# Patient Record
Sex: Male | Born: 1970
Health system: Southern US, Community
[De-identification: ages and names within clinical notes are randomized; demographics above are authoritative.]

## PROBLEM LIST (undated history)

## (undated) DIAGNOSIS — I428 Other cardiomyopathies: Secondary | ICD-10-CM

## (undated) DIAGNOSIS — G4730 Sleep apnea, unspecified: Secondary | ICD-10-CM

## (undated) DIAGNOSIS — E119 Type 2 diabetes mellitus without complications: Secondary | ICD-10-CM

## (undated) DIAGNOSIS — I5022 Chronic systolic (congestive) heart failure: Secondary | ICD-10-CM

## (undated) DIAGNOSIS — E785 Hyperlipidemia, unspecified: Secondary | ICD-10-CM

## (undated) DIAGNOSIS — I10 Essential (primary) hypertension: Secondary | ICD-10-CM

## (undated) HISTORY — DX: Other cardiomyopathies: I42.8

## (undated) HISTORY — DX: Chronic systolic (congestive) heart failure: I50.22

---

## 2014-04-20 DIAGNOSIS — Z86718 Personal history of other venous thrombosis and embolism: Secondary | ICD-10-CM | POA: Insufficient documentation

## 2014-05-20 DIAGNOSIS — I8393 Asymptomatic varicose veins of bilateral lower extremities: Secondary | ICD-10-CM | POA: Insufficient documentation

## 2018-11-23 ENCOUNTER — Emergency Department (HOSPITAL_BASED_OUTPATIENT_CLINIC_OR_DEPARTMENT_OTHER): Payer: BLUE CROSS/BLUE SHIELD

## 2018-11-23 ENCOUNTER — Emergency Department (HOSPITAL_COMMUNITY)
Admission: EM | Admit: 2018-11-23 | Discharge: 2018-11-23 | Disposition: A | Discharge status: Home / Self Care | Payer: BLUE CROSS/BLUE SHIELD | Attending: Emergency Medicine | Admitting: Emergency Medicine

## 2018-11-23 DIAGNOSIS — E119 Type 2 diabetes mellitus without complications: Secondary | ICD-10-CM | POA: Insufficient documentation

## 2018-11-23 DIAGNOSIS — R600 Localized edema: Secondary | ICD-10-CM | POA: Diagnosis not present

## 2018-11-23 DIAGNOSIS — M79604 Pain in right leg: Secondary | ICD-10-CM | POA: Insufficient documentation

## 2018-11-23 DIAGNOSIS — I10 Essential (primary) hypertension: Secondary | ICD-10-CM | POA: Diagnosis not present

## 2018-11-23 DIAGNOSIS — M7989 Other specified soft tissue disorders: Secondary | ICD-10-CM

## 2018-11-23 NOTE — Discharge Instructions (Signed)
Dear Gary Jacobs  You came to [place] with right leg pain. We have determined this was caused by varicose vein and trauma to the leg. Here are our recommendations for you at discharge:  Please follow up with your primary care provider. Consider wearing compression stockings to reduce your swelling.  Thank you for choosing [facility].

## 2018-11-23 NOTE — Progress Notes (Signed)
Right lower extremity venous duplex completed.  Refer to "CV Proc" under chart review to view preliminary results.  11/23/2018 4:14 PM Gertie Fey, MHA, RVT, RDCS, RDMS

## 2018-11-23 NOTE — ED Provider Notes (Signed)
[facility] EMERGENCY DEPARTMENT Provider Note   CSN: [id number] Arrival date & time: 11/23/18  1511    History   Chief Complaint Chief Complaint  Patient presents with  . Leg Pain    HPI Gary Jacobs is a 48 y.o. male w/ PMH of T2DM, Obesity, HTN and hx of DVT presenting with left leg pain. He states he was in his usual state of health until 2 weeks ago when he began to have left leg pain after bumping it on furniture. He states the pain was not significant but he was concerned due to the swelling not decreasing after 2 weeks as he had a history of DVT 7 years ago with similar presentation. He states he was on couple months of Coumadin at that time but is no longer on anticoagulation. 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 Lives in [place] but operates out of [place]. Denies any smoking history.    No past medical history on file.  There are no active problems to display for this patient.   Home Medications    Prior to Admission medications   Not on File   Family History No family history on file.  Social History Social History   Tobacco Use  . Smoking status: Not on file  Substance Use Topics  . Alcohol use: Not on file  . Drug use: Not on file     Allergies   Patient has no allergy information on record.   Review of Systems Review of Systems  Constitutional: Negative for chills, fatigue and fever.  Respiratory: Negative for cough, chest tightness and shortness of breath.   Cardiovascular: Positive for leg swelling. Negative for chest pain and palpitations.  Gastrointestinal: Negative for constipation, diarrhea, nausea and vomiting.  Genitourinary: Negative for dysuria, frequency and urgency.  Musculoskeletal: Negative for joint swelling.  Neurological: Negative for dizziness, weakness, light-headedness, numbness and headaches.     Physical Exam Updated Vital Signs BP 117/83   Pulse 90    Temp 97.8 F (36.6 C) (Oral)   Resp 16   SpO2 98%   Physical Exam Constitutional:      General: He is not in acute distress.    Appearance: He is obese.  HENT:     Head: Normocephalic and atraumatic.     Nose: Nose normal. No congestion or rhinorrhea.     Mouth/Throat:     Mouth: Mucous membranes are moist.     Pharynx: Oropharynx is clear. No oropharyngeal exudate.  Eyes:     Extraocular Movements: Extraocular movements intact.     Conjunctiva/sclera: Conjunctivae normal.     Pupils: Pupils are equal, round, and reactive to light.  Cardiovascular:     Rate and Rhythm: Normal rate and regular rhythm.     Pulses: Normal pulses.     Heart sounds: Normal heart sounds. No murmur.  Pulmonary:     Effort: Pulmonary effort is normal.     Breath sounds: Normal breath sounds. No wheezing or rales.  Abdominal:     General: Abdomen is flat. Bowel sounds are normal. There is no distension.     Palpations: Abdomen is soft.     Tenderness: There is no abdominal tenderness.  Musculoskeletal: Normal range of motion.     Right lower leg: Edema (1+ to mid shin, + varicose veins, significantly larger in diameter than left lower extremity) present.     Left lower leg: No edema.  Skin:  General: Skin is warm and dry.  Neurological:     Mental Status: He is alert.      ED Treatments / Results  Labs (all labs ordered are listed, but only abnormal results are displayed) Labs Reviewed - No data to display  EKG None  Radiology Vas [place] Lower Extremity Venous (dvt) (only [facility])  Result Date: 11/23/2018  Lower Venous Study Indications: Swelling.  Performing Technologist: Gertie Fey MHA, RDMS, RVT, RDCS  Examination Guidelines: A complete evaluation includes B-mode imaging, spectral Doppler, color Doppler, and power Doppler as needed of all accessible portions of each vessel. Bilateral testing is considered an integral part of a complete examination. Limited examinations for  reoccurring indications may be performed as noted.  Right Venous Findings: +---------+---------------+---------+-----------+----------+--------------+          CompressibilityPhasicitySpontaneityPropertiesSummary        +---------+---------------+---------+-----------+----------+--------------+ CFV      Full                                                        +---------+---------------+---------+-----------+----------+--------------+ SFJ      Full                                                        +---------+---------------+---------+-----------+----------+--------------+ FV Prox  Full                                                        +---------+---------------+---------+-----------+----------+--------------+ FV Mid   Full                                                        +---------+---------------+---------+-----------+----------+--------------+ FV DistalFull                                                        +---------+---------------+---------+-----------+----------+--------------+ POP      Full                                                        +---------+---------------+---------+-----------+----------+--------------+ PTV      Full                                                        +---------+---------------+---------+-----------+----------+--------------+ PERO  Not visualized +---------+---------------+---------+-----------+----------+--------------+ Varicose veins were visualized in the proximal medial calf without evidence of thrombosis.  Summary: Right: There is no evidence of deep vein thrombosis in the lower extremity. However, portions of this examination were limited- see technologist comments above. No cystic structure found in the popliteal fossa.  *See table(s) above for measurements and observations. Electronically signed by Sherald Hess MD on 11/23/2018 at  4:29:49 PM.    Final     Procedures Procedures (including critical care time)  Medications Ordered in ED Medications - No data to display   Initial Impression / Assessment and Plan / ED Course  I have reviewed the triage vital signs and the nursing notes.  Pertinent labs & imaging results that were available during my care of the patient were reviewed by me and considered in my medical decision making (see chart for details).    Gary Jacobs is a 48 yo M w/ hx of prior DVT presenting with unilateral leg swelling. High risk for DVT (Wells score: 4)due to prior hx and occupation as Naval architect. Other differential includes soft-tissue injury due to trauma and/or varicose veins. F/u with DVT ultrasound  Final Clinical Impressions(s) / ED Diagnoses   Final diagnoses:  Right leg pain    Lower extremity doppler showing no DVT. Unilateral swelling can be attributed to soft tissue injury after trauma with chronic varicose veins. Will discharge home.  ED Discharge Orders    None       Theotis Barrio, MD 11/23/18 1815    Tilden Fossa, MD 11/24/18 [id number]

## 2018-11-23 NOTE — ED Triage Notes (Signed)
Pt here from home with c/o right lower leg pain pt has had a clot in that calf in the past , no pain , leg is warm

## 2019-07-31 ENCOUNTER — Inpatient Hospital Stay (HOSPITAL_COMMUNITY)
Admission: EM | Admit: 2019-07-31 | Discharge: 2019-08-06 | DRG: 286 | Disposition: A | Discharge status: Home / Self Care | Payer: BC Managed Care – PPO | Attending: Internal Medicine | Admitting: Internal Medicine

## 2019-07-31 ENCOUNTER — Other Ambulatory Visit: Payer: Self-pay

## 2019-07-31 ENCOUNTER — Encounter (HOSPITAL_COMMUNITY): Payer: Self-pay

## 2019-07-31 ENCOUNTER — Emergency Department (HOSPITAL_COMMUNITY): Payer: BC Managed Care – PPO

## 2019-07-31 DIAGNOSIS — I10 Essential (primary) hypertension: Secondary | ICD-10-CM | POA: Diagnosis not present

## 2019-07-31 DIAGNOSIS — E785 Hyperlipidemia, unspecified: Secondary | ICD-10-CM | POA: Diagnosis present

## 2019-07-31 DIAGNOSIS — R739 Hyperglycemia, unspecified: Secondary | ICD-10-CM

## 2019-07-31 DIAGNOSIS — I5023 Acute on chronic systolic (congestive) heart failure: Secondary | ICD-10-CM | POA: Diagnosis present

## 2019-07-31 DIAGNOSIS — I2510 Atherosclerotic heart disease of native coronary artery without angina pectoris: Secondary | ICD-10-CM | POA: Diagnosis present

## 2019-07-31 DIAGNOSIS — Z79899 Other long term (current) drug therapy: Secondary | ICD-10-CM | POA: Diagnosis not present

## 2019-07-31 DIAGNOSIS — N189 Chronic kidney disease, unspecified: Secondary | ICD-10-CM | POA: Diagnosis present

## 2019-07-31 DIAGNOSIS — Z7984 Long term (current) use of oral hypoglycemic drugs: Secondary | ICD-10-CM

## 2019-07-31 DIAGNOSIS — Z86718 Personal history of other venous thrombosis and embolism: Secondary | ICD-10-CM | POA: Diagnosis not present

## 2019-07-31 DIAGNOSIS — I82401 Acute embolism and thrombosis of unspecified deep veins of right lower extremity: Secondary | ICD-10-CM | POA: Diagnosis not present

## 2019-07-31 DIAGNOSIS — G4733 Obstructive sleep apnea (adult) (pediatric): Secondary | ICD-10-CM | POA: Diagnosis present

## 2019-07-31 DIAGNOSIS — I5021 Acute systolic (congestive) heart failure: Secondary | ICD-10-CM | POA: Diagnosis not present

## 2019-07-31 DIAGNOSIS — I130 Hypertensive heart and chronic kidney disease with heart failure and stage 1 through stage 4 chronic kidney disease, or unspecified chronic kidney disease: Principal | ICD-10-CM | POA: Diagnosis present

## 2019-07-31 DIAGNOSIS — Z6841 Body Mass Index (BMI) 40.0 and over, adult: Secondary | ICD-10-CM

## 2019-07-31 DIAGNOSIS — I361 Nonrheumatic tricuspid (valve) insufficiency: Secondary | ICD-10-CM | POA: Diagnosis not present

## 2019-07-31 DIAGNOSIS — R7989 Other specified abnormal findings of blood chemistry: Secondary | ICD-10-CM | POA: Diagnosis not present

## 2019-07-31 DIAGNOSIS — R778 Other specified abnormalities of plasma proteins: Secondary | ICD-10-CM | POA: Diagnosis not present

## 2019-07-31 DIAGNOSIS — I509 Heart failure, unspecified: Secondary | ICD-10-CM

## 2019-07-31 DIAGNOSIS — I083 Combined rheumatic disorders of mitral, aortic and tricuspid valves: Secondary | ICD-10-CM | POA: Diagnosis present

## 2019-07-31 DIAGNOSIS — Z20828 Contact with and (suspected) exposure to other viral communicable diseases: Secondary | ICD-10-CM | POA: Diagnosis present

## 2019-07-31 DIAGNOSIS — G4730 Sleep apnea, unspecified: Secondary | ICD-10-CM

## 2019-07-31 DIAGNOSIS — E1169 Type 2 diabetes mellitus with other specified complication: Secondary | ICD-10-CM

## 2019-07-31 DIAGNOSIS — E1122 Type 2 diabetes mellitus with diabetic chronic kidney disease: Secondary | ICD-10-CM | POA: Diagnosis present

## 2019-07-31 DIAGNOSIS — I248 Other forms of acute ischemic heart disease: Secondary | ICD-10-CM | POA: Diagnosis present

## 2019-07-31 DIAGNOSIS — N179 Acute kidney failure, unspecified: Secondary | ICD-10-CM | POA: Diagnosis present

## 2019-07-31 DIAGNOSIS — M7989 Other specified soft tissue disorders: Secondary | ICD-10-CM | POA: Diagnosis not present

## 2019-07-31 DIAGNOSIS — G4739 Other sleep apnea: Secondary | ICD-10-CM | POA: Diagnosis not present

## 2019-07-31 DIAGNOSIS — R0602 Shortness of breath: Secondary | ICD-10-CM | POA: Diagnosis not present

## 2019-07-31 DIAGNOSIS — Z8249 Family history of ischemic heart disease and other diseases of the circulatory system: Secondary | ICD-10-CM

## 2019-07-31 DIAGNOSIS — I340 Nonrheumatic mitral (valve) insufficiency: Secondary | ICD-10-CM | POA: Diagnosis not present

## 2019-07-31 DIAGNOSIS — E1165 Type 2 diabetes mellitus with hyperglycemia: Secondary | ICD-10-CM | POA: Diagnosis present

## 2019-07-31 HISTORY — DX: Essential (primary) hypertension: I10

## 2019-07-31 HISTORY — DX: Hyperlipidemia, unspecified: E78.5

## 2019-07-31 HISTORY — DX: Type 2 diabetes mellitus without complications: E11.9

## 2019-07-31 HISTORY — DX: Sleep apnea, unspecified: G47.30

## 2019-07-31 LAB — CBC
HCT: 41.3 % (ref 39.0–52.0)
HCT: 42 % (ref 39.0–52.0)
Hemoglobin: 12.7 g/dL — ABNORMAL LOW (ref 13.0–17.0)
Hemoglobin: 13 g/dL (ref 13.0–17.0)
MCH: 26.8 pg (ref 26.0–34.0)
MCH: 26.5 pg (ref 26.0–34.0)
MCHC: 30.8 g/dL (ref 30.0–36.0)
MCHC: 31 g/dL (ref 30.0–36.0)
MCV: 87.1 fL (ref 80.0–100.0)
MCV: 85.7 fL (ref 80.0–100.0)
Platelets: 248 10*3/uL (ref 150–400)
Platelets: 243 10*3/uL (ref 150–400)
RBC: 4.74 MIL/uL (ref 4.22–5.81)
RBC: 4.9 MIL/uL (ref 4.22–5.81)
RDW: 15.5 % (ref 11.5–15.5)
RDW: 15.6 % — ABNORMAL HIGH (ref 11.5–15.5)
WBC: 6.5 10*3/uL (ref 4.0–10.5)
WBC: 7.9 10*3/uL (ref 4.0–10.5)
nRBC: 0 % (ref 0.0–0.2)
nRBC: 0 % (ref 0.0–0.2)

## 2019-07-31 LAB — BASIC METABOLIC PANEL
Anion gap: 10 (ref 5–15)
BUN: 15 mg/dL (ref 6–20)
CO2: 25 mmol/L (ref 22–32)
Calcium: 9.1 mg/dL (ref 8.9–10.3)
Chloride: 102 mmol/L (ref 98–111)
Creatinine, Ser: 1.62 mg/dL — ABNORMAL HIGH (ref 0.61–1.24)
GFR calc Af Amer: 58 mL/min — ABNORMAL LOW (ref >60)
GFR calc non Af Amer: 50 mL/min — ABNORMAL LOW (ref >60)
Glucose, Bld: 282 mg/dL — ABNORMAL HIGH (ref 70–99)
Potassium: 4.7 mmol/L (ref 3.5–5.1)
Sodium: 137 mmol/L (ref 135–145)

## 2019-07-31 LAB — TROPONIN I (HIGH SENSITIVITY)
Troponin I (High Sensitivity): 186 ng/L (ref <18)
Troponin I (High Sensitivity): 169 ng/L (ref <18)

## 2019-07-31 LAB — SARS CORONAVIRUS 2 (TAT 6-24 HRS): SARS Coronavirus 2: NEGATIVE

## 2019-07-31 LAB — BRAIN NATRIURETIC PEPTIDE: B Natriuretic Peptide: 549 pg/mL — ABNORMAL HIGH (ref 0.0–100.0)

## 2019-07-31 MED ORDER — IOHEXOL 350 MG/ML SOLN
100.0000 mL | Freq: Once | INTRAVENOUS | Status: AC | PRN
  Administered 2019-07-31: 100 mL via INTRAVENOUS

## 2019-07-31 MED ORDER — SODIUM CHLORIDE 0.9% FLUSH
3.0000 mL | Freq: Once | INTRAVENOUS | Status: AC
  Administered 2019-07-31: 3 mL via INTRAVENOUS

## 2019-07-31 MED ORDER — ALBUTEROL SULFATE (2.5 MG/3ML) 0.083% IN NEBU: 5.0000 mg | INHALATION_SOLUTION | Freq: Once | RESPIRATORY_TRACT | Status: DC

## 2019-07-31 MED ORDER — SODIUM CHLORIDE 0.9 % IV SOLN: 250.0000 mL | INTRAVENOUS | Status: DC | PRN

## 2019-07-31 MED ORDER — FUROSEMIDE 10 MG/ML IJ SOLN
20.0000 mg | Freq: Once | INTRAMUSCULAR | Status: AC
  Administered 2019-07-31: 20 mg via INTRAVENOUS
  Filled 2019-07-31: qty 2

## 2019-07-31 MED ORDER — ENOXAPARIN SODIUM 80 MG/0.8ML ~~LOC~~ SOLN
80.0000 mg | SUBCUTANEOUS | Status: DC
  Administered 2019-08-01 – 2019-08-03 (×3): 80 mg via SUBCUTANEOUS
  Filled 2019-07-31 (×4): qty 0.8

## 2019-07-31 MED ORDER — SODIUM CHLORIDE 0.9% FLUSH
3.0000 mL | Freq: Two times a day (BID) | INTRAVENOUS | Status: DC
  Administered 2019-07-31 – 2019-08-03 (×6): 3 mL via INTRAVENOUS

## 2019-07-31 MED ORDER — ASPIRIN EC 81 MG PO TBEC
81.0000 mg | DELAYED_RELEASE_TABLET | Freq: Every day | ORAL | Status: DC
  Administered 2019-07-31 – 2019-08-06 (×7): 81 mg via ORAL
  Filled 2019-07-31 (×7): qty 1

## 2019-07-31 MED ORDER — SODIUM CHLORIDE 0.9% FLUSH: 3.0000 mL | INTRAVENOUS | Status: DC | PRN

## 2019-07-31 MED ORDER — LISINOPRIL 10 MG PO TABS
10.0000 mg | ORAL_TABLET | Freq: Every day | ORAL | Status: DC
  Administered 2019-07-31 – 2019-08-02 (×3): 10 mg via ORAL
  Filled 2019-07-31 (×3): qty 1

## 2019-07-31 MED ORDER — FUROSEMIDE 10 MG/ML IJ SOLN
40.0000 mg | Freq: Two times a day (BID) | INTRAMUSCULAR | Status: DC
  Administered 2019-08-01: 08:00:00 40 mg via INTRAVENOUS
  Filled 2019-07-31: qty 4

## 2019-07-31 MED ORDER — ACETAMINOPHEN 325 MG PO TABS: 650.0000 mg | ORAL_TABLET | ORAL | Status: DC | PRN

## 2019-07-31 MED ORDER — ONDANSETRON HCL 4 MG/2ML IJ SOLN: 4.0000 mg | Freq: Four times a day (QID) | INTRAMUSCULAR | Status: DC | PRN

## 2019-07-31 MED ORDER — ENOXAPARIN SODIUM 40 MG/0.4ML ~~LOC~~ SOLN: 40.0000 mg | SUBCUTANEOUS | Status: DC

## 2019-07-31 NOTE — ED Provider Notes (Signed)
[facility] EMERGENCY DEPARTMENT Provider Note   CSN: [id number] Arrival date & time: 07/31/19  1207     History   Chief Complaint Chief Complaint  Patient presents with   Shortness of Breath    HPI Gary Jacobs is a 48 y.o. male with past medical history of diabetes, hypertension, obesity presenting to the ED with a chief complaint of shortness of breath.  He was diagnosed with a sprain in his abdominal wall musculature on 06/11/2019.  A few days after that, began having shortness of breath worse with ambulation and laying down.  He reports of bilateral lower extremity edema, right greater than left.  He had a chest x-ray and EKG done 4 days ago at his new [facility] clinic which to his knowledge, were unremarkable.  His doctor sent him to the ED today concern for blood clot.  States that he has had a DVT in his right lower extremity about 13 years ago.  He is not currently anticoagulated.  He does not wear supplemental oxygen at home.  Reports a dry persistent cough.  Denies fevers, chest pain, history of CHF, diuretic use, abdominal pain, vomiting, diarrhea or sick contacts with similar symptoms.     HPI  Past Medical History:  Diagnosis Date   Diabetes mellitus without complication ([place])    Hypertension    Sleep apnea     There are no active problems to display for this patient.    Home Medications    Prior to Admission medications   Medication Sig Start Date End Date Taking? Authorizing Provider  atorvastatin (LIPITOR) 40 MG tablet Take 40 mg by mouth at bedtime.   Yes [provider]  Dulaglutide (TRULICITY) 1.5 DD/2.2GU SOPN Inject 1.5 mg into the skin once a week.   Yes [provider]  lisinopril (ZESTRIL) 10 MG tablet Take 10 mg by mouth daily.   Yes [provider]  metFORMIN (GLUCOPHAGE) 500 MG tablet Take 500 mg by mouth 2 (two) times daily with a meal.   Yes [provider]  Multiple Vitamin (MULTIVITAMIN  WITH MINERALS) TABS tablet Take 1 tablet by mouth daily.   Yes [provider]  Vitamin D, Ergocalciferol, (DRISDOL) 1.25 MG (50000 UT) CAPS capsule Take 50,000 Units by mouth every 7 (seven) days.   Yes [provider]    Family History No family history on file.  Social History Social History   Tobacco Use   Smoking status: Never Smoker   Smokeless tobacco: Never Used  Substance Use Topics   Alcohol use: Not Currently   Drug use: Not Currently     Allergies   Patient has no known allergies.   Review of Systems Review of Systems  Constitutional: Negative for appetite change, chills and fever.  HENT: Negative for ear pain, rhinorrhea, sneezing and sore throat.   Eyes: Negative for photophobia and visual disturbance.  Respiratory: Positive for shortness of breath. Negative for cough, chest tightness and wheezing.   Cardiovascular: Positive for leg swelling. Negative for chest pain and palpitations.  Gastrointestinal: Negative for abdominal pain, blood in stool, constipation, diarrhea, nausea and vomiting.  Genitourinary: Negative for dysuria, hematuria and urgency.  Musculoskeletal: Negative for myalgias.  Skin: Negative for rash.  Neurological: Negative for dizziness, weakness and light-headedness.     Physical Exam Updated Vital Signs BP 123/85    Pulse (!) 109    Temp 98.2 F (36.8 C) (Oral)    Resp (!) 24    SpO2  95%   Physical Exam Vitals signs and nursing note reviewed.  Constitutional:      General: He is not in acute distress.    Appearance: He is well-developed. He is obese.  HENT:     Head: Normocephalic and atraumatic.     Nose: Nose normal.  Eyes:     General: No scleral icterus.       Left eye: No discharge.     Conjunctiva/sclera: Conjunctivae normal.  Neck:     Musculoskeletal: Normal range of motion and neck supple.  Cardiovascular:     Rate and Rhythm: Regular rhythm. Tachycardia present.     Heart sounds: Normal heart  sounds. No murmur. No friction rub. No gallop.   Pulmonary:     Effort: Pulmonary effort is normal. Tachypnea present. No respiratory distress.     Breath sounds: Normal breath sounds.  Abdominal:     General: Bowel sounds are normal. There is no distension.     Palpations: Abdomen is soft.     Tenderness: There is no abdominal tenderness. There is no guarding.  Musculoskeletal: Normal range of motion.     Right lower leg: He exhibits tenderness.     Left lower leg: He exhibits tenderness.     Comments: Pitting edema in bilateral lower extremities R >L.  No erythema noted.  2+ DP pulse palpated.  Skin:    General: Skin is warm and dry.     Findings: No rash.  Neurological:     Mental Status: He is alert.     Motor: No abnormal muscle tone.     Coordination: Coordination normal.      ED Treatments / Results  Labs (all labs ordered are listed, but only abnormal results are displayed) Labs Reviewed  BASIC METABOLIC PANEL - Abnormal; Notable for the following components:      Result Value   Glucose, Bld 282 (*)    Creatinine, Ser 1.62 (*)    GFR calc non Af Amer 50 (*)    GFR calc Af Amer 58 (*)    All other components within normal limits  CBC - Abnormal; Notable for the following components:   Hemoglobin 12.7 (*)    All other components within normal limits  BRAIN NATRIURETIC PEPTIDE - Abnormal; Notable for the following components:   B Natriuretic Peptide 549.0 (*)    All other components within normal limits  TROPONIN I (HIGH SENSITIVITY) - Abnormal; Notable for the following components:   Troponin I (High Sensitivity) 186 (*)    All other components within normal limits  TROPONIN I (HIGH SENSITIVITY) - Abnormal; Notable for the following components:   Troponin I (High Sensitivity) 169 (*)    All other components within normal limits  SARS CORONAVIRUS 2 (TAT 6-24 HRS)    EKG EKG Interpretation  Date/Time:  Thursday July 31 2019 12:20:49 EST Ventricular Rate:    118 PR Interval:  156 QRS Duration: 78 QT Interval:  318 QTC Calculation: 445 R Axis:   36 Text Interpretation: Sinus tachycardia Anterior infarct , age undetermined Abnormal ECG No STEMI Confirmed by Alona Bene [phone]) on 07/31/2019 2:46:57 PM   Radiology Dg Chest 2 View  Result Date: 07/31/2019 CLINICAL DATA:  Shortness of breath. EXAM: CHEST - 2 VIEW COMPARISON:  . FINDINGS: Cardiomegaly. Mild pulmonary venous congestion bilateral interstitial prominence noted. CHF could present in this fashion. Right upper lobe alveolar infiltrate. This could represent focal pulmonary edema and/or pneumonia. No pleural effusion or pneumothorax. Degenerative change  thoracic spine. IMPRESSION: 1. Cardiomegaly with pulmonary venous congestion and bilateral interstitial prominence suggesting CHF. 2. Right upper lobe alveolar infiltrate noted. This could represent focal edema and or pneumonia. Electronically Signed   By: Maisie Fushomas  Register   On: 07/31/2019 13:00   Ct Angio Chest Pe W/cm &/or Wo Cm  Result Date: 07/31/2019 CLINICAL DATA:  PE suspected, high pretest prob. Shortness of breath. EXAM: CT ANGIOGRAPHY CHEST WITH CONTRAST TECHNIQUE: Multidetector CT imaging of the chest was performed using the standard protocol during bolus administration of intravenous contrast. Multiplanar CT image reconstructions and MIPs were obtained to evaluate the vascular anatomy. CONTRAST:  100mL OMNIPAQUE IOHEXOL 350 MG/ML SOLN COMPARISON:  Radiograph earlier this day. FINDINGS: Cardiovascular: There are no filling defects within the central pulmonary arteries to suggest pulmonary embolus. Evaluation is diagnostic to the proximal segmental level. Distal segmental and subsegmental branches cannot be assessed due to contrast bolus timing and soft tissue attenuation from habitus. Mild multi chamber cardiomegaly. Minimal contrast refluxes into the IVC and hepatic veins. Thoracic aorta is normal in caliber. Cannot assess for  dissection given phase of contrast tailored to pulmonary artery evaluation. Mediastinum/Nodes: Calcified subcarinal lymph nodes. No noncalcified adenopathy. Heterogeneous enlargement of the thyroid gland calcifications on the left extending substernal. No dominant nodule, however evaluation is technically limited. Esophagus is decompressed. Lungs/Pleura: Nodular of bilateral ground-glass opacities throughout both lungs, slightly more prominent on the right. Findings slightly more prominent in the upper lobes. Mild smooth septal thickening. Trachea and mainstem bronchi are patent. No solid pulmonary mass. Small right pleural effusion. Upper Abdomen: Minimal contrast refluxing into the hepatic veins and IVC. No other acute finding. Musculoskeletal: There are no acute or suspicious osseous abnormalities. Review of the MIP images confirms the above findings. IMPRESSION: 1. No central pulmonary embolus to the proximal segmental level. Distal segmental and subsegmental branches cannot be assessed due to contrast bolus timing and soft tissue attenuation from habitus. 2. Nodular bilateral ground-glass opacities throughout both lungs, slightly more prominent in the upper lobes. Findings may be due to atypical infection (including but not classic for COVID-19) or pulmonary edema. In the setting of septal thickening, cardiomegaly, and small right pleural effusion, pulmonary edema is favored. 3. Mild multi chamber cardiomegaly. Minimal contrast refluxing into the IVC and hepatic veins, suggesting elevated right heart pressures. 4. Heterogeneously enlarged thyroid gland. Left thyroid calcification. No dominant nodule is seen, however evaluation is technically limited due to soft tissue attenuation from habitus. Consider further evaluation with thyroid ultrasound on a nonemergent basis. Electronically Signed   By: Narda RutherfordMelanie  Sanford M.D.   On: 07/31/2019 18:11    Procedures .Critical Care Performed by: Dietrich PatesKhatri, Tollie [place],  PA-C Authorized by: Dietrich PatesKhatri, Lonisha Bobby, PA-C   Critical care provider statement:    Critical care time (minutes):  35   Critical care time was exclusive of:  Separately billable procedures and treating other patients   Critical care was necessary to treat or prevent imminent or life-threatening deterioration of the following conditions:  Cardiac failure, respiratory failure, circulatory failure and dehydration   Critical care was time spent personally by me on the following activities:  Discussions with consultants, development of treatment plan with patient or surrogate, evaluation of patient's response to treatment, examination of patient, review of old charts, pulse oximetry, re-evaluation of patient's condition, ordering and review of radiographic studies, ordering and review of laboratory studies and ordering and performing treatments and interventions   I assumed direction of critical care for this patient from another provider in  my specialty: no     (including critical care time)  Medications Ordered in ED Medications  albuterol (PROVENTIL) (2.5 MG/3ML) 0.083% nebulizer solution 5 mg (5 mg Nebulization Not Given 07/31/19 1520)  furosemide (LASIX) injection 20 mg (has no administration in time range)  sodium chloride flush (NS) 0.9 % injection 3 mL (3 mLs Intravenous Given 07/31/19 1521)  iohexol (OMNIPAQUE) 350 MG/ML injection 100 mL (100 mLs Intravenous Contrast Given 07/31/19 1730)     Initial Impression / Assessment and Plan / ED Course  I have reviewed the triage vital signs and the nursing notes.  Pertinent labs & imaging results that were available during my care of the patient were reviewed by me and considered in my medical decision making (see chart for details).        Gary Jacobs was evaluated in Emergency Department on 07/31/19  for the symptoms described in the history of present illness. He/she was evaluated in the context of the global COVID-19 pandemic, which  necessitated consideration that the patient might be at risk for infection with the SARS-CoV-2 virus that causes COVID-19. Institutional protocols and algorithms that pertain to the evaluation of patients at risk for COVID-19 are in a state of rapid change based on information released by regulatory bodies including the CDC and federal and state organizations. These policies and algorithms were followed during the patient's care in the ED.  48 year old male with past medical history of diabetes, hypertension, obesity presenting to the ED with a chief complaint of shortness of breath.  For the past 2 weeks has progressive worsening shortness of breath worse with ambulation and laying down.  Reports bilateral lower extremity edema, right greater than left.  He was told that his past 2 chest x-rays in the past week as well as EKG done were unremarkable.  History of DVT in his right lower extremity about 13 years ago and is not currently anticoagulated.  On exam patient tachycardic, tachypneic.  Oxygen saturations 95 to 96% on room air.  Work-up significant for initial troponin of 186, delta of 169.  EKG shows sinus tachycardia.  Chest imaging reveals no PE, does show groundglass opacities concerning for atypical infection or pulmonary edema.  BNP elevated to 549.  Covid test is pending.  Feel that his symptoms are due to new onset CHF.  Patient given IV Lasix and will need to be admitted for further evaluation and management.  Final Clinical Impressions(s) / ED Diagnoses   Final diagnoses:  Acute congestive heart failure, unspecified heart failure type [facility])    ED Discharge Orders    None      Portions of this note were generated with Dragon dictation software. Dictation errors may occur despite best attempts at proofreading.    Dietrich Pates, PA-C 07/31/19 1900    Gerhard Munch, MD 08/01/19 0000

## 2019-07-31 NOTE — Progress Notes (Signed)
Placed patient on CPAP for the night via auto-mode.  

## 2019-07-31 NOTE — H&P (Signed)
History and Physical  Gary Jacobs IWL:[id number] DOB: Jun 10, 1971 DOA: 07/31/2019  Referring physician: Gerhard Jacobs PCP: Gary Damme, MD  Patient coming from: Home  Chief Complaint: Shortness of breath  HPI: Gary Jacobs is a 48 y.o. male with medical history significant for type 2 diabetes mellitus, hypertension, hyperlipidemia, sleep apnea on CPAP at night and obesity who presents to the emergency department due to progressive and worsening shortness of breath that has been ongoing for about 2 to 3 weeks, this was associated with increased leg swelling.  Shortness of breath worsens with ambulation.  Patient states that he had abdominal sprain from work several weeks ago and since he has increased pain in the abdomen on ambulation, he initially thought this was due to the abdominal sprain. Chest x-ray done at PCPs office about 4 days ago was unremarkable per patient.  Patient also thought that increased leg swelling may be related to history of DVT of right lower extremity about 13 years ago (currently not on anticoagulation).  Patient decided to come to the ED for further evaluation and management due to persistent shortness of breath and bilateral leg swelling.  ED Course:  In the emergency department, patient was noted to be tachycardic and tachypneic, BP was 133/97 and other vital signs were within the normal range.  Work-up in the ED showed normal CBC, hyperglycemia and elevated creatinine at 1.6.  COVID-19 test was negative.  B chest with contrast showed no central pulmonary embolus and nodular bilateral groundglass opacities throughout both lungs, slightly more prominent in the upper lobes suspected to be due to atypical infection or pulmonary edema (more favored)..  Chest x-ray showed cardiomegaly with pulmonary venous congestion and bilateral interstitial prominence suggesting CHF.  Lasix was given and hospitalist was asked to admit.  For further evaluation and management.  Review  of Systems: Review of Systems  Constitutional: Negative for chills and fever.  HENT: Negative for ear pain and sore throat.   Eyes: Negative for pain and visual disturbance.  Respiratory: Positive for shortness of breath.  Negative for cough.  Cardiovascular: Positive for leg swelling.  Negative for chest pain and palpitations.  Gastrointestinal: Negative for abdominal pain and vomiting.  Endocrine: Negative for polyphagia and polyuria.  Genitourinary: Negative for  dysuria, enuresis, hematuria Musculoskeletal: Negative for arthralgias and back pain.  Skin: Negative for color change and rash.  Allergic/Immunologic: Negative for immunocompromised state.  Neurological: Negative for tremors, syncope, speech difficulty, weakness, light-headedness and headaches.  Hematological: Does not bruise/bleed easily.    Past Medical History:  Diagnosis Date  . Diabetes mellitus without complication (HCC)   . Hypertension   . Sleep apnea     Social History:  reports that he has never smoked. He has never used smokeless tobacco. He reports previous alcohol use. He reports previous drug use.   No Known Allergies  No family history on file.    Prior to Admission medications   Medication Sig Start Date End Date Taking? Authorizing Provider  atorvastatin (LIPITOR) 40 MG tablet Take 40 mg by mouth at bedtime.   Yes [provider]  Dulaglutide (TRULICITY) 1.5 MG/0.5ML SOPN Inject 1.5 mg into the skin once a week.   Yes [provider]  lisinopril (ZESTRIL) 10 MG tablet Take 10 mg by mouth daily.   Yes [provider]  metFORMIN (GLUCOPHAGE) 500 MG tablet Take 500 mg by mouth 2 (two) times daily with a meal.   Yes [provider]  Multiple Vitamin (MULTIVITAMIN WITH MINERALS)  TABS tablet Take 1 tablet by mouth daily.   Yes [provider]  Vitamin D, Ergocalciferol, (DRISDOL) 1.25 MG (50000 UT) CAPS capsule Take 50,000 Units by mouth every 7 (seven) days.    Yes [provider]    Physical Exam: BP 120/89   Pulse (!) 112   Temp 98.2 F (36.8 C) (Oral)   Resp 19   SpO2 96%   . General: 48 y.o. year-old male obese, well developed well nourished in no acute distress.  Alert and oriented x3. Marland Kitchen HEENT: Normocephalic, atraumatic, PERRLA . NECK: Supple, trachea medial . Cardiovascular: Tachycardia.  Regular rate and rhythm with no rubs or gallops.  No thyromegaly or JVD noted. +2 bilateral lower extremity edema. 2/4 pulses in all 4 extremities. Marland Kitchen Respiratory: Tachypnea.  Diffuse crackles on auscultation bilaterally.   . Abdomen: Soft nontender nondistended with normal bowel sounds x4 quadrants. . Muskuloskeletal: +2 edema bilaterally in lower extremities.  No cyanosis or clubbing  . Neuro: CN II-XII intact, strength, sensation, reflexes . Skin: No ulcerative lesions noted or rashes . Psychiatry: Judgement and insight appear normal. Mood is appropriate for condition and setting          Labs on Admission:  Basic Metabolic Panel: Recent Labs  Lab 07/31/19 1247 07/31/19 2326  NA 137  --   K 4.7  --   CL 102  --   CO2 25  --   GLUCOSE 282*  --   BUN 15  --   CREATININE 1.62* 1.49*  CALCIUM 9.1  --    Liver Function Tests: No results for input(s): AST, ALT, ALKPHOS, BILITOT, PROT, ALBUMIN in the last 168 hours. No results for input(s): LIPASE, AMYLASE in the last 168 hours. No results for input(s): AMMONIA in the last 168 hours. CBC: Recent Labs  Lab 07/31/19 1247 07/31/19 2326  WBC 6.5 7.9  HGB 12.7* 13.0  HCT 41.3 42.0  MCV 87.1 85.7  PLT 248 243   Cardiac Enzymes: No results for input(s): CKTOTAL, CKMB, CKMBINDEX, TROPONINI in the last 168 hours.  BNP (last 3 results) Recent Labs    07/31/19 1535  BNP 549.0*    ProBNP (last 3 results) No results for input(s): PROBNP in the last 8760 hours.  CBG: Recent Labs  Lab 08/01/19 0031  GLUCAP 171*    Radiological Exams on Admission: Dg Chest 2 View   Result Date: 07/31/2019 CLINICAL DATA:  Shortness of breath. EXAM: CHEST - 2 VIEW COMPARISON:  . FINDINGS: Cardiomegaly. Mild pulmonary venous congestion bilateral interstitial prominence noted. CHF could present in this fashion. Right upper lobe alveolar infiltrate. This could represent focal pulmonary edema and/or pneumonia. No pleural effusion or pneumothorax. Degenerative change thoracic spine. IMPRESSION: 1. Cardiomegaly with pulmonary venous congestion and bilateral interstitial prominence suggesting CHF. 2. Right upper lobe alveolar infiltrate noted. This could represent focal edema and or pneumonia. Electronically Signed   By: Marcello Moores  Register   On: 07/31/2019 13:00   Ct Angio Chest Pe W/cm &/or Wo Cm  Result Date: 07/31/2019 CLINICAL DATA:  PE suspected, high pretest prob. Shortness of breath. EXAM: CT ANGIOGRAPHY CHEST WITH CONTRAST TECHNIQUE: Multidetector CT imaging of the chest was performed using the standard protocol during bolus administration of intravenous contrast. Multiplanar CT image reconstructions and MIPs were obtained to evaluate the vascular anatomy. CONTRAST:  169mL OMNIPAQUE IOHEXOL 350 MG/ML SOLN COMPARISON:  Radiograph earlier this day. FINDINGS: Cardiovascular: There are no filling defects within the central pulmonary arteries to suggest pulmonary embolus. Evaluation  is diagnostic to the proximal segmental level. Distal segmental and subsegmental branches cannot be assessed due to contrast bolus timing and soft tissue attenuation from habitus. Mild multi chamber cardiomegaly. Minimal contrast refluxes into the IVC and hepatic veins. Thoracic aorta is normal in caliber. Cannot assess for dissection given phase of contrast tailored to pulmonary artery evaluation. Mediastinum/Nodes: Calcified subcarinal lymph nodes. No noncalcified adenopathy. Heterogeneous enlargement of the thyroid gland calcifications on the left extending substernal. No dominant nodule, however evaluation is  technically limited. Esophagus is decompressed. Lungs/Pleura: Nodular of bilateral ground-glass opacities throughout both lungs, slightly more prominent on the right. Findings slightly more prominent in the upper lobes. Mild smooth septal thickening. Trachea and mainstem bronchi are patent. No solid pulmonary mass. Small right pleural effusion. Upper Abdomen: Minimal contrast refluxing into the hepatic veins and IVC. No other acute finding. Musculoskeletal: There are no acute or suspicious osseous abnormalities. Review of the MIP images confirms the above findings. IMPRESSION: 1. No central pulmonary embolus to the proximal segmental level. Distal segmental and subsegmental branches cannot be assessed due to contrast bolus timing and soft tissue attenuation from habitus. 2. Nodular bilateral ground-glass opacities throughout both lungs, slightly more prominent in the upper lobes. Findings may be due to atypical infection (including but not classic for COVID-19) or pulmonary edema. In the setting of septal thickening, cardiomegaly, and small right pleural effusion, pulmonary edema is favored. 3. Mild multi chamber cardiomegaly. Minimal contrast refluxing into the IVC and hepatic veins, suggesting elevated right heart pressures. 4. Heterogeneously enlarged thyroid gland. Left thyroid calcification. No dominant nodule is seen, however evaluation is technically limited due to soft tissue attenuation from habitus. Consider further evaluation with thyroid ultrasound on a nonemergent basis. Electronically Signed   By: Narda RutherfordMelanie  Sanford M.D.   On: 07/31/2019 18:11    EKG: I independently viewed the EKG done and my findings are as followed: Sinus tachycardia at a rate of 118/min  Assessment/Plan Present on Admission: **None**  Principal Problem:   CHF (congestive heart failure) (HCC) Active Problems:   Type 2 diabetes mellitus with hyperlipidemia (HCC)   Essential hypertension   Sleep apnea   Obesity, Class  III, BMI 40-49.9 (morbid obesity) (HCC)   Elevated troponin I level   Hyperglycemia   Elevated serum creatinine   DVT (deep venous thrombosis) (HCC)   New onset CHF Patient complaining of increasing shortness of breath which worsens with ambulation, he presents with bilateral lower extremity edema. CT angiography chest with contrast was suspicious for atypical infection vs pulmonary edema Chest x-ray showed cardiomegaly with pulmonary venous congestion and bilateral interstitial prominence suggesting CHF.  Patient was started on IV Lasix, continue IV Lasix 40 twice daily Continue lisinopril per home regimen Continue total input/output, daily weights and fluid restriction to 1.5L/day Continue Cardiac diet  EKG and Echocardiogram in the morning   Elevated troponin possible secondary to supply demand mismatch Troponin I 186> 169 Continue management as described above  Hyperglycemia secondary to type II days mellitus Continue insulin sliding scale and hypoglycemia protocol  Elevated creatinine level with no known history of CKD Creatinine 1.6, no prior labs for comparison Renally adjust medications, avoid nephrotoxic agents/dehydration/hypotension  Questionable RLE DVT Patient has prior history of right lower extremity DVT of which he was no longer on anticoagulant.  He complained of bilateral leg swelling with RLE > LLE RLE ultrasound in the morning.  Essential hypertension Continue Lasix and lisinopril  Sleep apnea on CPAP at night Continue CPAP  Obesity Patient  will be counseled on weight loss and exercise when more stable.   DVT prophylaxis: Lovenox  Code Status: Full  Family Communication: Wife at bedside (all questions answered to their satisfaction)  Disposition Plan: Inpatient to discharge patient home once clinically stable  Consults called: None  Admission status: Inpatient    Gary Shown MD [facility]  If 7PM-7AM, please contact  night-coverage [email or web address] 08/01/2019, 1:55 AM

## 2019-07-31 NOTE — ED Triage Notes (Signed)
Pt reports he has an abdominal sprain from 9/23, since then the pain has gotten worse. Pt reports he started having SHOB with the pain. Pt also reports BLE edema. Pt went to see his doctor this morning and was sent here for evaluation for a blood clot.

## 2019-08-01 ENCOUNTER — Inpatient Hospital Stay (HOSPITAL_COMMUNITY): Payer: BC Managed Care – PPO

## 2019-08-01 DIAGNOSIS — R778 Other specified abnormalities of plasma proteins: Secondary | ICD-10-CM

## 2019-08-01 DIAGNOSIS — I10 Essential (primary) hypertension: Secondary | ICD-10-CM

## 2019-08-01 DIAGNOSIS — R739 Hyperglycemia, unspecified: Secondary | ICD-10-CM

## 2019-08-01 DIAGNOSIS — I361 Nonrheumatic tricuspid (valve) insufficiency: Secondary | ICD-10-CM

## 2019-08-01 DIAGNOSIS — I5021 Acute systolic (congestive) heart failure: Secondary | ICD-10-CM | POA: Diagnosis present

## 2019-08-01 DIAGNOSIS — E785 Hyperlipidemia, unspecified: Secondary | ICD-10-CM

## 2019-08-01 DIAGNOSIS — G4730 Sleep apnea, unspecified: Secondary | ICD-10-CM

## 2019-08-01 DIAGNOSIS — M7989 Other specified soft tissue disorders: Secondary | ICD-10-CM

## 2019-08-01 DIAGNOSIS — I82409 Acute embolism and thrombosis of unspecified deep veins of unspecified lower extremity: Secondary | ICD-10-CM | POA: Insufficient documentation

## 2019-08-01 DIAGNOSIS — E1169 Type 2 diabetes mellitus with other specified complication: Secondary | ICD-10-CM

## 2019-08-01 DIAGNOSIS — R7989 Other specified abnormal findings of blood chemistry: Secondary | ICD-10-CM

## 2019-08-01 DIAGNOSIS — N179 Acute kidney failure, unspecified: Secondary | ICD-10-CM | POA: Diagnosis present

## 2019-08-01 DIAGNOSIS — I340 Nonrheumatic mitral (valve) insufficiency: Secondary | ICD-10-CM

## 2019-08-01 LAB — COMPREHENSIVE METABOLIC PANEL
ALT: 19 U/L (ref 0–44)
AST: 20 U/L (ref 15–41)
Albumin: 3.4 g/dL — ABNORMAL LOW (ref 3.5–5.0)
Alkaline Phosphatase: 92 U/L (ref 38–126)
Anion gap: 9 (ref 5–15)
BUN: 15 mg/dL (ref 6–20)
CO2: 24 mmol/L (ref 22–32)
Calcium: 9.2 mg/dL (ref 8.9–10.3)
Chloride: 103 mmol/L (ref 98–111)
Creatinine, Ser: 1.52 mg/dL — ABNORMAL HIGH (ref 0.61–1.24)
GFR calc Af Amer: >60 mL/min (ref >60)
GFR calc non Af Amer: 54 mL/min — ABNORMAL LOW (ref >60)
Glucose, Bld: 338 mg/dL — ABNORMAL HIGH (ref 70–99)
Potassium: 4.7 mmol/L (ref 3.5–5.1)
Sodium: 136 mmol/L (ref 135–145)
Total Bilirubin: 1 mg/dL (ref 0.3–1.2)
Total Protein: 6.1 g/dL — ABNORMAL LOW (ref 6.5–8.1)

## 2019-08-01 LAB — CBC
HCT: 40.4 % (ref 39.0–52.0)
Hemoglobin: 12.4 g/dL — ABNORMAL LOW (ref 13.0–17.0)
MCH: 26.3 pg (ref 26.0–34.0)
MCHC: 30.7 g/dL (ref 30.0–36.0)
MCV: 85.6 fL (ref 80.0–100.0)
Platelets: 248 10*3/uL (ref 150–400)
RBC: 4.72 MIL/uL (ref 4.22–5.81)
RDW: 15.6 % — ABNORMAL HIGH (ref 11.5–15.5)
WBC: 7.5 10*3/uL (ref 4.0–10.5)
nRBC: 0 % (ref 0.0–0.2)

## 2019-08-01 LAB — CBG MONITORING, ED
Glucose-Capillary: 171 mg/dL — ABNORMAL HIGH (ref 70–99)
Glucose-Capillary: 218 mg/dL — ABNORMAL HIGH (ref 70–99)
Glucose-Capillary: 284 mg/dL — ABNORMAL HIGH (ref 70–99)

## 2019-08-01 LAB — HEMOGLOBIN A1C
Hgb A1c MFr Bld: 9.7 % — ABNORMAL HIGH (ref 4.8–5.6)
Mean Plasma Glucose: 231.69 mg/dL

## 2019-08-01 LAB — MAGNESIUM: Magnesium: 2 mg/dL (ref 1.7–2.4)

## 2019-08-01 LAB — HIV ANTIBODY (ROUTINE TESTING W REFLEX): HIV Screen 4th Generation wRfx: NONREACTIVE

## 2019-08-01 LAB — CREATININE, SERUM
Creatinine, Ser: 1.49 mg/dL — ABNORMAL HIGH (ref 0.61–1.24)
GFR calc Af Amer: >60 mL/min (ref >60)
GFR calc non Af Amer: 55 mL/min — ABNORMAL LOW (ref >60)

## 2019-08-01 LAB — GLUCOSE, CAPILLARY
Glucose-Capillary: 255 mg/dL — ABNORMAL HIGH (ref 70–99)
Glucose-Capillary: 230 mg/dL — ABNORMAL HIGH (ref 70–99)

## 2019-08-01 LAB — PHOSPHORUS: Phosphorus: 3.6 mg/dL (ref 2.5–4.6)

## 2019-08-01 LAB — ECHOCARDIOGRAM COMPLETE

## 2019-08-01 MED ORDER — FUROSEMIDE 10 MG/ML IJ SOLN
60.0000 mg | Freq: Two times a day (BID) | INTRAMUSCULAR | Status: DC
  Administered 2019-08-01 – 2019-08-02 (×2): 60 mg via INTRAVENOUS
  Filled 2019-08-01 (×2): qty 6

## 2019-08-01 MED ORDER — INSULIN ASPART 100 UNIT/ML ~~LOC~~ SOLN
0.0000 [IU] | Freq: Three times a day (TID) | SUBCUTANEOUS | Status: DC
  Administered 2019-08-01: 8 [IU] via SUBCUTANEOUS
  Administered 2019-08-01: 5 [IU] via SUBCUTANEOUS
  Administered 2019-08-01: 8 [IU] via SUBCUTANEOUS
  Administered 2019-08-02: 18:00:00 5 [IU] via SUBCUTANEOUS
  Administered 2019-08-02: 8 [IU] via SUBCUTANEOUS
  Administered 2019-08-02: 5 [IU] via SUBCUTANEOUS
  Administered 2019-08-03: 11 [IU] via SUBCUTANEOUS
  Administered 2019-08-03: 5 [IU] via SUBCUTANEOUS
  Administered 2019-08-03 – 2019-08-04 (×3): 8 [IU] via SUBCUTANEOUS
  Administered 2019-08-04 – 2019-08-06 (×5): 5 [IU] via SUBCUTANEOUS
  Administered 2019-08-06: 12:00:00 11 [IU] via SUBCUTANEOUS

## 2019-08-01 MED ORDER — INSULIN ASPART 100 UNIT/ML ~~LOC~~ SOLN
0.0000 [IU] | Freq: Every day | SUBCUTANEOUS | Status: DC
  Administered 2019-08-01: 5 [IU] via SUBCUTANEOUS
  Administered 2019-08-02: 4 [IU] via SUBCUTANEOUS
  Administered 2019-08-04: 2 [IU] via SUBCUTANEOUS
  Administered 2019-08-04: 3 [IU] via SUBCUTANEOUS
  Administered 2019-08-05: 2 [IU] via SUBCUTANEOUS

## 2019-08-01 MED ORDER — ATORVASTATIN CALCIUM 40 MG PO TABS
40.0000 mg | ORAL_TABLET | Freq: Every day | ORAL | Status: DC
  Administered 2019-08-01 – 2019-08-05 (×6): 40 mg via ORAL
  Filled 2019-08-01 (×6): qty 1

## 2019-08-01 NOTE — ED Notes (Signed)
Pt ambulated to bathroom without assist

## 2019-08-01 NOTE — ED Notes (Signed)
ED TO INPATIENT HANDOFF REPORT  ED Nurse Name and Phone #: Henderson BaltimoreHannie [phone]  S Name/Age/Gender Gary Jacobs 48 y.o. male Room/Bed: 030C/030C  Code Status   Code Status: Full Code  Home/SNF/Other Home Patient oriented to: self, place, time and situation Is this baseline? Yes   Triage Complete: Triage complete  Chief Complaint SHOB, Swelling in both legs  Triage Note Pt reports he has an abdominal sprain from 9/23, since then the pain has gotten worse. Pt reports he started having SHOB with the pain. Pt also reports BLE edema. Pt went to see his doctor this morning and was sent here for evaluation for a blood clot.   Allergies No Known Allergies  Level of Care/Admitting Diagnosis ED Disposition    ED Disposition Condition Comment   Admit  [facility] [[id number]]  Level of Care: Telemetry Cardiac [103]  Covid Evaluation: Asymptomatic Screening Protocol (No Symptoms)  Diagnosis: CHF (congestive heart failure) [facility](HCC) [[id number]]) [[id number]]  Admitting Physician: Frankey ShownADEFESO, OLADAPO [[id number]]  Attending Physician: Frankey ShownADEFESO, OLADAPO [[id number]]  Estimated length of stay: 3 - 4 days  Certification:: I certify this patient will need inpatient services for at least 2 midnights  PT Class (Do Not Modify): Inpatient [101]  PT Acc Code (Do Not Modify): Private [1]       B Medical/Surgery History Past Medical History:  Diagnosis Date  . Diabetes mellitus without complication (HCC)   . Hypertension   . Sleep apnea     A IV Location/Drains/Wounds Patient Lines/Drains/Airways Status   Active Line/Drains/Airways    Name:   Placement date:   Placement time:   Site:   Days:   Peripheral IV 07/31/19 Right Antecubital   07/31/19    1510    Antecubital   1          Intake/Output Last 24 hours  Intake/Output Summary (Last 24 hours) at 08/01/2019 1225 Last data filed at 08/01/2019 1003 Gross per 24 hour  Intake 210 ml  Output 1625 ml  Net -1415 ml     Labs/Imaging Results for orders placed or performed during the hospital encounter of 07/31/19 (from the past 48 hour(s))  Basic metabolic panel     Status: Abnormal   Collection Time: 07/31/19 12:47 PM  Result Value Ref Range   Sodium 137 135 - 145 mmol/L   Potassium 4.7 3.5 - 5.1 mmol/L   Chloride 102 98 - 111 mmol/L   CO2 25 22 - 32 mmol/L   Glucose, Bld 282 (H) 70 - 99 mg/dL   BUN 15 6 - 20 mg/dL   Creatinine, Ser 8.651.62 (H) 0.61 - 1.24 mg/dL   Calcium 9.1 8.9 - 78.410.3 mg/dL   GFR calc non Af Amer 50 (L) >60 mL/min   GFR calc Af Amer 58 (L) >60 mL/min   Anion gap 10 5 - 15    Comment: Performed at [facility], [address] [place], [place] [id number]  CBC     Status: Abnormal   Collection Time: 07/31/19 12:47 PM  Result Value Ref Range   WBC 6.5 4.0 - 10.5 K/uL   RBC 4.74 4.22 - 5.81 MIL/uL   Hemoglobin 12.7 (L) 13.0 - 17.0 g/dL   HCT 52.841.3 41.339.0 - 24.452.0 %   MCV 87.1 80.0 - 100.0 fL   MCH 26.8 26.0 - 34.0 pg   MCHC 30.8 30.0 - 36.0 g/dL   RDW 01.015.5 27.211.5 - 53.615.5 %   Platelets 248 150 - 400 K/uL  nRBC 0.0 0.0 - 0.2 %    Comment: Performed at [facility], [place] [address] [place], [place] [address]  Troponin I (High Sensitivity)     Status: Abnormal   Collection Time: 07/31/19 12:47 PM  Result Value Ref Range   Troponin I (High Sensitivity) 186 (HH) <18 ng/L    Comment: CRITICAL RESULT CALLED TO, READ BACK BY AND VERIFIED WITHCHANCELER PULLIN RN [phone] BY A BENNETT (NOTE) Elevated high sensitivity troponin I (hsTnI) values and significant  changes across serial measurements may suggest ACS but many other  chronic and acute conditions are known to elevate hsTnI results.  Refer to the Links section for chest pain algorithms and additional  guidance. Performed at [facility], [place] [address] [place], [place] [address]   Troponin I (High Sensitivity)     Status: Abnormal   Collection Time: 07/31/19  3:34 PM  Result Value Ref Range   Troponin I (High  Sensitivity) 169 (HH) <18 ng/L    Comment: CRITICAL VALUE NOTED.  VALUE IS CONSISTENT WITH PREVIOUSLY REPORTED AND CALLED VALUE. (NOTE) Elevated high sensitivity troponin I (hsTnI) values and significant  changes across serial measurements may suggest ACS but many other  chronic and acute conditions are known to elevate hsTnI results.  Refer to the Links section for chest pain algorithms and additional  guidance. Performed at [facility], [place] [address], [place], [place] [address]   Brain natriuretic peptide     Status: Abnormal   Collection Time: 07/31/19  3:35 PM  Result Value Ref Range   B Natriuretic Peptide 549.0 (H) 0.0 - 100.0 pg/mL    Comment: Performed at [place] [address], [place], [place] [address]  SARS CORONAVIRUS 2 (TAT 6-24 HRS) Nasopharyngeal Nasopharyngeal Swab     Status: None   Collection Time: 07/31/19  5:53 PM   Specimen: Nasopharyngeal Swab  Result Value Ref Range   SARS Coronavirus 2 NEGATIVE NEGATIVE    Comment: (NOTE) SARS-CoV-2 target nucleic acids are NOT DETECTED. The SARS-CoV-2 RNA is generally detectable in upper and lower respiratory specimens during the acute phase of infection. Negative results do not preclude SARS-CoV-2 infection, do not rule out co-infections with other pathogens, and should not be used as the sole basis for treatment or other patient management decisions. Negative results must be combined with clinical observations, patient history, and epidemiological information. The expected result is Negative. Fact Sheet for Patients: SugarRoll.be Fact Sheet for Healthcare Providers: https://www.woods-mathews.com/ This test is not yet approved or cleared by the [place] FDA and  has been authorized for detection and/or diagnosis of SARS-CoV-2 by FDA under an Emergency Use Authorization (EUA). This EUA will remain  in effect (meaning this test can be used) for the duration  of the COVID-19 declaration under Section 56 4(b)(1) of the Act, 21 U.S.C. section 360bbb-3(b)(1), unless the authorization is terminated or revoked sooner. Performed at [facility], [place] [address] [place], [place] [address]   HIV Antibody (routine testing w rflx)     Status: None   Collection Time: 07/31/19 11:26 PM  Result Value Ref Range   HIV Screen 4th Generation wRfx NON REACTIVE NON REACTIVE    Comment: Performed at [facility], [place] [address], [place], [place] [address]  CBC     Status: Abnormal   Collection Time: 07/31/19 11:26 PM  Result Value Ref Range   WBC 7.9 4.0 - 10.5 K/uL  RBC 4.90 4.22 - 5.81 MIL/uL   Hemoglobin 13.0 13.0 - 17.0 g/dL   HCT 08.6 76.1 - 95.0 %   MCV 85.7 80.0 - 100.0 fL   MCH 26.5 26.0 - 34.0 pg   MCHC 31.0 30.0 - 36.0 g/dL   RDW 93.2 (H) 67.1 - 24.5 %   Platelets 243 150 - 400 K/uL   nRBC 0.0 0.0 - 0.2 %    Comment: Performed at [facility], [address] [place], [place] [address]  Creatinine, serum     Status: Abnormal   Collection Time: 07/31/19 11:26 PM  Result Value Ref Range   Creatinine, Ser 1.49 (H) 0.61 - 1.24 mg/dL   GFR calc non Af Amer 55 (L) >60 mL/min   GFR calc Af Amer >60 >60 mL/min    Comment: Performed at [facility], [address] [place] [address] [place], [place] [address]  CBG monitoring, ED     Status: Abnormal   Collection Time: 08/01/19 12:31 AM  Result Value Ref Range   Glucose-Capillary 171 (H) 70 - 99 mg/dL  Comprehensive metabolic panel     Status: Abnormal   Collection Time: 08/01/19  4:53 AM  Result Value Ref Range   Sodium 136 135 - 145 mmol/L   Potassium 4.7 3.5 - 5.1 mmol/L   Chloride 103 98 - 111 mmol/L   CO2 24 22 - 32 mmol/L   Glucose, Bld 338 (H) 70 - 99 mg/dL   BUN 15 6 - 20 mg/dL   Creatinine, Ser 0.53 (H) 0.61 - 1.24 mg/dL   Calcium 9.2 8.9 - 97.6 mg/dL   Total Protein 6.1 (L) 6.5 - 8.1 g/dL   Albumin 3.4 (L) 3.5 - 5.0 g/dL   AST 20 15 - 41 U/L   ALT 19 0 - 44 U/L   Alkaline  Phosphatase 92 38 - 126 U/L   Total Bilirubin 1.0 0.3 - 1.2 mg/dL   GFR calc non Af Amer 54 (L) >60 mL/min   GFR calc Af Amer >60 >60 mL/min   Anion gap 9 5 - 15    Comment: Performed at [facility], [address] [place], [place] [address]  CBC     Status: Abnormal   Collection Time: 08/01/19  4:53 AM  Result Value Ref Range   WBC 7.5 4.0 - 10.5 K/uL   RBC 4.72 4.22 - 5.81 MIL/uL   Hemoglobin 12.4 (L) 13.0 - 17.0 g/dL   HCT 37.9 02.4 - 09.7 %   MCV 85.6 80.0 - 100.0 fL   MCH 26.3 26.0 - 34.0 pg   MCHC 30.7 30.0 - 36.0 g/dL   RDW 35.3 (H) 29.9 - 24.2 %   Platelets 248 150 - 400 K/uL   nRBC 0.0 0.0 - 0.2 %    Comment: Performed at [facility], [address] [place], [place] [address]  Magnesium     Status: None   Collection Time: 08/01/19  4:53 AM  Result Value Ref Range   Magnesium 2.0 1.7 - 2.4 mg/dL    Comment: Performed at [facility], [address] [place], [place] [address]  Phosphorus     Status: None   Collection Time: 08/01/19  4:53 AM  Result Value Ref Range   Phosphorus 3.6 2.5 - 4.6 mg/dL    Comment: Performed at [facility], [address] [place], [place] [address]  Hemoglobin A1c     Status: Abnormal  Collection Time: 08/01/19  4:53 AM  Result Value Ref Range   Hgb A1c MFr Bld 9.7 (H) 4.8 - 5.6 %    Comment: (NOTE) Pre diabetes:          5.7%-6.4% Diabetes:              >6.4% Glycemic control for   <7.0% adults with diabetes    Mean Plasma Glucose 231.69 mg/dL    Comment: Performed at [facility], [address] [place], [place] [address]  CBG monitoring, ED     Status: Abnormal   Collection Time: 08/01/19  8:02 AM  Result Value Ref Range   Glucose-Capillary 284 (H) 70 - 99 mg/dL   Comment 1 Notify RN    Comment 2 Document in Chart   CBG monitoring, ED     Status: Abnormal   Collection Time: 08/01/19 11:54 AM  Result Value Ref Range   Glucose-Capillary 218 (H) 70 - 99 mg/dL   Comment 1 Notify RN    Comment  2 Document in Chart    Dg Chest 2 View  Result Date: 07/31/2019 CLINICAL DATA:  Shortness of breath. EXAM: CHEST - 2 VIEW COMPARISON:  . FINDINGS: Cardiomegaly. Mild pulmonary venous congestion bilateral interstitial prominence noted. CHF could present in this fashion. Right upper lobe alveolar infiltrate. This could represent focal pulmonary edema and/or pneumonia. No pleural effusion or pneumothorax. Degenerative change thoracic spine. IMPRESSION: 1. Cardiomegaly with pulmonary venous congestion and bilateral interstitial prominence suggesting CHF. 2. Right upper lobe alveolar infiltrate noted. This could represent focal edema and or pneumonia. Electronically Signed   By: Maisie Fus  Register   On: 07/31/2019 13:00   Ct Angio Chest Pe W/cm &/or Wo Cm  Result Date: 07/31/2019 CLINICAL DATA:  PE suspected, high pretest prob. Shortness of breath. EXAM: CT ANGIOGRAPHY CHEST WITH CONTRAST TECHNIQUE: Multidetector CT imaging of the chest was performed using the standard protocol during bolus administration of intravenous contrast. Multiplanar CT image reconstructions and MIPs were obtained to evaluate the vascular anatomy. CONTRAST:  OMNIPAQUE IOHEXOL 350 MG/ML SOLN COMPARISON:  Radiograph earlier this day. FINDINGS: Cardiovascular: There are no filling defects within the central pulmonary arteries to suggest pulmonary embolus. Evaluation is diagnostic to the proximal segmental level. Distal segmental and subsegmental branches cannot be assessed due to contrast bolus timing and soft tissue attenuation from habitus. Mild multi chamber cardiomegaly. Minimal contrast refluxes into the IVC and hepatic veins. Thoracic aorta is normal in caliber. Cannot assess for dissection given phase of contrast tailored to pulmonary artery evaluation. Mediastinum/Nodes: Calcified subcarinal lymph nodes. No noncalcified adenopathy. Heterogeneous enlargement of the thyroid gland calcifications on the left extending substernal.  No dominant nodule, however evaluation is technically limited. Esophagus is decompressed. Lungs/Pleura: Nodular of bilateral ground-glass opacities throughout both lungs, slightly more prominent on the right. Findings slightly more prominent in the upper lobes. Mild smooth septal thickening. Trachea and mainstem bronchi are patent. No solid pulmonary mass. Small right pleural effusion. Upper Abdomen: Minimal contrast refluxing into the hepatic veins and IVC. No other acute finding. Musculoskeletal: There are no acute or suspicious osseous abnormalities. Review of the MIP images confirms the above findings. IMPRESSION: 1. No central pulmonary embolus to the proximal segmental level. Distal segmental and subsegmental branches cannot be assessed due to contrast bolus timing and soft tissue attenuation from habitus. 2. Nodular bilateral ground-glass opacities throughout both lungs, slightly more prominent in the upper lobes. Findings may be due to atypical infection (including but not classic  for COVID-19) or pulmonary edema. In the setting of septal thickening, cardiomegaly, and small right pleural effusion, pulmonary edema is favored. 3. Mild multi chamber cardiomegaly. Minimal contrast refluxing into the IVC and hepatic veins, suggesting elevated right heart pressures. 4. Heterogeneously enlarged thyroid gland. Left thyroid calcification. No dominant nodule is seen, however evaluation is technically limited due to soft tissue attenuation from habitus. Consider further evaluation with thyroid ultrasound on a nonemergent basis. Electronically Signed   By: Narda Rutherford M.D.   On: 07/31/2019 18:11   Vas [place] Lower Extremity Venous (dvt)  Result Date: 08/01/2019  Lower Venous Study Indications: Swelling.  Risk Factors: None identified. Limitations: Body habitus and poor ultrasound/tissue interface. Comparison Study: 11/23/2018 - Performing Technologist: Chanda Busing RVT  Examination Guidelines: A complete  evaluation includes B-mode imaging, spectral Doppler, color Doppler, and power Doppler as needed of all accessible portions of each vessel. Bilateral testing is considered an integral part of a complete examination. Limited examinations for reoccurring indications may be performed as noted.  +---------+---------------+---------+-----------+----------+--------------+ RIGHT    CompressibilityPhasicitySpontaneityPropertiesThrombus Aging +---------+---------------+---------+-----------+----------+--------------+ CFV      Full           Yes      Yes                                 +---------+---------------+---------+-----------+----------+--------------+ SFJ      Full                                                        +---------+---------------+---------+-----------+----------+--------------+ FV Prox  Full                                                        +---------+---------------+---------+-----------+----------+--------------+ FV Mid   Full                                                        +---------+---------------+---------+-----------+----------+--------------+ FV DistalFull           Yes      Yes                                 +---------+---------------+---------+-----------+----------+--------------+ PFV      Full                                                        +---------+---------------+---------+-----------+----------+--------------+ POP      Full           Yes      Yes                                 +---------+---------------+---------+-----------+----------+--------------+ PTV  Full                                                        +---------+---------------+---------+-----------+----------+--------------+ PERO     Full                                                        +---------+---------------+---------+-----------+----------+--------------+    +----+---------------+---------+-----------+----------+--------------+ LEFTCompressibilityPhasicitySpontaneityPropertiesThrombus Aging +----+---------------+---------+-----------+----------+--------------+ CFV Full           Yes      Yes                                 +----+---------------+---------+-----------+----------+--------------+     Summary: Right: There is no evidence of deep vein thrombosis in the lower extremity. However, portions of this examination were limited- see technologist comments above. No cystic structure found in the popliteal fossa. Left: No evidence of common femoral vein obstruction.  *See table(s) above for measurements and observations.    Preliminary     Pending Labs Unresulted Labs (From admission, onward)    Start     Ordered   08/07/19 0500  Creatinine, serum  (enoxaparin (LOVENOX)    CrCl >/= 30 ml/min)  Weekly,   R    Comments: while on enoxaparin therapy    07/31/19 2018   08/01/19 0500  Basic metabolic panel  Daily,   R     07/31/19 2018          Vitals/Pain Today's Vitals   08/01/19 1004 08/01/19 1015 08/01/19 1144 08/01/19 1145  BP:   (!) 134/95   Pulse:  (!) 101 (!) 105   Resp:  19 19   Temp:      TempSrc:      SpO2:  96% 98%   PainSc: 0-No pain   0-No pain    Isolation Precautions No active isolations  Medications Medications  albuterol (PROVENTIL) (2.5 MG/3ML) 0.083% nebulizer solution 5 mg (5 mg Nebulization Not Given 07/31/19 1520)  sodium chloride flush (NS) 0.9 % injection 3 mL (3 mLs Intravenous Given 08/01/19 1015)  sodium chloride flush (NS) 0.9 % injection 3 mL (has no administration in time range)  0.9 %  sodium chloride infusion (has no administration in time range)  acetaminophen (TYLENOL) tablet 650 mg (has no administration in time range)  ondansetron (ZOFRAN) injection 4 mg (has no administration in time range)  furosemide (LASIX) injection 40 mg (40 mg Intravenous Given 08/01/19 0824)  lisinopril  (ZESTRIL) tablet 10 mg (10 mg Oral Given 08/01/19 1005)  aspirin EC tablet 81 mg (81 mg Oral Given 08/01/19 1005)  atorvastatin (LIPITOR) tablet 40 mg (40 mg Oral Given 08/01/19 0037)  insulin aspart (novoLOG) injection 0-15 Units (8 Units Subcutaneous Given 08/01/19 0822)  insulin aspart (novoLOG) injection 0-5 Units (0 Units Subcutaneous Hold 08/01/19 0039)  enoxaparin (LOVENOX) injection 80 mg (has no administration in time range)  sodium chloride flush (NS) 0.9 % injection 3 mL (3 mLs Intravenous Given 07/31/19 1521)  iohexol (OMNIPAQUE) 350 MG/ML injection 100 mL (100 mLs Intravenous Contrast Given 07/31/19 1730)  furosemide (LASIX) injection 20 mg (20  mg Intravenous Given 07/31/19 1910)    Mobility walks Low fall risk   Focused Assessments Pulmonary Assessment Handoff:  Lung sounds: Bilateral Breath Sounds: Clear L Breath Sounds: Clear R Breath Sounds: Clear, Coarse crackles O2 Device: Room Air        R Recommendations: See Admitting Provider Note  Report given to:   Additional Notes:

## 2019-08-01 NOTE — ED Notes (Signed)
Breakfast tray at bedside 

## 2019-08-01 NOTE — ED Notes (Signed)
Carb modified lunch tray ordered 

## 2019-08-01 NOTE — Progress Notes (Signed)
Right lower extremity venous duplex has been completed. Preliminary results can be found in CV Proc through chart review.  Results were given to the patient's nurse, Hannie.  08/01/19 10:09 AM Carlos Levering RVT

## 2019-08-01 NOTE — Progress Notes (Signed)
  Echocardiogram  2D Echocardiogram has been performed.  Haruna Rohlfs A Jackolyn Geron 08/01/2019, 9:58 AM

## 2019-08-01 NOTE — Progress Notes (Signed)
Patient Demographics:    Gary Jacobs, is a 48 y.o. male, DOB - 01/21/1971, MWU:[id number]  Admit date - 07/31/2019   Admitting Physician Frankey Shownladapo Adefeso, DO  Outpatient Primary MD for the patient is Caffie DammeSmith, Karla, MD  LOS - 1   Chief Complaint  Patient presents with   Shortness of Breath        Subjective:    Gary AcresCurtis Eggleton today has no fevers, no emesis,  No chest pain,   -Shortness of breath, orthopnea and dyspnea on exertion persist -  Assessment  & Plan :    Principal Problem:   Acute HFrEF (heart failure with reduced ejection fraction) /EF 20 to 25 % Active Problems:   CHF (congestive heart failure) (HCC)   Type 2 diabetes mellitus with hyperlipidemia (HCC)   Essential hypertension   AKI (acute kidney injury) (HCC)   Sleep apnea   Obesity, Class III, BMI 40-49.9 (morbid obesity) (HCC)   Elevated troponin I level   Hyperglycemia   Elevated serum creatinine  Brief Summary:- 48 y.o. male with medical history significant for type 2 diabetes mellitus, hypertension, hyperlipidemia, sleep apnea on CPAP at night and obesity   A/p 1)HFrEF--- acute  systolic dysfunction CHF, echo with EF of 20 to 25% -Increase IV Lasix to 60 mg every 12 hours, daily weights daily fluid input and output monitoring -Cardiology consult, patient may need ischemic work-up -Lower extremity Dopplers without DVT -Elevated troponin most likely due to demand ischemia in the setting of CHF rather than Frank ACS -Patient will need ACEI/ARB and possibly Imdur/hydralazine--await cardiology input -Currently on lisinopril 10 mg daily, add Coreg 3.25 mg daily  2) DM-- Use Novolog/Humalog Sliding scale insulin with Accu-Cheks/Fingersticks as ordered -Hold Metformin and Trulicity -A1c is 9.7   3)HTN--currently on lisinopril, Coreg 3.25 mg twice daily,  may use IV labetalol when necessary  Every 4 hours for systolic  blood pressure over 725160 mmhg  4) obesity with OSA--CPAP nightly  5)HLD-continue Lipitor and aspirin  6)AKI----acute kidney injury  Vs Possible CKD unknown stage -     creatinine on admission= 1.62 ,   baseline creatinine = 1.1 (10/2017 care everywhere)   , creatinine is now=1.52      , renally adjust medications, avoid nephrotoxic agents/dehydration/hypotension  Disposition/Need for in-Hospital Stay- patient unable to be discharged at this time due to --- acute CHF exacerbation with hypoxia initially, requiring IV diuresis  Code Status : Full   Family Communication:   NA (patient is alert, awake and coherent)  Disposition Plan  : TBD  Consults  :  Cardiology  DVT Prophylaxis  :  Lovenox -- SCDs/TEDs  Lab Results  Component Value Date   PLT 248 08/01/2019    Inpatient Medications  Scheduled Meds:  albuterol  5 mg Nebulization Once   aspirin EC  81 mg Oral Daily   atorvastatin  40 mg Oral QHS   enoxaparin (LOVENOX) injection  80 mg Subcutaneous Q24H   furosemide  40 mg Intravenous Q12H   insulin aspart  0-15 Units Subcutaneous TID WC   insulin aspart  0-5 Units Subcutaneous QHS   lisinopril  10 mg Oral Daily   sodium chloride flush  3 mL Intravenous Q12H   Continuous Infusions:  sodium chloride  PRN Meds:.sodium chloride, acetaminophen, ondansetron (ZOFRAN) IV, sodium chloride flush    Anti-infectives (From admission, onward)   None        Objective:   Vitals:   08/01/19 1144 08/01/19 1315 08/01/19 1724 08/01/19 1900  BP: (!) 134/95 (!) 150/107 (!) 137/106   Pulse: (!) 105 (!) 107 (!) 107   Resp: 19 (!) 23 (!) 22 18  Temp:  98.4 F (36.9 C) 98.1 F (36.7 C)   TempSrc:  Oral Oral   SpO2: 98% 100% 99%     Wt Readings from Last 3 Encounters:  No data found for Wt     Intake/Output Summary (Last 24 hours) at 08/01/2019 1934 Last data filed at 08/01/2019 1726 Gross per 24 hour  Intake 430 ml  Output 2100 ml  Net -1670 ml     Physical  Exam  Gen:- Awake Alert, obese, some conversational dyspnea HEENT:- [place].AT, No sclera icterus Neck-Supple Neck, +ve JVD,.  Lungs-diminished breath sounds faint bibasilar rales CV- S1, S2 normal, regular  Abd-  +ve B.Sounds, Abd Soft, No tenderness, increased truncal adiposity Extremity/Skin:-  2+ edema, pedal pulses present  Psych-affect is appropriate, oriented x3 Neuro-no new focal deficits, no tremors   Data Review:   Micro Results Recent Results (from the past 240 hour(s))  SARS CORONAVIRUS 2 (TAT 6-24 HRS) Nasopharyngeal Nasopharyngeal Swab     Status: None   Collection Time: 07/31/19  5:53 PM   Specimen: Nasopharyngeal Swab  Result Value Ref Range Status   SARS Coronavirus 2 NEGATIVE NEGATIVE Final    Comment: (NOTE) SARS-CoV-2 target nucleic acids are NOT DETECTED. The SARS-CoV-2 RNA is generally detectable in upper and lower respiratory specimens during the acute phase of infection. Negative results do not preclude SARS-CoV-2 infection, do not rule out co-infections with other pathogens, and should not be used as the sole basis for treatment or other patient management decisions. Negative results must be combined with clinical observations, patient history, and epidemiological information. The expected result is Negative. Fact Sheet for Patients: SugarRoll.be Fact Sheet for Healthcare Providers: https://www.woods-mathews.com/ This test is not yet approved or cleared by the [place] FDA and  has been authorized for detection and/or diagnosis of SARS-CoV-2 by FDA under an Emergency Use Authorization (EUA). This EUA will remain  in effect (meaning this test can be used) for the duration of the COVID-19 declaration under Section 56 4(b)(1) of the Act, 21 U.S.C. section 360bbb-3(b)(1), unless the authorization is terminated or revoked sooner. Performed at [facility], [place] [address], [place], [place] [address]      Radiology Reports Dg Chest 2 View  Result Date: 07/31/2019 CLINICAL DATA:  Shortness of breath. EXAM: CHEST - 2 VIEW COMPARISON:  . FINDINGS: Cardiomegaly. Mild pulmonary venous congestion bilateral interstitial prominence noted. CHF could present in this fashion. Right upper lobe alveolar infiltrate. This could represent focal pulmonary edema and/or pneumonia. No pleural effusion or pneumothorax. Degenerative change thoracic spine. IMPRESSION: 1. Cardiomegaly with pulmonary venous congestion and bilateral interstitial prominence suggesting CHF. 2. Right upper lobe alveolar infiltrate noted. This could represent focal edema and or pneumonia. Electronically Signed   By: Marcello Moores  Register   On: 07/31/2019 13:00   Ct Angio Chest Pe W/cm &/or Wo Cm  Result Date: 07/31/2019 CLINICAL DATA:  PE suspected, high pretest prob. Shortness of breath. EXAM: CT ANGIOGRAPHY CHEST WITH CONTRAST TECHNIQUE: Multidetector CT imaging of the chest was performed using the standard protocol during bolus administration of intravenous contrast. Multiplanar CT image  reconstructions and MIPs were obtained to evaluate the vascular anatomy. CONTRAST:  100mL OMNIPAQUE IOHEXOL 350 MG/ML SOLN COMPARISON:  Radiograph earlier this day. FINDINGS: Cardiovascular: There are no filling defects within the central pulmonary arteries to suggest pulmonary embolus. Evaluation is diagnostic to the proximal segmental level. Distal segmental and subsegmental branches cannot be assessed due to contrast bolus timing and soft tissue attenuation from habitus. Mild multi chamber cardiomegaly. Minimal contrast refluxes into the IVC and hepatic veins. Thoracic aorta is normal in caliber. Cannot assess for dissection given phase of contrast tailored to pulmonary artery evaluation. Mediastinum/Nodes: Calcified subcarinal lymph nodes. No noncalcified adenopathy. Heterogeneous enlargement of the thyroid gland calcifications on the left extending substernal.  No dominant nodule, however evaluation is technically limited. Esophagus is decompressed. Lungs/Pleura: Nodular of bilateral ground-glass opacities throughout both lungs, slightly more prominent on the right. Findings slightly more prominent in the upper lobes. Mild smooth septal thickening. Trachea and mainstem bronchi are patent. No solid pulmonary mass. Small right pleural effusion. Upper Abdomen: Minimal contrast refluxing into the hepatic veins and IVC. No other acute finding. Musculoskeletal: There are no acute or suspicious osseous abnormalities. Review of the MIP images confirms the above findings. IMPRESSION: 1. No central pulmonary embolus to the proximal segmental level. Distal segmental and subsegmental branches cannot be assessed due to contrast bolus timing and soft tissue attenuation from habitus. 2. Nodular bilateral ground-glass opacities throughout both lungs, slightly more prominent in the upper lobes. Findings may be due to atypical infection (including but not classic for COVID-19) or pulmonary edema. In the setting of septal thickening, cardiomegaly, and small right pleural effusion, pulmonary edema is favored. 3. Mild multi chamber cardiomegaly. Minimal contrast refluxing into the IVC and hepatic veins, suggesting elevated right heart pressures. 4. Heterogeneously enlarged thyroid gland. Left thyroid calcification. No dominant nodule is seen, however evaluation is technically limited due to soft tissue attenuation from habitus. Consider further evaluation with thyroid ultrasound on a nonemergent basis. Electronically Signed   By: Narda RutherfordMelanie  Sanford M.D.   On: 07/31/2019 18:11   Vas [place] Lower Extremity Venous (dvt)  Result Date: 08/01/2019  Lower Venous Study Indications: Swelling.  Risk Factors: None identified. Limitations: Body habitus and poor ultrasound/tissue interface. Comparison Study: 11/23/2018 - Performing Technologist: Chanda BusingGregory Collins RVT  Examination Guidelines: A complete  evaluation includes B-mode imaging, spectral Doppler, color Doppler, and power Doppler as needed of all accessible portions of each vessel. Bilateral testing is considered an integral part of a complete examination. Limited examinations for reoccurring indications may be performed as noted.  +---------+---------------+---------+-----------+----------+--------------+  RIGHT     Compressibility Phasicity Spontaneity Properties Thrombus Aging  +---------+---------------+---------+-----------+----------+--------------+  CFV       Full            Yes       Yes                                    +---------+---------------+---------+-----------+----------+--------------+  SFJ       Full                                                             +---------+---------------+---------+-----------+----------+--------------+  FV Prox   Full                                                             +---------+---------------+---------+-----------+----------+--------------+  FV Mid    Full                                                             +---------+---------------+---------+-----------+----------+--------------+  FV Distal Full            Yes       Yes                                    +---------+---------------+---------+-----------+----------+--------------+  PFV       Full                                                             +---------+---------------+---------+-----------+----------+--------------+  POP       Full            Yes       Yes                                    +---------+---------------+---------+-----------+----------+--------------+  PTV       Full                                                             +---------+---------------+---------+-----------+----------+--------------+  PERO      Full                                                             +---------+---------------+---------+-----------+----------+--------------+    +----+---------------+---------+-----------+----------+--------------+  LEFT Compressibility Phasicity Spontaneity Properties Thrombus Aging  +----+---------------+---------+-----------+----------+--------------+  CFV  Full            Yes       Yes                                    +----+---------------+---------+-----------+----------+--------------+     Summary: Right: There is no evidence of deep vein thrombosis in the lower extremity. However, portions of this examination were limited- see technologist comments above. No cystic structure found in the popliteal fossa. Left: No evidence of common femoral vein obstruction.  *See table(s) above for measurements and observations. Electronically signed by Sherald Hess MD on 08/01/2019 at 3:19:23 PM.    Final      CBC Recent Labs  Lab 07/31/19 1247 07/31/19 2326 08/01/19 0453  WBC 6.5 7.9 7.5  HGB 12.7* 13.0 12.4*  HCT 41.3 42.0 40.4  PLT 248 243 248  MCV 87.1 85.7 85.6  MCH 26.8 26.5 26.3  MCHC 30.8 31.0 30.7  RDW 15.5 15.6* 15.6*    Chemistries  Recent Labs  Lab 07/31/19 1247 07/31/19 2326 08/01/19 0453  NA 137  --  136  K 4.7  --  4.7  CL 102  --  103  CO2 25  --  24  GLUCOSE 282*  --  338*  BUN 15  --  15  CREATININE 1.62* 1.49* 1.52*  CALCIUM 9.1  --  9.2  MG  --   --  2.0  AST  --   --  20  ALT  --   --  19  ALKPHOS  --   --  92  BILITOT  --   --  1.0   ------------------------------------------------------------------------------------------------------------------ No results for input(s): CHOL, HDL, LDLCALC, TRIG, CHOLHDL, LDLDIRECT in the last 72 hours.  Lab Results  Component Value Date   HGBA1C 9.7 (H) 08/01/2019   ------------------------------------------------------------------------------------------------------------------ No results for input(s): TSH, T4TOTAL, T3FREE, THYROIDAB in the last 72 hours.  Invalid input(s):  FREET3 ------------------------------------------------------------------------------------------------------------------ No results for input(s): VITAMINB12, FOLATE, FERRITIN, TIBC, IRON, RETICCTPCT in the last 72 hours.  Coagulation profile No results for input(s): INR, PROTIME in the last 168 hours.  No results for input(s): DDIMER in the last 72 hours.  Cardiac Enzymes No results for input(s): CKMB, TROPONINI, MYOGLOBIN in the last 168 hours.  Invalid input(s): CK ------------------------------------------------------------------------------------------------------------------    Component Value Date/Time   BNP 549.0 (H) 07/31/2019 1535     Shon Hale M.D on 08/01/2019 at 7:34 PM  Go to [email or web address] - for contact info  [facility] - Office  [phone]

## 2019-08-01 NOTE — ED Notes (Signed)
Breakfast Ordered 

## 2019-08-02 ENCOUNTER — Encounter (HOSPITAL_COMMUNITY): Payer: Self-pay | Admitting: Internal Medicine

## 2019-08-02 DIAGNOSIS — G4739 Other sleep apnea: Secondary | ICD-10-CM

## 2019-08-02 DIAGNOSIS — I10 Essential (primary) hypertension: Secondary | ICD-10-CM

## 2019-08-02 DIAGNOSIS — N179 Acute kidney failure, unspecified: Secondary | ICD-10-CM

## 2019-08-02 DIAGNOSIS — I5021 Acute systolic (congestive) heart failure: Secondary | ICD-10-CM

## 2019-08-02 LAB — BASIC METABOLIC PANEL
Anion gap: 12 (ref 5–15)
BUN: 16 mg/dL (ref 6–20)
CO2: 30 mmol/L (ref 22–32)
Calcium: 9.6 mg/dL (ref 8.9–10.3)
Chloride: 97 mmol/L — ABNORMAL LOW (ref 98–111)
Creatinine, Ser: 1.69 mg/dL — ABNORMAL HIGH (ref 0.61–1.24)
GFR calc Af Amer: 55 mL/min — ABNORMAL LOW (ref >60)
GFR calc non Af Amer: 47 mL/min — ABNORMAL LOW (ref >60)
Glucose, Bld: 237 mg/dL — ABNORMAL HIGH (ref 70–99)
Potassium: 4.5 mmol/L (ref 3.5–5.1)
Sodium: 139 mmol/L (ref 135–145)

## 2019-08-02 LAB — GLUCOSE, CAPILLARY
Glucose-Capillary: 238 mg/dL — ABNORMAL HIGH (ref 70–99)
Glucose-Capillary: 299 mg/dL — ABNORMAL HIGH (ref 70–99)
Glucose-Capillary: 225 mg/dL — ABNORMAL HIGH (ref 70–99)
Glucose-Capillary: 347 mg/dL — ABNORMAL HIGH (ref 70–99)

## 2019-08-02 MED ORDER — CARVEDILOL 6.25 MG PO TABS
6.2500 mg | ORAL_TABLET | Freq: Two times a day (BID) | ORAL | Status: DC
  Administered 2019-08-02 – 2019-08-06 (×8): 6.25 mg via ORAL
  Filled 2019-08-02 (×8): qty 1

## 2019-08-02 MED ORDER — CARVEDILOL 3.125 MG PO TABS: 3.1250 mg | ORAL_TABLET | Freq: Two times a day (BID) | ORAL | Status: DC

## 2019-08-02 MED ORDER — SPIRONOLACTONE 12.5 MG HALF TABLET
12.5000 mg | ORAL_TABLET | Freq: Every day | ORAL | Status: DC
  Administered 2019-08-02 – 2019-08-06 (×4): 12.5 mg via ORAL
  Filled 2019-08-02 (×5): qty 1

## 2019-08-02 MED ORDER — FUROSEMIDE 10 MG/ML IJ SOLN
40.0000 mg | Freq: Two times a day (BID) | INTRAMUSCULAR | Status: DC
  Administered 2019-08-02 – 2019-08-04 (×4): 40 mg via INTRAVENOUS
  Filled 2019-08-02 (×4): qty 4

## 2019-08-02 NOTE — Progress Notes (Signed)
Patient Demographics:    Gary Jacobs Mensing, is a 48 y.o. male, DOB - 06/07/1971, JYN:[id number]  Admit date - 07/31/2019   Admitting Physician Frankey Shownladapo Adefeso, DO  Outpatient Primary MD for the patient is Caffie DammeSmith, Karla, MD  LOS - 2   Chief Complaint  Patient presents with   Shortness of Breath        Subjective:    Gary Jacobs Omalley today has no fevers, no emesis,  No chest pain,   -Shortness of breath at rest has improved, voided quite a bit, orthopnea improving, dyspnea on exertion persist -  Assessment  & Plan :    Principal Problem:   Acute HFrEF (heart failure with reduced ejection fraction) /EF 20 to 25 % Active Problems:   CHF (congestive heart failure) (HCC)   Type 2 diabetes mellitus with hyperlipidemia (HCC)   Essential hypertension   AKI (acute kidney injury) (HCC)   Sleep apnea   Obesity, Class III, BMI 40-49.9 (morbid obesity) (HCC)   Elevated troponin I level   Hyperglycemia   Elevated serum creatinine  Brief Summary:- 48 y.o. male with medical history significant for type 2 diabetes mellitus, hypertension, hyperlipidemia, sleep apnea on CPAP at night and obesity admitted on 07/31/2019 with acute CHF exacerbation -Awaiting [facility] and R[facility] this admission to Rule out ischemic cardiomyopathy and evaluate right-sided pressures  A/p 1)HFrEF--- acute  systolic dysfunction CHF, echo with EF of 20 to 25% -Cardiology consult appreciated -[facility] to Rule out ischemic cardiomyopathy and evaluate right-sided pressures --This may be dilated cardiomyopathy -Diuresed well, fluid balance negative over 6500 mL  -okay to decrease Lasix to 40 mg IV twice daily, - daily weights daily fluid input and output monitoring -Lower extremity Dopplers without DVT -Elevated troponin most likely due to demand ischemia in the setting of CHF rather than Homero FellersFrank ACS -Start Aldactone  ,-Continue Coreg, hold lisinopril in case patient has to be started on Entresto  2) DM-- Use Novolog/Humalog Sliding scale insulin with Accu-Cheks/Fingersticks as ordered -Hold Metformin and Trulicity -A1c is 9.7   3)HTN-hold lisinopril as above, continue Coreg , Lasix and Aldactone as above - may use IV labetalol when necessary  Every 4 hours for systolic blood pressure over 160 mmhg  4) obesity with OSA--CPAP nightly  5)HLD-continue Lipitor and aspirin  6)AKI----acute kidney injury  Vs Possible CKD unknown stage -     creatinine on admission= 1.62 ,   baseline creatinine = 1.1 (10/2017 care everywhere)   , creatinine is now=1.69      , renally adjust medications, avoid nephrotoxic agents/dehydration/hypotension  Disposition/Need for in-Hospital Stay- patient unable to be discharged at this time due to --- acute CHF exacerbation with hypoxia initially, requiring IV diuresis -Awaiting [facility] and [facility] this admission to Rule out ischemic cardiomyopathy and evaluate right-sided pressures  Code Status : Full   Family Communication:   NA (patient is alert, awake and coherent)  Disposition Plan  : TBD  Consults  :  Cardiology  DVT Prophylaxis  :  Lovenox -- SCDs/TEDs  Lab Results  Component Value Date   PLT 248 08/01/2019    Inpatient Medications  Scheduled Meds:  albuterol  5 mg Nebulization Once   aspirin EC  81 mg  Oral Daily   atorvastatin  40 mg Oral QHS   carvedilol  6.25 mg Oral BID WC   enoxaparin (LOVENOX) injection  80 mg Subcutaneous Q24H   furosemide  40 mg Intravenous Q12H   insulin aspart  0-15 Units Subcutaneous TID WC   insulin aspart  0-5 Units Subcutaneous QHS   sodium chloride flush  3 mL Intravenous Q12H   spironolactone  12.5 mg Oral Daily   Continuous Infusions:  sodium chloride     PRN Meds:.sodium chloride, acetaminophen, ondansetron (ZOFRAN) IV, sodium chloride flush  Anti-infectives (From admission, onward)   None       Objective:    Vitals:   08/02/19 0512 08/02/19 0748 08/02/19 1200 08/02/19 1725  BP: (!) 125/92 (!) 133/97 123/83 116/85  Pulse: 89 77 (!) 108 (!) 103  Resp: 18 18 20 20   Temp: 98.3 F (36.8 C) 99.6 F (37.6 C) 98.2 F (36.8 C)   TempSrc: Oral Oral Oral   SpO2: 100% 94% 100%   Weight: (!) 161.9 kg       Wt Readings from Last 3 Encounters:  08/02/19 (!) 161.9 kg     Intake/Output Summary (Last 24 hours) at 08/02/2019 1901 Last data filed at 08/02/2019 1225 Gross per 24 hour  Intake 480 ml  Output 7025 ml  Net -6545 ml     Physical Exam Gen:- Awake Alert, obese, No conversational dyspnea HEENT:- [place].AT, No sclera icterus Neck-Supple Neck, +ve JVD,.  Lungs-improving air movement, no wheezing  CV- S1, S2 normal, regular ,  3/6 SM Abd-  +ve B.Sounds, Abd Soft, No tenderness, increased truncal adiposity Extremity/Skin:-  2+ edema, pedal pulses present  Psych-affect is appropriate, oriented x3 Neuro-no new focal deficits, no tremors   Data Review:   Micro Results Recent Results (from the past 240 hour(s))  SARS CORONAVIRUS 2 (TAT 6-24 HRS) Nasopharyngeal Nasopharyngeal Swab     Status: None   Collection Time: 07/31/19  5:53 PM   Specimen: Nasopharyngeal Swab  Result Value Ref Range Status   SARS Coronavirus 2 NEGATIVE NEGATIVE Final    Comment: (NOTE) SARS-CoV-2 target nucleic acids are NOT DETECTED. The SARS-CoV-2 RNA is generally detectable in upper and lower respiratory specimens during the acute phase of infection. Negative results do not preclude SARS-CoV-2 infection, do not rule out co-infections with other pathogens, and should not be used as the sole basis for treatment or other patient management decisions. Negative results must be combined with clinical observations, patient history, and epidemiological information. The expected result is Negative. Fact Sheet for Patients: SugarRoll.be Fact Sheet for Healthcare  Providers: https://www.woods-mathews.com/ This test is not yet approved or cleared by the [place] FDA and  has been authorized for detection and/or diagnosis of SARS-CoV-2 by FDA under an Emergency Use Authorization (EUA). This EUA will remain  in effect (meaning this test can be used) for the duration of the COVID-19 declaration under Section 56 4(b)(1) of the Act, 21 U.S.C. section 360bbb-3(b)(1), unless the authorization is terminated or revoked sooner. Performed at [facility], [place] [address], [place], [place] [address]     Radiology Reports Dg Chest 2 View  Result Date: 07/31/2019 CLINICAL DATA:  Shortness of breath. EXAM: CHEST - 2 VIEW COMPARISON:  . FINDINGS: Cardiomegaly. Mild pulmonary venous congestion bilateral interstitial prominence noted. CHF could present in this fashion. Right upper lobe alveolar infiltrate. This could represent focal pulmonary edema and/or pneumonia. No pleural effusion or pneumothorax. Degenerative change thoracic spine. IMPRESSION: 1. Cardiomegaly with  pulmonary venous congestion and bilateral interstitial prominence suggesting CHF. 2. Right upper lobe alveolar infiltrate noted. This could represent focal edema and or pneumonia. Electronically Signed   By: Maisie Fus  Register   On: 07/31/2019 13:00   Ct Angio Chest Pe W/cm &/or Wo Cm  Result Date: 07/31/2019 CLINICAL DATA:  PE suspected, high pretest prob. Shortness of breath. EXAM: CT ANGIOGRAPHY CHEST WITH CONTRAST TECHNIQUE: Multidetector CT imaging of the chest was performed using the standard protocol during bolus administration of intravenous contrast. Multiplanar CT image reconstructions and MIPs were obtained to evaluate the vascular anatomy. CONTRAST:  OMNIPAQUE IOHEXOL 350 MG/ML SOLN COMPARISON:  Radiograph earlier this day. FINDINGS: Cardiovascular: There are no filling defects within the central pulmonary arteries to suggest pulmonary embolus. Evaluation is diagnostic  to the proximal segmental level. Distal segmental and subsegmental branches cannot be assessed due to contrast bolus timing and soft tissue attenuation from habitus. Mild multi chamber cardiomegaly. Minimal contrast refluxes into the IVC and hepatic veins. Thoracic aorta is normal in caliber. Cannot assess for dissection given phase of contrast tailored to pulmonary artery evaluation. Mediastinum/Nodes: Calcified subcarinal lymph nodes. No noncalcified adenopathy. Heterogeneous enlargement of the thyroid gland calcifications on the left extending substernal. No dominant nodule, however evaluation is technically limited. Esophagus is decompressed. Lungs/Pleura: Nodular of bilateral ground-glass opacities throughout both lungs, slightly more prominent on the right. Findings slightly more prominent in the upper lobes. Mild smooth septal thickening. Trachea and mainstem bronchi are patent. No solid pulmonary mass. Small right pleural effusion. Upper Abdomen: Minimal contrast refluxing into the hepatic veins and IVC. No other acute finding. Musculoskeletal: There are no acute or suspicious osseous abnormalities. Review of the MIP images confirms the above findings. IMPRESSION: 1. No central pulmonary embolus to the proximal segmental level. Distal segmental and subsegmental branches cannot be assessed due to contrast bolus timing and soft tissue attenuation from habitus. 2. Nodular bilateral ground-glass opacities throughout both lungs, slightly more prominent in the upper lobes. Findings may be due to atypical infection (including but not classic for COVID-19) or pulmonary edema. In the setting of septal thickening, cardiomegaly, and small right pleural effusion, pulmonary edema is favored. 3. Mild multi chamber cardiomegaly. Minimal contrast refluxing into the IVC and hepatic veins, suggesting elevated right heart pressures. 4. Heterogeneously enlarged thyroid gland. Left thyroid calcification. No dominant nodule is  seen, however evaluation is technically limited due to soft tissue attenuation from habitus. Consider further evaluation with thyroid ultrasound on a nonemergent basis. Electronically Signed   By: Narda Rutherford M.D.   On: 07/31/2019 18:11   Vas [place] Lower Extremity Venous (dvt)  Result Date: 08/01/2019  Lower Venous Study Indications: Swelling.  Risk Factors: None identified. Limitations: Body habitus and poor ultrasound/tissue interface. Comparison Study: 11/23/2018 - Performing Technologist: Chanda Busing RVT  Examination Guidelines: A complete evaluation includes B-mode imaging, spectral Doppler, color Doppler, and power Doppler as needed of all accessible portions of each vessel. Bilateral testing is considered an integral part of a complete examination. Limited examinations for reoccurring indications may be performed as noted.  +---------+---------------+---------+-----------+----------+--------------+  RIGHT     Compressibility Phasicity Spontaneity Properties Thrombus Aging  +---------+---------------+---------+-----------+----------+--------------+  CFV       Full            Yes       Yes                                    +---------+---------------+---------+-----------+----------+--------------+  SFJ       Full                                                             +---------+---------------+---------+-----------+----------+--------------+  FV Prox   Full                                                             +---------+---------------+---------+-----------+----------+--------------+  FV Mid    Full                                                             +---------+---------------+---------+-----------+----------+--------------+  FV Distal Full            Yes       Yes                                    +---------+---------------+---------+-----------+----------+--------------+  PFV       Full                                                              +---------+---------------+---------+-----------+----------+--------------+  POP       Full            Yes       Yes                                    +---------+---------------+---------+-----------+----------+--------------+  PTV       Full                                                             +---------+---------------+---------+-----------+----------+--------------+  PERO      Full                                                             +---------+---------------+---------+-----------+----------+--------------+   +----+---------------+---------+-----------+----------+--------------+  LEFT Compressibility Phasicity Spontaneity Properties Thrombus Aging  +----+---------------+---------+-----------+----------+--------------+  CFV  Full            Yes       Yes                                    +----+---------------+---------+-----------+----------+--------------+  Summary: Right: There is no evidence of deep vein thrombosis in the lower extremity. However, portions of this examination were limited- see technologist comments above. No cystic structure found in the popliteal fossa. Left: No evidence of common femoral vein obstruction.  *See table(s) above for measurements and observations. Electronically signed by Sherald Hesshristopher Clark MD on 08/01/2019 at 3:19:23 PM.    Final      CBC Recent Labs  Lab 07/31/19 1247 07/31/19 2326 08/01/19 0453  WBC 6.5 7.9 7.5  HGB 12.7* 13.0 12.4*  HCT 41.3 42.0 40.4  PLT 248 243 248  MCV 87.1 85.7 85.6  MCH 26.8 26.5 26.3  MCHC 30.8 31.0 30.7  RDW 15.5 15.6* 15.6*    Chemistries  Recent Labs  Lab 07/31/19 1247 07/31/19 2326 08/01/19 0453 08/02/19 0544  NA 137  --  136 139  K 4.7  --  4.7 4.5  CL 102  --  103 97*  CO2 25  --  24 30  GLUCOSE 282*  --  338* 237*  BUN 15  --  15 16  CREATININE 1.62* 1.49* 1.52* 1.69*  CALCIUM 9.1  --  9.2 9.6  MG  --   --  2.0  --   AST  --   --  20  --   ALT  --   --  19  --   ALKPHOS  --   --  92  --     BILITOT  --   --  1.0  --    ------------------------------------------------------------------------------------------------------------------ No results for input(s): CHOL, HDL, LDLCALC, TRIG, CHOLHDL, LDLDIRECT in the last 72 hours.  Lab Results  Component Value Date   HGBA1C 9.7 (H) 08/01/2019   ------------------------------------------------------------------------------------------------------------------ No results for input(s): TSH, T4TOTAL, T3FREE, THYROIDAB in the last 72 hours.  Invalid input(s): FREET3 ------------------------------------------------------------------------------------------------------------------ No results for input(s): VITAMINB12, FOLATE, FERRITIN, TIBC, IRON, RETICCTPCT in the last 72 hours.  Coagulation profile No results for input(s): INR, PROTIME in the last 168 hours.  No results for input(s): DDIMER in the last 72 hours.  Cardiac Enzymes No results for input(s): CKMB, TROPONINI, MYOGLOBIN in the last 168 hours.  Invalid input(s): CK ------------------------------------------------------------------------------------------------------------------    Component Value Date/Time   BNP 549.0 (H) 07/31/2019 1535     Shon Haleourage Cambry Spampinato M.D on 08/02/2019 at 7:01 PM  Go to [email or web address] - for contact info  [facility] - Office  [phone]

## 2019-08-02 NOTE — Consult Note (Signed)
CONSULTATION NOTE   Patient Name: Gary AcresCurtis Vetter Date of Encounter: 08/02/2019 Cardiologist: No primary care provider on file.  Chief Complaint   Shortness of breath  Patient Profile   48 yo male with shortness of breath, found to have newly diagnosed systolic CHF with LVEF 20-25%  HPI   Gary Jacobs is a 48 y.o. male who is being seen today for the evaluation of dyspnea at the request of Dr. Mariea ClontsEmokpae. This is a 48 year old male with history of type 2 diabetes, hypertension, dyslipidemia, obstructive sleep apnea on CPAP and class III obesity, who presented with subacute shortness of breath, orthopnea and dyspnea on exertion.  He reports 2 to 3 weeks of worsening shortness of breath and lower extremity edema.  He apparently saw his primary care provider had a chest x-ray several days ago which was not remarkable.  He also complained of some leg swelling he thought it might be related to prior history of DVT.  Presented to the emergency department was found to be in mild respiratory failure.  CT of the chest showed groundglass opacities more suggestive of pulmonary edema.  549.  High-sensitivity troponin is also mildly elevated 186 and 169, more consistent with demand ischemia.  Creatinine was initially elevated however has come down slightly.  He has been aggressively diuresed now about 5.7 L with an additional 2.5 L today.  Echo showed LVEF 20 to 25% with severe global hypokinesis and normal LV wall thickness.  The left ventricle was moderately dilated.  There was low normal RV systolic function.  Severe left atrial and moderate right atrial enlargement were noted.  LV filling pressures are elevated.  PMHx   Past Medical History:  Diagnosis Date   Diabetes mellitus without complication (HCC)    Dyslipidemia    Hypertension    Sleep apnea     History reviewed. No pertinent surgical history.  FAMHx   Family History  Problem Relation Age of Onset   Heart disease Mother [id number]         died at childbirth    SOCHx    reports that he has never smoked. He has never used smokeless tobacco. He reports previous alcohol use. He reports previous drug use.  Outpatient Medications   No current facility-administered medications on file prior to encounter.    Current Outpatient Medications on File Prior to Encounter  Medication Sig Dispense Refill   atorvastatin (LIPITOR) 40 MG tablet Take 40 mg by mouth at bedtime.     Dulaglutide (TRULICITY) 1.5 MG/0.5ML SOPN Inject 1.5 mg into the skin once a week.     lisinopril (ZESTRIL) 10 MG tablet Take 10 mg by mouth daily.     metFORMIN (GLUCOPHAGE) 500 MG tablet Take 500 mg by mouth 2 (two) times daily with a meal.     Multiple Vitamin (MULTIVITAMIN WITH MINERALS) TABS tablet Take 1 tablet by mouth daily.     Vitamin D, Ergocalciferol, (DRISDOL) 1.25 MG (50000 UT) CAPS capsule Take 50,000 Units by mouth every 7 (seven) days.      Inpatient Medications    Scheduled Meds:  albuterol  5 mg Nebulization Once   aspirin EC  81 mg Oral Daily   atorvastatin  40 mg Oral QHS   enoxaparin (LOVENOX) injection  80 mg Subcutaneous Q24H   furosemide  60 mg Intravenous Q12H   insulin aspart  0-15 Units Subcutaneous TID WC   insulin aspart  0-5 Units Subcutaneous QHS   lisinopril  10 mg Oral Daily  sodium chloride flush  3 mL Intravenous Q12H    Continuous Infusions:  sodium chloride      PRN Meds: sodium chloride, acetaminophen, ondansetron (ZOFRAN) IV, sodium chloride flush   ALLERGIES   No Known Allergies  ROS   Pertinent items noted in HPI and remainder of comprehensive ROS otherwise negative.  Vitals   Vitals:   08/01/19 1935 08/02/19 0025 08/02/19 0512 08/02/19 0748  BP: (!) 143/96 (!) 147/83 (!) 125/92 (!) 133/97  Pulse: (!) 109 (!) 103 89 77  Resp: Temp: 97.7 F (36.5 C) 98 F (36.7 C) 98.3 F (36.8 C) 99.6 F (37.6 C)  TempSrc: Oral  Oral Oral  SpO2: 98% 99% 100% 94%   Weight:   (!) 161.9 kg     Intake/Output Summary (Last 24 hours) at 08/02/2019 1228 Last data filed at 08/02/2019 1225 Gross per 24 hour  Intake 700 ml  Output 7500 ml  Net -6800 ml   Filed Weights   08/02/19 0512  Weight: (!) 161.9 kg    Physical Exam   General appearance: alert, no distress and morbidly obese Neck: JVD - 3 cm above sternal notch, no carotid bruit and thyroid not enlarged, symmetric, no tenderness/mass/nodules Lungs: clear to auscultation bilaterally Heart: regular rate and rhythm, S1, S2 normal and systolic murmur: holosystolic 3/6, blowing at apex Abdomen: soft, non-tender; bowel sounds normal; no masses,  no organomegaly and obese Extremities: edema 2+ RLE and 1+ LLE edema Pulses: 2+ and symmetric Skin: Skin color, texture, turgor normal. No rashes or lesions Neurologic: Grossly normal Psych: Pleasant  Labs   Results for orders placed or performed during the hospital encounter of 07/31/19 (from the past 48 hour(s))  Basic metabolic panel     Status: Abnormal   Collection Time: 07/31/19 12:47 PM  Result Value Ref Range   Sodium 137 135 - 145 mmol/L   Potassium 4.7 3.5 - 5.1 mmol/L   Chloride 102 98 - 111 mmol/L   CO2 25 22 - 32 mmol/L   Glucose, Bld 282 (H) 70 - 99 mg/dL   BUN 15 6 - 20 mg/dL   Creatinine, Ser 1.61 (H) 0.61 - 1.24 mg/dL   Calcium 9.1 8.9 - 09.6 mg/dL   GFR calc non Af Amer 50 (L) >60 mL/min   GFR calc Af Amer 58 (L) >60 mL/min   Anion gap 10 5 - 15    Comment: Performed at [facility], [address] [place], [place] [address]  CBC     Status: Abnormal   Collection Time: 07/31/19 12:47 PM  Result Value Ref Range   WBC 6.5 4.0 - 10.5 K/uL   RBC 4.74 4.22 - 5.81 MIL/uL   Hemoglobin 12.7 (L) 13.0 - 17.0 g/dL   HCT 98.1 19.1 - 47.8 %   MCV 87.1 80.0 - 100.0 fL   MCH 26.8 26.0 - 34.0 pg   MCHC 30.8 30.0 - 36.0 g/dL   RDW 29.5 62.1 - 30.8 %   Platelets 248 150 - 400 K/uL   nRBC 0.0 0.0 - 0.2 %    Comment: Performed at  [facility], [address] [place], [place] [address]  Troponin I (High Sensitivity)     Status: Abnormal   Collection Time: 07/31/19 12:47 PM  Result Value Ref Range   Troponin I (High Sensitivity) 186 (HH) <18 ng/L    Comment: CRITICAL RESULT CALLED TO, READ BACK BY AND VERIFIED WITH: Georgie Chard RN  [id number] BY A BENNETT (NOTE) Elevated high sensitivity troponin I (hsTnI) values and significant  changes across serial measurements may suggest ACS but many other  chronic and acute conditions are known to elevate hsTnI results.  Refer to the Links section for chest pain algorithms and additional  guidance. Performed at [facility], [address], [place], [place] [address]   Troponin I (High Sensitivity)     Status: Abnormal   Collection Time: 07/31/19  3:34 PM  Result Value Ref Range   Troponin I (High Sensitivity) 169 (HH) <18 ng/L    Comment: CRITICAL VALUE NOTED.  VALUE IS CONSISTENT WITH PREVIOUSLY REPORTED AND CALLED VALUE. (NOTE) Elevated high sensitivity troponin I (hsTnI) values and significant  changes across serial measurements may suggest ACS but many other  chronic and acute conditions are known to elevate hsTnI results.  Refer to the Links section for chest pain algorithms and additional  guidance. Performed at [facility], [address], [place], [place] [address]   Brain natriuretic peptide     Status: Abnormal   Collection Time: 07/31/19  3:35 PM  Result Value Ref Range   B Natriuretic Peptide 549.0 (H) 0.0 - 100.0 pg/mL    Comment: Performed at [facility], [address] [place], [place] [address]  SARS CORONAVIRUS 2 (TAT 6-24 HRS) Nasopharyngeal Nasopharyngeal Swab     Status: None   Collection Time: 07/31/19  5:53 PM   Specimen: Nasopharyngeal Swab  Result Value Ref Range   SARS Coronavirus 2 NEGATIVE NEGATIVE    Comment: (NOTE) SARS-CoV-2 target nucleic acids are NOT DETECTED. The SARS-CoV-2 RNA is generally  detectable in upper and lower respiratory specimens during the acute phase of infection. Negative results do not preclude SARS-CoV-2 infection, do not rule out co-infections with other pathogens, and should not be used as the sole basis for treatment or other patient management decisions. Negative results must be combined with clinical observations, patient history, and epidemiological information. The expected result is Negative. Fact Sheet for Patients: HairSlick.no Fact Sheet for Healthcare Providers: quierodirigir.com This test is not yet approved or cleared by the [place] FDA and  has been authorized for detection and/or diagnosis of SARS-CoV-2 by FDA under an Emergency Use Authorization (EUA). This EUA will remain  in effect (meaning this test can be used) for the duration of the COVID-19 declaration under Section 56 4(b)(1) of the Act, 21 U.S.C. section 360bbb-3(b)(1), unless the authorization is terminated or revoked sooner. Performed at [facility], [address], [place], [place] [address]   HIV Antibody (routine testing w rflx)     Status: None   Collection Time: 07/31/19 11:26 PM  Result Value Ref Range   HIV Screen 4th Generation wRfx NON REACTIVE NON REACTIVE    Comment: Performed at [facility], [address], [place], [place] [address]  CBC     Status: Abnormal   Collection Time: 07/31/19 11:26 PM  Result Value Ref Range   WBC 7.9 4.0 - 10.5 K/uL   RBC 4.90 4.22 - 5.81 MIL/uL   Hemoglobin 13.0 13.0 - 17.0 g/dL   HCT 46.9 62.9 - 52.8 %   MCV 85.7 80.0 - 100.0 fL   MCH 26.5 26.0 - 34.0 pg   MCHC 31.0 30.0 - 36.0 g/dL   RDW 41.3 (H) 24.4 - 01.0 %   Platelets 243 150 - 400 K/uL   nRBC 0.0 0.0 - 0.2 %  Comment: Performed at [facility], [address] [place], [place] [address]  Creatinine, serum     Status: Abnormal   Collection Time: 07/31/19 11:26 PM  Result Value Ref Range    Creatinine, Ser 1.49 (H) 0.61 - 1.24 mg/dL   GFR calc non Af Amer 55 (L) >60 mL/min   GFR calc Af Amer >60 >60 mL/min    Comment: Performed at [facility], [address], [place], [place] [address]  CBG monitoring, ED     Status: Abnormal   Collection Time: 08/01/19 12:31 AM  Result Value Ref Range   Glucose-Capillary 171 (H) 70 - 99 mg/dL  Comprehensive metabolic panel     Status: Abnormal   Collection Time: 08/01/19  4:53 AM  Result Value Ref Range   Sodium 136 135 - 145 mmol/L   Potassium 4.7 3.5 - 5.1 mmol/L   Chloride 103 98 - 111 mmol/L   CO2 24 22 - 32 mmol/L   Glucose, Bld 338 (H) 70 - 99 mg/dL   BUN 15 6 - 20 mg/dL   Creatinine, Ser 0.98 (H) 0.61 - 1.24 mg/dL   Calcium 9.2 8.9 - 11.9 mg/dL   Total Protein 6.1 (L) 6.5 - 8.1 g/dL   Albumin 3.4 (L) 3.5 - 5.0 g/dL   AST 20 15 - 41 U/L   ALT 19 0 - 44 U/L   Alkaline Phosphatase 92 38 - 126 U/L   Total Bilirubin 1.0 0.3 - 1.2 mg/dL   GFR calc non Af Amer 54 (L) >60 mL/min   GFR calc Af Amer >60 >60 mL/min   Anion gap 9 5 - 15    Comment: Performed at [facility], [address], [place], [place] [address]  CBC     Status: Abnormal   Collection Time: 08/01/19  4:53 AM  Result Value Ref Range   WBC 7.5 4.0 - 10.5 K/uL   RBC 4.72 4.22 - 5.81 MIL/uL   Hemoglobin 12.4 (L) 13.0 - 17.0 g/dL   HCT 95.6 21.3 - 08.6 %   MCV 85.6 80.0 - 100.0 fL   MCH 26.3 26.0 - 34.0 pg   MCHC 30.7 30.0 - 36.0 g/dL   RDW 57.8 (H) 46.9 - 62.9 %   Platelets 248 150 - 400 K/uL   nRBC 0.0 0.0 - 0.2 %    Comment: Performed at [facility], [address] [place], [place] [address]  Magnesium     Status: None   Collection Time: 08/01/19  4:53 AM  Result Value Ref Range   Magnesium 2.0 1.7 - 2.4 mg/dL    Comment: Performed at [facility], [address] [place], [place] [address]  Phosphorus     Status: None   Collection Time: 08/01/19  4:53 AM  Result Value Ref Range   Phosphorus 3.6 2.5 - 4.6 mg/dL    Comment:  Performed at [facility], [address], [place], [place] [address]  Hemoglobin A1c     Status: Abnormal   Collection Time: 08/01/19  4:53 AM  Result Value Ref Range   Hgb A1c MFr Bld 9.7 (H) 4.8 - 5.6 %    Comment: (NOTE) Pre diabetes:          5.7%-6.4% Diabetes:              >6.4% Glycemic control for   <7.0% adults with diabetes    Mean Plasma Glucose 231.69 mg/dL  Comment: Performed at [facility], [place] [address], [place], [place] [address]  CBG monitoring, ED     Status: Abnormal   Collection Time: 08/01/19  8:02 AM  Result Value Ref Range   Glucose-Capillary 284 (H) 70 - 99 mg/dL   Comment 1 Notify RN    Comment 2 Document in Chart   CBG monitoring, ED     Status: Abnormal   Collection Time: 08/01/19 11:54 AM  Result Value Ref Range   Glucose-Capillary 218 (H) 70 - 99 mg/dL   Comment 1 Notify RN    Comment 2 Document in Chart   Glucose, capillary     Status: Abnormal   Collection Time: 08/01/19  5:21 PM  Result Value Ref Range   Glucose-Capillary 255 (H) 70 - 99 mg/dL  Glucose, capillary     Status: Abnormal   Collection Time: 08/01/19  8:59 PM  Result Value Ref Range   Glucose-Capillary 230 (H) 70 - 99 mg/dL  Basic metabolic panel     Status: Abnormal   Collection Time: 08/02/19  5:44 AM  Result Value Ref Range   Sodium 139 135 - 145 mmol/L   Potassium 4.5 3.5 - 5.1 mmol/L   Chloride 97 (L) 98 - 111 mmol/L   CO2 30 22 - 32 mmol/L   Glucose, Bld 237 (H) 70 - 99 mg/dL   BUN 16 6 - 20 mg/dL   Creatinine, Ser 1.69 (H) 0.61 - 1.24 mg/dL   Calcium 9.6 8.9 - 10.3 mg/dL   GFR calc non Af Amer 47 (L) >60 mL/min   GFR calc Af Amer 55 (L) >60 mL/min   Anion gap 12 5 - 15    Comment: Performed at [facility], [place] [address] [place], [place] [address]  Glucose, capillary     Status: Abnormal   Collection Time: 08/02/19  7:45 AM  Result Value Ref Range   Glucose-Capillary 238 (H) 70 - 99 mg/dL    ECG   N/A - Personally Reviewed  Telemetry    Sinus rhythm - Personally Reviewed  Radiology   Dg Chest 2 View  Result Date: 07/31/2019 CLINICAL DATA:  Shortness of breath. EXAM: CHEST - 2 VIEW COMPARISON:  . FINDINGS: Cardiomegaly. Mild pulmonary venous congestion bilateral interstitial prominence noted. CHF could present in this fashion. Right upper lobe alveolar infiltrate. This could represent focal pulmonary edema and/or pneumonia. No pleural effusion or pneumothorax. Degenerative change thoracic spine. IMPRESSION: 1. Cardiomegaly with pulmonary venous congestion and bilateral interstitial prominence suggesting CHF. 2. Right upper lobe alveolar infiltrate noted. This could represent focal edema and or pneumonia. Electronically Signed   By: Marcello Moores  Register   On: 07/31/2019 13:00   Ct Angio Chest Pe W/cm &/or Wo Cm  Result Date: 07/31/2019 CLINICAL DATA:  PE suspected, high pretest prob. Shortness of breath. EXAM: CT ANGIOGRAPHY CHEST WITH CONTRAST TECHNIQUE: Multidetector CT imaging of the chest was performed using the standard protocol during bolus administration of intravenous contrast. Multiplanar CT image reconstructions and MIPs were obtained to evaluate the vascular anatomy. CONTRAST:  129mL OMNIPAQUE IOHEXOL 350 MG/ML SOLN COMPARISON:  Radiograph earlier this day. FINDINGS: Cardiovascular: There are no filling defects within the central pulmonary arteries to suggest pulmonary embolus. Evaluation is diagnostic to the proximal segmental level. Distal segmental and subsegmental branches cannot be assessed due to contrast bolus timing and soft tissue attenuation from habitus. Mild multi chamber cardiomegaly. Minimal contrast refluxes into the IVC and hepatic veins. Thoracic aorta is normal  in caliber. Cannot assess for dissection given phase of contrast tailored to pulmonary artery evaluation. Mediastinum/Nodes: Calcified subcarinal lymph nodes. No noncalcified adenopathy. Heterogeneous enlargement of the thyroid gland calcifications on  the left extending substernal. No dominant nodule, however evaluation is technically limited. Esophagus is decompressed. Lungs/Pleura: Nodular of bilateral ground-glass opacities throughout both lungs, slightly more prominent on the right. Findings slightly more prominent in the upper lobes. Mild smooth septal thickening. Trachea and mainstem bronchi are patent. No solid pulmonary mass. Small right pleural effusion. Upper Abdomen: Minimal contrast refluxing into the hepatic veins and IVC. No other acute finding. Musculoskeletal: There are no acute or suspicious osseous abnormalities. Review of the MIP images confirms the above findings. IMPRESSION: 1. No central pulmonary embolus to the proximal segmental level. Distal segmental and subsegmental branches cannot be assessed due to contrast bolus timing and soft tissue attenuation from habitus. 2. Nodular bilateral ground-glass opacities throughout both lungs, slightly more prominent in the upper lobes. Findings may be due to atypical infection (including but not classic for COVID-19) or pulmonary edema. In the setting of septal thickening, cardiomegaly, and small right pleural effusion, pulmonary edema is favored. 3. Mild multi chamber cardiomegaly. Minimal contrast refluxing into the IVC and hepatic veins, suggesting elevated right heart pressures. 4. Heterogeneously enlarged thyroid gland. Left thyroid calcification. No dominant nodule is seen, however evaluation is technically limited due to soft tissue attenuation from habitus. Consider further evaluation with thyroid ultrasound on a nonemergent basis. Electronically Signed   By: Narda Rutherford M.D.   On: 07/31/2019 18:11   Vas [place] Lower Extremity Venous (dvt)  Result Date: 08/01/2019  Lower Venous Study Indications: Swelling.  Risk Factors: None identified. Limitations: Body habitus and poor ultrasound/tissue interface. Comparison Study: 11/23/2018 - Performing Technologist: Chanda Busing RVT  Examination  Guidelines: A complete evaluation includes B-mode imaging, spectral Doppler, color Doppler, and power Doppler as needed of all accessible portions of each vessel. Bilateral testing is considered an integral part of a complete examination. Limited examinations for reoccurring indications may be performed as noted.  +---------+---------------+---------+-----------+----------+--------------+  RIGHT     Compressibility Phasicity Spontaneity Properties Thrombus Aging  +---------+---------------+---------+-----------+----------+--------------+  CFV       Full            Yes       Yes                                    +---------+---------------+---------+-----------+----------+--------------+  SFJ       Full                                                             +---------+---------------+---------+-----------+----------+--------------+  FV Prox   Full                                                             +---------+---------------+---------+-----------+----------+--------------+  FV Mid    Full                                                             +---------+---------------+---------+-----------+----------+--------------+  FV Distal Full            Yes       Yes                                    +---------+---------------+---------+-----------+----------+--------------+  PFV       Full                                                             +---------+---------------+---------+-----------+----------+--------------+  POP       Full            Yes       Yes                                    +---------+---------------+---------+-----------+----------+--------------+  PTV       Full                                                             +---------+---------------+---------+-----------+----------+--------------+  PERO      Full                                                             +---------+---------------+---------+-----------+----------+--------------+    +----+---------------+---------+-----------+----------+--------------+  LEFT Compressibility Phasicity Spontaneity Properties Thrombus Aging  +----+---------------+---------+-----------+----------+--------------+  CFV  Full            Yes       Yes                                    +----+---------------+---------+-----------+----------+--------------+     Summary: Right: There is no evidence of deep vein thrombosis in the lower extremity. However, portions of this examination were limited- see technologist comments above. No cystic structure found in the popliteal fossa. Left: No evidence of common femoral vein obstruction.  *See table(s) above for measurements and observations. Electronically signed by Sherald Hesshristopher Clark MD on 08/01/2019 at 3:19:23 PM.    Final     Cardiac Studies   Procedure: 2D Echo  Indications:    CHF-Acute Diastolic 428.31 / I50.31   History:        Patient has no prior history of Echocardiogram examinations.                 Risk Factors:Obesity, Hypertension, Diabetes and Sleep Apnea.                 Sleep apnea                 Elevated troponin                 Acute kidney injury.   Sonographer:    Leeroy Bockhelsea Turrentine Referring Phys: [phone]  OLADAPO ADEFESO    Sonographer Comments: Image acquisition challenging due to patient body habitus. IMPRESSIONS    1. Left ventricular ejection fraction, by visual estimation, is 20 to 25%. The left ventricle has severely decreased function. There is no left ventricular hypertrophy.  2. Indeterminate diastolic filling due to E-A fusion.  3. Moderately dilated left ventricular internal cavity size.  4. The left ventricle demonstrates global hypokinesis.  5. Global right ventricle has low normal systolic function.The right ventricular size is normal. No increase in right ventricular wall thickness.  6. Left atrial size was severely dilated.  7. Right atrial size was moderately dilated.  8. The mitral valve is normal in  structure. Mild to moderate mitral valve regurgitation.  9. The tricuspid valve is normal in structure. Tricuspid valve regurgitation mild-moderate. 10. The aortic valve is normal in structure. Aortic valve regurgitation is trivial. 11. The pulmonic valve was normal in structure. Pulmonic valve regurgitation is not visualized. 12. Mildly elevated pulmonary artery systolic pressure  Impression   Principal Problem:   Acute HFrEF (heart failure with reduced ejection fraction) /EF 20 to 25 % Active Problems:   CHF (congestive heart failure) (HCC)   Type 2 diabetes mellitus with hyperlipidemia (HCC)   Essential hypertension   AKI (acute kidney injury) (HCC)   Sleep apnea   Obesity, Class III, BMI 40-49.9 (morbid obesity) (HCC)   Elevated troponin I level   Hyperglycemia   Elevated serum creatinine   Recommendation   1. Mr. Bihl has new onset systolic congestive heart failure with LVEF 20 to 25%.  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  Underlying coronary artery disease needs to be excluded.  Given his body habitus, is not a good candidate for CT coronary angiography or nuclear stress testing.  I would favor left and right heart catheterization early next week.  This will also allow [place] to determine filling pressures after diuresis and cardiac output.  I believe his elevated troponin is likely due to demand ischemia probably related to heart failure.  It does appear to be stable and mildly elevated.  He is aggressively diuresing with stable renal function.  Can decrease IV Lasix to 40 mg twice daily.  Symptomatically he is improved.  As he is more compensated, will start carvedilol for heart failure and blood pressure as well as hold lisinopril.  This will allow [place] to wash out the dose with the plan of  starting Entresto prior to discharge after cath.  Also start low-dose Aldactone 12.5 mg daily.  Thanks for the consultation.  Cardiology will follow with you.  Time Spent Directly with Patient:  I have spent a total of 45 minutes with the patient reviewing hospital notes, telemetry, EKGs, labs and examining the patient as well as establishing an assessment and plan that was discussed personally with the patient.  > 50% of time was spent in direct patient care.  Length of Stay:  LOS: 2 days   Chrystie Nose, MD, [facility], FACP  [place]   [facility]  Medical Director of the Advanced Lipid Disorders &  Cardiovascular Risk [facility] Diplomate of the American Board of Clinical Lipidology Attending Cardiologist  Direct Dial: [phone]   Fax: [phone]  Website:  [email or web address] Armend [email or web address] 08/02/2019, 12:28 PM

## 2019-08-03 DIAGNOSIS — R778 Other specified abnormalities of plasma proteins: Secondary | ICD-10-CM

## 2019-08-03 DIAGNOSIS — G4733 Obstructive sleep apnea (adult) (pediatric): Secondary | ICD-10-CM

## 2019-08-03 LAB — BASIC METABOLIC PANEL
Anion gap: 12 (ref 5–15)
BUN: 16 mg/dL (ref 6–20)
CO2: 31 mmol/L (ref 22–32)
Calcium: 9.5 mg/dL (ref 8.9–10.3)
Chloride: 96 mmol/L — ABNORMAL LOW (ref 98–111)
Creatinine, Ser: 1.61 mg/dL — ABNORMAL HIGH (ref 0.61–1.24)
GFR calc Af Amer: 58 mL/min — ABNORMAL LOW (ref >60)
GFR calc non Af Amer: 50 mL/min — ABNORMAL LOW (ref >60)
Glucose, Bld: 210 mg/dL — ABNORMAL HIGH (ref 70–99)
Potassium: 4.5 mmol/L (ref 3.5–5.1)
Sodium: 139 mmol/L (ref 135–145)

## 2019-08-03 LAB — GLUCOSE, CAPILLARY
Glucose-Capillary: 207 mg/dL — ABNORMAL HIGH (ref 70–99)
Glucose-Capillary: 333 mg/dL — ABNORMAL HIGH (ref 70–99)
Glucose-Capillary: 289 mg/dL — ABNORMAL HIGH (ref 70–99)
Glucose-Capillary: 269 mg/dL — ABNORMAL HIGH (ref 70–99)

## 2019-08-03 MED ORDER — INSULIN GLARGINE 100 UNIT/ML ~~LOC~~ SOLN
10.0000 [IU] | Freq: Every day | SUBCUTANEOUS | Status: DC
  Administered 2019-08-03: 10 [IU] via SUBCUTANEOUS
  Filled 2019-08-03 (×3): qty 0.1

## 2019-08-03 NOTE — Progress Notes (Signed)
PROGRESS NOTE    Gary Jacobs  WUJ:[id number] DOB: [birth year] DOA: 07/31/2019 PCP: Caffie Damme, MD   Brief Narrative:  48 y.o.malewith medical history significant fortype 2 diabetes mellitus, hypertension, hyperlipidemia, sleep apnea on CPAP at night and obesity admitted on 07/31/2019 with acute CHF exacerbation-echo showed acute systolic congestive heart failure with ejection fraction of 20 to 25%.  He was started on diuretics.  Cardiology was consulted.  Awaiting LHC and RHC this admission to Rule out ischemic cardiomyopathy and evaluate right-sided pressures  Assessment & Plan:   Principal Problem:   Acute HFrEF (heart failure with reduced ejection fraction) /EF 20 to 25 % Active Problems:   CHF (congestive heart failure) (HCC)   Type 2 diabetes mellitus with hyperlipidemia (HCC)   Essential hypertension   AKI (acute kidney injury) (HCC)   Sleep apnea   Obesity, Class III, BMI 40-49.9 (morbid obesity) (HCC)   Elevated troponin I level   Hyperglycemia   Elevated serum creatinine  Acute systolic congestive heart failure: Echo shows 20 to 25% ejection fraction.  Has had good diuresis so far with total negative balance of 9912 mL.  He is on Lasix 40 mg IV twice daily.  Continue daily weights, strict I's and O's.  Lower extremity Doppler ruled out DVT.  Cardiology on board.  Plan for left and right heart cath tomorrow morning.  Type 2 diabetes mellitus: Takes only Metformin at home.  Here he is at sliding scale insulin.  Has required approximately 17 units of NovoLog in last 24 hours.  Hyperglycemic.  Will start on Lantus 10 units and continue SSI.  Essential hypertension: Blood pressure controlled.  Continue Coreg, Lasix and Aldactone.  Lisinopril on hold due to AKI.  AKI vs possible CKD, stage unknown: Creatinine on admission 1.62.  It bumped to 1.69 and now back to 1.61.  Previous creatinine not known.  Avoiding nephrotoxic agents and hoping that this will improve.   Hyperlipidemia: Lipitor and aspirin.  Morbid obesity with OSA: CPAP at night.  Complicates all care.  DVT prophylaxis: Lovenox Code Status: Full code Family Communication:  None present at bedside.  Plan of care discussed with patient in length and he verbalized understanding and agreed with it. Disposition Plan: Home once cleared by cardiology  There is no height or weight on file to calculate BMI.      Nutritional status:               Consultants:   Cardiology  Procedures:   None  Antimicrobials:   None   Subjective: Patient seen and examined.  No complaints.  No shortness of breath or chest pain.  Objective: Vitals:   08/02/19 1725 08/02/19 2116 08/03/19 0521 08/03/19 1153  BP: 116/85 (!) 125/104 (!) 136/103 107/83  Pulse: (!) 103 (!) 101 96 99  Resp: 20 18 18  (!) 22  Temp:   (!) 97.5 F (36.4 C) 98 F (36.7 C)  TempSrc:   Oral Oral  SpO2:  98% 100% 99%  Weight:   (!) 160.4 kg     Intake/Output Summary (Last 24 hours) at 08/03/2019 1309 Last data filed at 08/03/2019 1021 Gross per 24 hour  Intake 603 ml  Output 2300 ml  Net -1697 ml   Filed Weights   08/02/19 0512 08/03/19 0521  Weight: (!) 161.9 kg (!) 160.4 kg    Examination:  General exam: Appears calm and comfortable  Respiratory system: Clear to auscultation. Respiratory effort normal. Cardiovascular system: S1 & S2 heard, RRR. No  JVD, murmurs, rubs, gallops or clicks.  Trace pitting edema left lower extremity, +1 pitting edema right lower extremity. Gastrointestinal system: Abdomen is nondistended, soft and nontender. No organomegaly or masses felt. Normal bowel sounds heard. Central nervous system: Alert and oriented. No focal neurological deficits. Extremities: Symmetric 5 x 5 power. Skin: No rashes, lesions or ulcers Psychiatry: Judgement and insight appear normal. Mood & affect appropriate.    Data Reviewed: I have personally reviewed following labs and imaging studies   CBC: Recent Labs  Lab 07/31/19 1247 07/31/19 2326 08/01/19 0453  WBC 6.5 7.9 7.5  HGB 12.7* 13.0 12.4*  HCT 41.3 42.0 40.4  MCV 87.1 85.7 85.6  PLT 248 243 379   Basic Metabolic Panel: Recent Labs  Lab 07/31/19 1247 07/31/19 2326 08/01/19 0453 08/02/19 0544 08/03/19 0429  NA 137  --  136 139 139  K 4.7  --  4.7 4.5 4.5  CL 102  --  103 97* 96*  CO2 25  --  24 30 31   GLUCOSE 282*  --  338* 237* 210*  BUN 15  --  15 16 16   CREATININE 1.62* 1.49* 1.52* 1.69* 1.61*  CALCIUM 9.1  --  9.2 9.6 9.5  MG  --   --  2.0  --   --   PHOS  --   --  3.6  --   --    GFR: CrCl cannot be calculated (Unknown ideal weight.). Liver Function Tests: Recent Labs  Lab 08/01/19 0453  AST 20  ALT 19  ALKPHOS 92  BILITOT 1.0  PROT 6.1*  ALBUMIN 3.4*   No results for input(s): LIPASE, AMYLASE in the last 168 hours. No results for input(s): AMMONIA in the last 168 hours. Coagulation Profile: No results for input(s): INR, PROTIME in the last 168 hours. Cardiac Enzymes: No results for input(s): CKTOTAL, CKMB, CKMBINDEX, TROPONINI in the last 168 hours. BNP (last 3 results) No results for input(s): PROBNP in the last 8760 hours. HbA1C: Recent Labs    08/01/19 0453  HGBA1C 9.7*   CBG: Recent Labs  Lab 08/02/19 1215 08/02/19 1724 08/02/19 2119 08/03/19 0628 08/03/19 1150  GLUCAP 299* 225* 347* 207* 333*   Lipid Profile: No results for input(s): CHOL, HDL, LDLCALC, TRIG, CHOLHDL, LDLDIRECT in the last 72 hours. Thyroid Function Tests: No results for input(s): TSH, T4TOTAL, FREET4, T3FREE, THYROIDAB in the last 72 hours. Anemia Panel: No results for input(s): VITAMINB12, FOLATE, FERRITIN, TIBC, IRON, RETICCTPCT in the last 72 hours. Sepsis Labs: No results for input(s): PROCALCITON, LATICACIDVEN in the last 168 hours.  Recent Results (from the past 240 hour(s))  SARS CORONAVIRUS 2 (TAT 6-24 HRS) Nasopharyngeal Nasopharyngeal Swab     Status: None   Collection Time: 07/31/19   5:53 PM   Specimen: Nasopharyngeal Swab  Result Value Ref Range Status   SARS Coronavirus 2 NEGATIVE NEGATIVE Final    Comment: (NOTE) SARS-CoV-2 target nucleic acids are NOT DETECTED. The SARS-CoV-2 RNA is generally detectable in upper and lower respiratory specimens during the acute phase of infection. Negative results do not preclude SARS-CoV-2 infection, do not rule out co-infections with other pathogens, and should not be used as the sole basis for treatment or other patient management decisions. Negative results must be combined with clinical observations, patient history, and epidemiological information. The expected result is Negative. Fact Sheet for Patients: SugarRoll.be Fact Sheet for Healthcare Providers: https://www.woods-mathews.com/ This test is not yet approved or cleared by the [place] FDA and  has  been authorized for detection and/or diagnosis of SARS-CoV-2 by FDA under an Emergency Use Authorization (EUA). This EUA will remain  in effect (meaning this test can be used) for the duration of the COVID-19 declaration under Section 56 4(b)(1) of the Act, 21 U.S.C. section 360bbb-3(b)(1), unless the authorization is terminated or revoked sooner. Performed at [facility], [address] [place], [place] [address]       Radiology Studies: No results found.  Scheduled Meds: . albuterol  5 mg Nebulization Once  . aspirin EC  81 mg Oral Daily  . atorvastatin  40 mg Oral QHS  . carvedilol  6.25 mg Oral BID WC  . enoxaparin (LOVENOX) injection  80 mg Subcutaneous Q24H  . furosemide  40 mg Intravenous Q12H  . insulin aspart  0-15 Units Subcutaneous TID WC  . insulin aspart  0-5 Units Subcutaneous QHS  . insulin glargine  10 Units Subcutaneous Daily  . sodium chloride flush  3 mL Intravenous Q12H  . spironolactone  12.5 mg Oral Daily   Continuous Infusions: . sodium chloride       LOS: 3 days   Time spent:  32-minute   Hughie Closs, MD [facility]  08/03/2019, 1:09 PM   To contact the attending provider between 7A-7P or the covering provider during after hours 7P-7A, please log into the web site [email or web address] and use password [email or web address].

## 2019-08-03 NOTE — Progress Notes (Signed)
Patient placed himself on bipap. RT checked to make sure it was on properly. Patient states he is comfortable at this time.

## 2019-08-03 NOTE — Progress Notes (Signed)
DAILY PROGRESS NOTE   Patient Name: Gary Jacobs Date of Encounter: 08/03/2019 Cardiologist: No primary care provider on file.  Chief Complaint   Breathing better  Patient Profile   48 yo male with shortness of breath, found to have newly diagnosed systolic CHF with LVEF 20-25%  Subjective   Excellent diuresis overnight -3.6L Negative, now 10L negative. Creatinine improved to 1.61 (from 1.69). He is breathing better - seen on CPAP today. Creatinine will not support cath just yet.  Objective   Vitals:   08/02/19 1200 08/02/19 1725 08/02/19 2116 08/03/19 0521  BP: 123/83 116/85 (!) 125/104 (!) 136/103  Pulse: (!) 108 (!) 103 (!) 101 96  Resp: 20 20 18 18   Temp: 98.2 F (36.8 C)   (!) 97.5 F (36.4 C)  TempSrc: Oral   Oral  SpO2: 100%  98% 100%  Weight:    (!) 160.4 kg    Intake/Output Summary (Last 24 hours) at 08/03/2019 0957 Last data filed at 08/03/2019 2297 Gross per 24 hour  Intake 600 ml  Output 3825 ml  Net -3225 ml   Filed Weights   08/02/19 0512 08/03/19 0521  Weight: (!) 161.9 kg (!) 160.4 kg    Physical Exam   General appearance: alert, no distress and morbidly obese Neck: no carotid bruit, thyroid not enlarged, symmetric, no tenderness/mass/nodules and thick neck, cannot assess JVP Lungs: diminished breath sounds bilaterally Heart: regular rate and rhythm Abdomen: soft, non-tender; bowel sounds normal; no masses,  no organomegaly and obese Extremities: extremities normal, atraumatic, no cyanosis or edema Pulses: 2+ and symmetric Skin: Skin color, texture, turgor normal. No rashes or lesions Neurologic: Grossly normal Psych: Pleasant  Inpatient Medications    Scheduled Meds:  albuterol  5 mg Nebulization Once   aspirin EC  81 mg Oral Daily   atorvastatin  40 mg Oral QHS   carvedilol  6.25 mg Oral BID WC   enoxaparin (LOVENOX) injection  80 mg Subcutaneous Q24H   furosemide  40 mg Intravenous Q12H   insulin aspart  0-15 Units  Subcutaneous TID WC   insulin aspart  0-5 Units Subcutaneous QHS   sodium chloride flush  3 mL Intravenous Q12H   spironolactone  12.5 mg Oral Daily    Continuous Infusions:  sodium chloride      PRN Meds: sodium chloride, acetaminophen, ondansetron (ZOFRAN) IV, sodium chloride flush   Labs   Results for orders placed or performed during the hospital encounter of 07/31/19 (from the past 48 hour(s))  CBG monitoring, ED     Status: Abnormal   Collection Time: 08/01/19 11:54 AM  Result Value Ref Range   Glucose-Capillary 218 (H) 70 - 99 mg/dL   Comment 1 Notify RN    Comment 2 Document in Chart   Glucose, capillary     Status: Abnormal   Collection Time: 08/01/19  5:21 PM  Result Value Ref Range   Glucose-Capillary 255 (H) 70 - 99 mg/dL  Glucose, capillary     Status: Abnormal   Collection Time: 08/01/19  8:59 PM  Result Value Ref Range   Glucose-Capillary 230 (H) 70 - 99 mg/dL  Basic metabolic panel     Status: Abnormal   Collection Time: 08/02/19  5:44 AM  Result Value Ref Range   Sodium 139 135 - 145 mmol/L   Potassium 4.5 3.5 - 5.1 mmol/L   Chloride 97 (L) 98 - 111 mmol/L   CO2 30 22 - 32 mmol/L   Glucose, Bld 237 (H)  70 - 99 mg/dL   BUN 16 6 - 20 mg/dL   Creatinine, Ser 7.49 (H) 0.61 - 1.24 mg/dL   Calcium 9.6 8.9 - 44.9 mg/dL   GFR calc non Af Amer 47 (L) >60 mL/min   GFR calc Af Amer 55 (L) >60 mL/min   Anion gap 12 5 - 15    Comment: Performed at [facility], [address] [place], [place] [address]  Glucose, capillary     Status: Abnormal   Collection Time: 08/02/19  7:45 AM  Result Value Ref Range   Glucose-Capillary 238 (H) 70 - 99 mg/dL  Glucose, capillary     Status: Abnormal   Collection Time: 08/02/19 12:15 PM  Result Value Ref Range   Glucose-Capillary 299 (H) 70 - 99 mg/dL  Glucose, capillary     Status: Abnormal   Collection Time: 08/02/19  5:24 PM  Result Value Ref Range   Glucose-Capillary 225 (H) 70 - 99 mg/dL  Glucose, capillary      Status: Abnormal   Collection Time: 08/02/19  9:19 PM  Result Value Ref Range   Glucose-Capillary 347 (H) 70 - 99 mg/dL  Basic metabolic panel     Status: Abnormal   Collection Time: 08/03/19  4:29 AM  Result Value Ref Range   Sodium 139 135 - 145 mmol/L   Potassium 4.5 3.5 - 5.1 mmol/L   Chloride 96 (L) 98 - 111 mmol/L   CO2 31 22 - 32 mmol/L   Glucose, Bld 210 (H) 70 - 99 mg/dL   BUN 16 6 - 20 mg/dL   Creatinine, Ser 6.38 (H) 0.61 - 1.24 mg/dL   Calcium 9.5 8.9 - 46.6 mg/dL   GFR calc non Af Amer 50 (L) >60 mL/min   GFR calc Af Amer 58 (L) >60 mL/min   Anion gap 12 5 - 15    Comment: Performed at [facility], [address] [place], [place] [address]  Glucose, capillary     Status: Abnormal   Collection Time: 08/03/19  6:28 AM  Result Value Ref Range   Glucose-Capillary 207 (H) 70 - 99 mg/dL    ECG   N/A - Personally Reviewed  Telemetry   Sinus rhythm - Personally Reviewed  Radiology    Vas [place] Lower Extremity Venous (dvt)  Result Date: 08/01/2019  Lower Venous Study Indications: Swelling.  Risk Factors: None identified. Limitations: Body habitus and poor ultrasound/tissue interface. Comparison Study: 11/23/2018 - Performing Technologist: Chanda Busing RVT  Examination Guidelines: A complete evaluation includes B-mode imaging, spectral Doppler, color Doppler, and power Doppler as needed of all accessible portions of each vessel. Bilateral testing is considered an integral part of a complete examination. Limited examinations for reoccurring indications may be performed as noted.  +---------+---------------+---------+-----------+----------+--------------+  RIGHT     Compressibility Phasicity Spontaneity Properties Thrombus Aging  +---------+---------------+---------+-----------+----------+--------------+  CFV       Full            Yes       Yes                                    +---------+---------------+---------+-----------+----------+--------------+  SFJ        Full                                                             +---------+---------------+---------+-----------+----------+--------------+  FV Prox   Full                                                             +---------+---------------+---------+-----------+----------+--------------+  FV Mid    Full                                                             +---------+---------------+---------+-----------+----------+--------------+  FV Distal Full            Yes       Yes                                    +---------+---------------+---------+-----------+----------+--------------+  PFV       Full                                                             +---------+---------------+---------+-----------+----------+--------------+  POP       Full            Yes       Yes                                    +---------+---------------+---------+-----------+----------+--------------+  PTV       Full                                                             +---------+---------------+---------+-----------+----------+--------------+  PERO      Full                                                             +---------+---------------+---------+-----------+----------+--------------+   +----+---------------+---------+-----------+----------+--------------+  LEFT Compressibility Phasicity Spontaneity Properties Thrombus Aging  +----+---------------+---------+-----------+----------+--------------+  CFV  Full            Yes       Yes                                    +----+---------------+---------+-----------+----------+--------------+     Summary: Right: There is no evidence of deep vein thrombosis in the lower extremity. However, portions of this examination were limited- see technologist comments above. No cystic structure found in the popliteal fossa. Left: No evidence of common femoral vein obstruction.  *See table(s) above for measurements and observations. Electronically signed by Sherald Hesshristopher Clark MD on  08/01/2019  at 3:19:23 PM.    Final     Cardiac Studies   N/A  Assessment   1. Principal Problem: 2.   Acute HFrEF (heart failure with reduced ejection fraction) /EF 20 to 25 % 3. Active Problems: 4.   CHF (congestive heart failure) ([place]) 5.   Type 2 diabetes mellitus with hyperlipidemia ([place]) 6.   Essential hypertension 7.   AKI (acute kidney injury) ([place]) 8.   Sleep apnea 9.   Obesity, Class III, BMI 40-49.9 (morbid obesity) ([place]) 10.   Elevated troponin I level 11.   Hyperglycemia 12.   Elevated serum creatinine 13.   Plan   1. Volume status improving with diuresis. Repeat BMET tomorrow - consider coronary eval if creatinine returns to baseline. May be able to add Entresto if renal function improves. BP had been well-controlled - noted diastolic HTN this am, but suspect this is not accurate.   Time Spent Directly with Patient:  I have spent a total of 25 minutes with the patient reviewing hospital notes, telemetry, EKGs, labs and examining the patient as well as establishing an assessment and plan that was discussed personally with the patient.  > 50% of time was spent in direct patient care.  Length of Stay:  LOS: 3 days   Pixie Casino, MD, [facility], [place] Director of the [facility] Lipid [facility] &  [facility] Diplomate of the American Board of Clinical Lipidology Attending Cardiologist  Direct Dial: [phone]   Fax: [phone]  Website:  [email or web address] Osgood Keyan Folson 08/03/2019, 9:57 AM

## 2019-08-04 ENCOUNTER — Encounter (HOSPITAL_COMMUNITY)
Admission: EM | Disposition: A | Discharge status: Home / Self Care | Payer: Self-pay | Source: Home / Self Care | Attending: Family Medicine

## 2019-08-04 DIAGNOSIS — R7989 Other specified abnormal findings of blood chemistry: Secondary | ICD-10-CM

## 2019-08-04 HISTORY — PX: RIGHT/LEFT HEART CATH AND CORONARY ANGIOGRAPHY: CATH118266

## 2019-08-04 LAB — CBC WITH DIFFERENTIAL/PLATELET
Abs Immature Granulocytes: 0.01 10*3/uL (ref 0.00–0.07)
Basophils Absolute: 0 10*3/uL (ref 0.0–0.1)
Basophils Relative: 1 %
Eosinophils Absolute: 0.1 10*3/uL (ref 0.0–0.5)
Eosinophils Relative: 3 %
HCT: 43.9 % (ref 39.0–52.0)
Hemoglobin: 13.6 g/dL (ref 13.0–17.0)
Immature Granulocytes: 0 %
Lymphocytes Relative: 31 %
Lymphs Abs: 1.4 10*3/uL (ref 0.7–4.0)
MCH: 26.2 pg (ref 26.0–34.0)
MCHC: 31 g/dL (ref 30.0–36.0)
MCV: 84.6 fL (ref 80.0–100.0)
Monocytes Absolute: 0.6 10*3/uL (ref 0.1–1.0)
Monocytes Relative: 12 %
Neutro Abs: 2.4 10*3/uL (ref 1.7–7.7)
Neutrophils Relative %: 53 %
Platelets: 271 10*3/uL (ref 150–400)
RBC: 5.19 MIL/uL (ref 4.22–5.81)
RDW: 15.1 % (ref 11.5–15.5)
WBC: 4.4 10*3/uL (ref 4.0–10.5)
nRBC: 0 % (ref 0.0–0.2)

## 2019-08-04 LAB — POCT I-STAT 7, (LYTES, BLD GAS, ICA,H+H)
Acid-Base Excess: 4 mmol/L — ABNORMAL HIGH (ref 0.0–2.0)
Bicarbonate: 31 mmol/L — ABNORMAL HIGH (ref 20.0–28.0)
Calcium, Ion: 1.25 mmol/L (ref 1.15–1.40)
HCT: 43 % (ref 39.0–52.0)
Hemoglobin: 14.6 g/dL (ref 13.0–17.0)
O2 Saturation: 96 %
Potassium: 4 mmol/L (ref 3.5–5.1)
Sodium: 140 mmol/L (ref 135–145)
TCO2: 33 mmol/L — ABNORMAL HIGH (ref 22–32)
pCO2 arterial: 56 mmHg — ABNORMAL HIGH (ref 32.0–48.0)
pH, Arterial: 7.352 (ref 7.350–7.450)
pO2, Arterial: 87 mmHg (ref 83.0–108.0)

## 2019-08-04 LAB — BASIC METABOLIC PANEL
Anion gap: 12 (ref 5–15)
BUN: 17 mg/dL (ref 6–20)
CO2: 31 mmol/L (ref 22–32)
Calcium: 9.1 mg/dL (ref 8.9–10.3)
Chloride: 95 mmol/L — ABNORMAL LOW (ref 98–111)
Creatinine, Ser: 1.72 mg/dL — ABNORMAL HIGH (ref 0.61–1.24)
GFR calc Af Amer: 54 mL/min — ABNORMAL LOW (ref >60)
GFR calc non Af Amer: 46 mL/min — ABNORMAL LOW (ref >60)
Glucose, Bld: 301 mg/dL — ABNORMAL HIGH (ref 70–99)
Potassium: 4.2 mmol/L (ref 3.5–5.1)
Sodium: 138 mmol/L (ref 135–145)

## 2019-08-04 LAB — POCT I-STAT EG7
Acid-Base Excess: 5 mmol/L — ABNORMAL HIGH (ref 0.0–2.0)
Bicarbonate: 31.3 mmol/L — ABNORMAL HIGH (ref 20.0–28.0)
Calcium, Ion: 1.21 mmol/L (ref 1.15–1.40)
HCT: 43 % (ref 39.0–52.0)
Hemoglobin: 14.6 g/dL (ref 13.0–17.0)
O2 Saturation: 60 %
Potassium: 3.9 mmol/L (ref 3.5–5.1)
Sodium: 140 mmol/L (ref 135–145)
TCO2: 33 mmol/L — ABNORMAL HIGH (ref 22–32)
pCO2, Ven: 53.1 mmHg (ref 44.0–60.0)
pH, Ven: 7.379 (ref 7.250–7.430)
pO2, Ven: 33 mmHg (ref 32.0–45.0)

## 2019-08-04 LAB — GLUCOSE, CAPILLARY
Glucose-Capillary: 258 mg/dL — ABNORMAL HIGH (ref 70–99)
Glucose-Capillary: 231 mg/dL — ABNORMAL HIGH (ref 70–99)
Glucose-Capillary: 204 mg/dL — ABNORMAL HIGH (ref 70–99)
Glucose-Capillary: 254 mg/dL — ABNORMAL HIGH (ref 70–99)
Glucose-Capillary: 215 mg/dL — ABNORMAL HIGH (ref 70–99)

## 2019-08-04 LAB — MAGNESIUM: Magnesium: 2.2 mg/dL (ref 1.7–2.4)

## 2019-08-04 SURGERY — RIGHT/LEFT HEART CATH AND CORONARY ANGIOGRAPHY
Anesthesia: LOCAL

## 2019-08-04 MED ORDER — HEPARIN SODIUM (PORCINE) 5000 UNIT/ML IJ SOLN
5000.0000 [IU] | Freq: Three times a day (TID) | INTRAMUSCULAR | Status: DC
  Administered 2019-08-05 – 2019-08-06 (×4): 5000 [IU] via SUBCUTANEOUS
  Filled 2019-08-04 (×4): qty 1

## 2019-08-04 MED ORDER — SODIUM CHLORIDE 0.9 % IV SOLN
INTRAVENOUS | Status: DC
  Administered 2019-08-04: 11:00:00 via INTRAVENOUS

## 2019-08-04 MED ORDER — FENTANYL CITRATE (PF) 100 MCG/2ML IJ SOLN
INTRAMUSCULAR | Status: DC | PRN
  Administered 2019-08-04: 25 ug via INTRAVENOUS

## 2019-08-04 MED ORDER — VERAPAMIL HCL 2.5 MG/ML IV SOLN
INTRAVENOUS | Status: DC | PRN
  Administered 2019-08-04: 10 mL via INTRA_ARTERIAL

## 2019-08-04 MED ORDER — LIDOCAINE HCL (PF) 1 % IJ SOLN
INTRAMUSCULAR | Status: DC | PRN
  Administered 2019-08-04 (×2): 2 mL via INTRADERMAL

## 2019-08-04 MED ORDER — SODIUM CHLORIDE 0.9% FLUSH
3.0000 mL | Freq: Two times a day (BID) | INTRAVENOUS | Status: DC
  Administered 2019-08-04: 3 mL via INTRAVENOUS

## 2019-08-04 MED ORDER — HEPARIN SODIUM (PORCINE) 1000 UNIT/ML IJ SOLN
INTRAMUSCULAR | Status: DC | PRN
  Administered 2019-08-04: 5000 [IU] via INTRAVENOUS

## 2019-08-04 MED ORDER — MIDAZOLAM HCL 2 MG/2ML IJ SOLN
INTRAMUSCULAR | Status: DC | PRN
  Administered 2019-08-04: 1 mg via INTRAVENOUS

## 2019-08-04 MED ORDER — HEPARIN (PORCINE) IN NACL 1000-0.9 UT/500ML-% IV SOLN
INTRAVENOUS | Status: AC
  Filled 2019-08-04: qty 1000

## 2019-08-04 MED ORDER — MIDAZOLAM HCL 2 MG/2ML IJ SOLN
INTRAMUSCULAR | Status: AC
  Filled 2019-08-04: qty 2

## 2019-08-04 MED ORDER — FENTANYL CITRATE (PF) 100 MCG/2ML IJ SOLN
INTRAMUSCULAR | Status: AC
  Filled 2019-08-04: qty 2

## 2019-08-04 MED ORDER — HYDRALAZINE HCL 20 MG/ML IJ SOLN: 10.0000 mg | INTRAMUSCULAR | Status: AC | PRN

## 2019-08-04 MED ORDER — SODIUM CHLORIDE 0.9 % IV SOLN: 250.0000 mL | INTRAVENOUS | Status: DC | PRN

## 2019-08-04 MED ORDER — SODIUM CHLORIDE 0.9% FLUSH: 3.0000 mL | INTRAVENOUS | Status: DC | PRN

## 2019-08-04 MED ORDER — ASPIRIN 81 MG PO CHEW: 81.0000 mg | CHEWABLE_TABLET | ORAL | Status: AC

## 2019-08-04 MED ORDER — HEPARIN (PORCINE) IN NACL 1000-0.9 UT/500ML-% IV SOLN
INTRAVENOUS | Status: DC | PRN
  Administered 2019-08-04 (×2): 500 mL

## 2019-08-04 MED ORDER — IOHEXOL 350 MG/ML SOLN
INTRAVENOUS | Status: DC | PRN
  Administered 2019-08-04: 35 mL via INTRACARDIAC

## 2019-08-04 MED ORDER — VERAPAMIL HCL 2.5 MG/ML IV SOLN
INTRAVENOUS | Status: AC
  Filled 2019-08-04: qty 2

## 2019-08-04 MED ORDER — INSULIN GLARGINE 100 UNIT/ML ~~LOC~~ SOLN
15.0000 [IU] | Freq: Every day | SUBCUTANEOUS | Status: DC
  Administered 2019-08-04 – 2019-08-06 (×3): 15 [IU] via SUBCUTANEOUS
  Filled 2019-08-04 (×3): qty 0.15

## 2019-08-04 MED ORDER — LIDOCAINE HCL (PF) 1 % IJ SOLN
INTRAMUSCULAR | Status: AC
  Filled 2019-08-04: qty 30

## 2019-08-04 MED ORDER — HEPARIN SODIUM (PORCINE) 1000 UNIT/ML IJ SOLN
INTRAMUSCULAR | Status: AC
  Filled 2019-08-04: qty 1

## 2019-08-04 MED ORDER — SODIUM CHLORIDE 0.9% FLUSH
3.0000 mL | Freq: Two times a day (BID) | INTRAVENOUS | Status: DC
  Administered 2019-08-04 – 2019-08-06 (×4): 3 mL via INTRAVENOUS

## 2019-08-04 SURGICAL SUPPLY — 15 items
CATH 5FR JL3.5 JR4 ANG PIG MP (CATHETERS) ×2 IMPLANT
CATH BALLN WEDGE 5F 110CM (CATHETERS) ×2 IMPLANT
DEVICE RAD COMP TR BAND LRG (VASCULAR PRODUCTS) ×2 IMPLANT
GLIDESHEATH SLEND A-KIT 6F 22G (SHEATH) ×2 IMPLANT
GUIDEWIRE INQWIRE 1.5J.035X260 (WIRE) ×1 IMPLANT
HOVERMATT SINGLE USE (MISCELLANEOUS) ×2 IMPLANT
INQWIRE 1.5J .035X260CM (WIRE) ×2
KIT HEART LEFT (KITS) ×2 IMPLANT
KIT MICROPUNCTURE NIT STIFF (SHEATH) ×2 IMPLANT
PACK CARDIAC CATHETERIZATION (CUSTOM PROCEDURE TRAY) ×2 IMPLANT
SHEATH GLIDE SLENDER 4/5FR (SHEATH) ×2 IMPLANT
SHEATH PROBE COVER 6X72 (BAG) ×2 IMPLANT
TRANSDUCER W/STOPCOCK (MISCELLANEOUS) ×2 IMPLANT
TUBING CIL FLEX 10 FLL-RA (TUBING) ×2 IMPLANT
WIRE EMERALD 3MM-J .025X260CM (WIRE) ×2 IMPLANT

## 2019-08-04 NOTE — Progress Notes (Signed)
Kept the pt on NPO for possible cardiac cath. Notified the cardiologist this am, she recomends that pt stays NPO until they make their rounds.

## 2019-08-04 NOTE — Progress Notes (Signed)
TR band removed.

## 2019-08-04 NOTE — Progress Notes (Signed)
Progress Note  Patient Name: Gary Jacobs Date of Encounter: 08/04/2019  Primary Cardiologist: No primary care provider on file.   Subjective   Feels well.    Inpatient Medications    Scheduled Meds: . albuterol  5 mg Nebulization Once  . aspirin  81 mg Oral Pre-Cath  . aspirin EC  81 mg Oral Daily  . atorvastatin  40 mg Oral QHS  . carvedilol  6.25 mg Oral BID WC  . enoxaparin (LOVENOX) injection  80 mg Subcutaneous Q24H  . furosemide  40 mg Intravenous Q12H  . insulin aspart  0-15 Units Subcutaneous TID WC  . insulin aspart  0-5 Units Subcutaneous QHS  . insulin glargine  15 Units Subcutaneous Daily  . sodium chloride flush  3 mL Intravenous Q12H  . spironolactone  12.5 mg Oral Daily   Continuous Infusions: . sodium chloride    . sodium chloride     PRN Meds: sodium chloride, acetaminophen, ondansetron (ZOFRAN) IV, sodium chloride flush   Vital Signs    Vitals:   08/03/19 2025 08/04/19 0508 08/04/19 0830 08/04/19 0946  BP: 118/83 (!) 148/100 102/64 (!) 146/103  Pulse: 100 (!) 103 100 98  Resp: 18 18 18    Temp: 98.4 F (36.9 C) 98.1 F (36.7 C) 98 F (36.7 C)   TempSrc: Oral Oral Oral   SpO2: 96% 97% 100%   Weight:  (!) 159.8 kg    Height:        Intake/Output Summary (Last 24 hours) at 08/04/2019 1020 Last data filed at 08/04/2019 0845 Gross per 24 hour  Intake 223 ml  Output 3725 ml  Net -3502 ml   Last 3 Weights 08/04/2019 08/03/2019 08/02/2019  Weight (lbs) 352 lb 6.4 oz 353 lb 11.2 oz 357 lb  Weight (kg) 159.848 kg 160.437 kg 161.934 kg      Telemetry    NSR - Personally Reviewed  ECG      Physical Exam   GEN: No acute distress.   Neck: No JVD Cardiac: RRR, no murmurs, rubs, or gallops.  Respiratory: Clear to auscultation bilaterally. GI: Soft, nontender, non-distended , obese MS: No edema; No deformity. Neuro:  Nonfocal  Psych: Normal affect   Labs    High Sensitivity Troponin:   Recent Labs  Lab 07/31/19 1247  07/31/19 1534  TROPONINIHS 186* 169*      Chemistry Recent Labs  Lab 08/01/19 0453 08/02/19 0544 08/03/19 0429 08/04/19 0549  NA 136 139 139 138  K 4.7 4.5 4.5 4.2  CL 103 97* 96* 95*  CO2 24 30 31 31   GLUCOSE 338* 237* 210* 301*  BUN 15 16 16 17   CREATININE 1.52* 1.69* 1.61* 1.72*  CALCIUM 9.2 9.6 9.5 9.1  PROT 6.1*  --   --   --   ALBUMIN 3.4*  --   --   --   AST 20  --   --   --   ALT 19  --   --   --   ALKPHOS 92  --   --   --   BILITOT 1.0  --   --   --   GFRNONAA 54* 47* 50* 46*  GFRAA >60 55* 58* 54*  ANIONGAP 9 12 12 12      Hematology Recent Labs  Lab 07/31/19 2326 08/01/19 0453 08/04/19 0549  WBC 7.9 7.5 4.4  RBC 4.90 4.72 5.19  HGB 13.0 12.4* 13.6  HCT 42.0 40.4 43.9  MCV 85.7 85.6 84.6  MCH  26.5 26.3 26.2  MCHC 31.0 30.7 31.0  RDW 15.6* 15.6* 15.1  PLT 243 248 271    BNP Recent Labs  Lab 07/31/19 1535  BNP 549.0*     DDimer No results for input(s): DDIMER in the last 168 hours.   Radiology    No results found.  Cardiac Studies   Decreased EF by echo  Patient Profile     48 y.o. male with new systolic heart failure  Assessment & Plan    1) Plan for left and right heart cath, with minimal dye. No V-gram.  Holding Lasix at this time.  Cardiac catheterization was discussed with the patient fully. The patient understands that risks include but are not limited to stroke (1 in 1000), death (1 in 7), kidney failure [usually temporary] (1 in 500), bleeding (1 in 200), allergic reaction [possibly serious] (1 in 200).  The patient understands and is willing to proceed.    All questions answered.   I suspect he will have some degree of CKD.  Was on lisinopril at home.  May have to reconsider this medication.      For questions or updates, please contact [place] Please consult [email or web address] for contact info under        Signed, Larae Grooms, MD  08/04/2019, 10:20 AM

## 2019-08-04 NOTE — Progress Notes (Signed)
PROGRESS NOTE    Gary Jacobs  XTK:[id number] DOB: 02-Jul-1971 DOA: 07/31/2019 PCP: Caffie Damme, MD   Brief Narrative:  48 y.o.malewith medical history significant fortype 2 diabetes mellitus, hypertension, hyperlipidemia, sleep apnea on CPAP at night and obesity admitted on 07/31/2019 with acute CHF exacerbation-echo showed acute systolic congestive heart failure with ejection fraction of 20 to 25%.  He was started on diuretics.  Cardiology was consulted.  Awaiting LHC and RHC this admission to Rule out ischemic cardiomyopathy and evaluate right-sided pressures  Assessment & Plan:   Principal Problem:   Acute HFrEF (heart failure with reduced ejection fraction) /EF 20 to 25 % Active Problems:   CHF (congestive heart failure) (HCC)   Type 2 diabetes mellitus with hyperlipidemia (HCC)   Essential hypertension   AKI (acute kidney injury) (HCC)   Sleep apnea   Obesity, Class III, BMI 40-49.9 (morbid obesity) (HCC)   Elevated troponin I level   Hyperglycemia   Elevated serum creatinine  Acute systolic congestive heart failure: Echo shows 20 to 25% ejection fraction.  Has had good diuresis so far with [place] balance [place] 99[address] mL.  Lasix is being held today by cardiology since he is going to receive dye due to heart cath..  Continue daily weights, strict I's and O's.  Lower extremity Doppler ruled out DVT.  Cardiology on board.  Plan for left and right heart cath tomorrow.  Type 2 diabetes mellitus: Blood sugar still elevated.  Will increase Lantus to 15 units and continue SSI.  Essential hypertension: Blood pressure controlled.  Continue Coreg, Lasix and Aldactone.  Lisinopril on hold due to AKI.  AKI vs possible CKD, stage unknown: Creatinine on admission 1.62.  It bumped to 1.69 and now to 1. 7 1.  Previous creatinine not known.  Avoiding nephrotoxic agents and hoping that this will improve.  Hyperlipidemia: Lipitor and aspirin.  Morbid obesity with OSA: CPAP at night.   Complicates all care.  DVT prophylaxis: Lovenox Code Status: Full code Family Communication:  None present at bedside.  Plan of care discussed with patient in length and he verbalized understanding and agreed with it. Disposition Plan: Home once cleared by cardiology.  Hopefully tomorrow.  Estimated body mass index is 49.14 kg/m as calculated from the following:   Height as of this encounter: 5\' 11"  (1.803 m).   Weight as of this encounter: 159.8 kg.      Nutritional status:               Consultants:   Cardiology  Procedures:   None  Antimicrobials:   None   Subjective: Seen and examined.  He has no complaints.  Denies any chest pain or shortness of breath.  Objective: Vitals:   08/04/19 0508 08/04/19 0830 08/04/19 0946 08/04/19 1032  BP: (!) 148/100 102/64 (!) 146/103   Pulse: (!) 103 100 98   Resp: 18 18    Temp: 98.1 F (36.7 C) 98 F (36.7 C)    TempSrc: Oral Oral    SpO2: 97% 100%    Weight: (!) 159.8 kg   (!) 159.8 kg  Height:        Intake/Output Summary (Last 24 hours) at 08/04/2019 1247 Last data filed at 08/04/2019 1159 Gross per 24 hour  Intake 220 ml  Output 3225 ml  Net -3005 ml   Filed Weights   08/03/19 0521 08/04/19 0508 08/04/19 1032  Weight: (!) 160.4 kg (!) 159.8 kg (!) 159.8 kg    Examination:  General  exam: Appears calm and comfortable  Respiratory system: Clear to auscultation. Respiratory effort normal. Cardiovascular system: S1 & S2 heard, RRR. No JVD, murmurs, rubs, gallops or clicks.  +1 pitting edema bilateral lower extremity Gastrointestinal system: Abdomen is nondistended, soft and nontender. No organomegaly or masses felt. Normal bowel sounds heard. Central nervous system: Alert and oriented. No focal neurological deficits. Extremities: Symmetric 5 x 5 power. Skin: No rashes, lesions or ulcers.  Psychiatry: Judgement and insight appear normal. Mood & affect appropriate.   Data Reviewed: I have personally  reviewed following labs and imaging studies  CBC: Recent Labs  Lab 07/31/19 1247 07/31/19 2326 08/01/19 0453 08/04/19 0549  WBC 6.5 7.9 7.5 4.4  NEUTROABS  --   --   --  2.4  HGB 12.7* 13.0 12.4* 13.6  HCT 41.3 42.0 40.4 43.9  MCV 87.1 85.7 85.6 84.6  PLT 248 243 248 271   Basic Metabolic Panel: Recent Labs  Lab 07/31/19 1247 07/31/19 2326 08/01/19 0453 08/02/19 0544 08/03/19 0429 08/04/19 0549  NA 137  --  136 139 139 138  K 4.7  --  4.7 4.5 4.5 4.2  CL 102  --  103 97* 96* 95*  CO2 25  --  24 30 31 31   GLUCOSE 282*  --  338* 237* 210* 301*  BUN 15  --  15 16 16 17   CREATININE 1.62* 1.49* 1.52* 1.69* 1.61* 1.72*  CALCIUM 9.1  --  9.2 9.6 9.5 9.1  MG  --   --  2.0  --   --  2.2  PHOS  --   --  3.6  --   --   --    GFR: Estimated Creatinine Clearance: 81.9 mL/min (A) (by C-G formula based on SCr of 1.72 mg/dL (H)). Liver Function Tests: Recent Labs  Lab 08/01/19 0453  AST 20  ALT 19  ALKPHOS 92  BILITOT 1.0  PROT 6.1*  ALBUMIN 3.4*   No results for input(s): LIPASE, AMYLASE in the last 168 hours. No results for input(s): AMMONIA in the last 168 hours. Coagulation Profile: No results for input(s): INR, PROTIME in the last 168 hours. Cardiac Enzymes: No results for input(s): CKTOTAL, CKMB, CKMBINDEX, TROPONINI in the last 168 hours. BNP (last 3 results) No results for input(s): PROBNP in the last 8760 hours. HbA1C: No results for input(s): HGBA1C in the last 72 hours. CBG: Recent Labs  Lab 08/03/19 0628 08/03/19 1150 08/03/19 1614 08/03/19 2122 08/04/19 0643  GLUCAP 207* 333* 289* 269* 258*   Lipid Profile: No results for input(s): CHOL, HDL, LDLCALC, TRIG, CHOLHDL, LDLDIRECT in the last 72 hours. Thyroid Function Tests: No results for input(s): TSH, T4TOTAL, FREET4, T3FREE, THYROIDAB in the last 72 hours. Anemia Panel: No results for input(s): VITAMINB12, FOLATE, FERRITIN, TIBC, IRON, RETICCTPCT in the last 72 hours. Sepsis Labs: No results  for input(s): PROCALCITON, LATICACIDVEN in the last 168 hours.  Recent Results (from the past 240 hour(s))  SARS CORONAVIRUS 2 (TAT 6-24 HRS) Nasopharyngeal Nasopharyngeal Swab     Status: None   Collection Time: 07/31/19  5:53 PM   Specimen: Nasopharyngeal Swab  Result Value Ref Range Status   SARS Coronavirus 2 NEGATIVE NEGATIVE Final    Comment: (NOTE) SARS-CoV-2 target nucleic acids are NOT DETECTED. The SARS-CoV-2 RNA is generally detectable in upper and lower respiratory specimens during the acute phase of infection. Negative results do not preclude SARS-CoV-2 infection, do not rule out co-infections with other pathogens, and should not be used  as the sole basis for treatment or other patient management decisions. Negative results must be combined with clinical observations, patient history, and epidemiological information. The expected result is Negative. Fact Sheet for Patients: SugarRoll.be Fact Sheet for Healthcare Providers: https://www.woods-mathews.com/ This test is not yet approved or cleared by the [place] FDA and  has been authorized for detection and/or diagnosis of SARS-CoV-2 by FDA under an Emergency Use Authorization (EUA). This EUA will remain  in effect (meaning this test can be used) for the duration of the COVID-19 declaration under Section 56 4(b)(1) of the Act, 21 U.S.C. section 360bbb-3(b)(1), unless the authorization is terminated or revoked sooner. Performed at [facility], [place] [address], [place], [place] [address]       Radiology Studies: No results found.  Scheduled Meds: . albuterol  5 mg Nebulization Once  . aspirin EC  81 mg Oral Daily  . atorvastatin  40 mg Oral QHS  . carvedilol  6.25 mg Oral BID WC  . enoxaparin (LOVENOX) injection  80 mg Subcutaneous Q24H  . insulin aspart  0-15 Units Subcutaneous TID WC  . insulin aspart  0-5 Units Subcutaneous QHS  . insulin glargine  15 Units  Subcutaneous Daily  . sodium chloride flush  3 mL Intravenous Q12H  . sodium chloride flush  3 mL Intravenous Q12H  . spironolactone  12.5 mg Oral Daily   Continuous Infusions: . sodium chloride    . sodium chloride    . sodium chloride 50 mL/hr at 08/04/19 1040     LOS: 4 days   Time spent: 27 -minute   Darliss Cheney, MD [facility]  08/04/2019, 12:47 PM   To contact the attending provider between 7A-7P or the covering provider during after hours 7P-7A, please log into the web site [email or web address] and use password [email or web address].

## 2019-08-04 NOTE — Progress Notes (Signed)
Chaplain responded to consult for Advanced Directive.  Chaplain provided paperwork and explained what an AD is to Gary Jacobs.  Chaplain told [name] to have nurse page her when [name] is ready to complete Part C.    Chaplain will follow-up.

## 2019-08-04 NOTE — Interval H&P Note (Signed)
History and Physical Interval Note:  08/04/2019 2:18 PM  Gary Jacobs  has presented today for cardiac catheterization, with the diagnosis of acute systolic heart failure.  The various methods of treatment have been discussed with the patient and family. After consideration of risks, benefits and other options for treatment, the patient has consented to  Procedure(s): RIGHT/LEFT HEART CATH AND CORONARY ANGIOGRAPHY (N/A) as a surgical intervention.  The patient's history has been reviewed, patient examined, no change in status, stable for surgery.  I have reviewed the patient's chart and labs.  Questions were answered to the patient's satisfaction.    Cath Lab Visit (complete for each Cath Lab visit)  Clinical Evaluation Leading to the Procedure:   ACS: No.  Non-ACS:    Anginal/Heart Failure Classification: NYHA IV  Anti-ischemic medical therapy: Minimal Therapy (1 class of medications)  Non-Invasive Test Results: No non-invasive testing performed.  LVEF 20-25% by echo -> high risk  Prior CABG: No previous CABG  Gary Jacobs

## 2019-08-04 NOTE — Progress Notes (Addendum)
Inpatient Diabetes Program Recommendations  AACE/ADA: New Consensus Statement on Inpatient Glycemic Control (2015)  Target Ranges:  Prepandial:   less than 140 mg/dL      Peak postprandial:   less than 180 mg/dL (1-2 hours)      Critically ill patients:  140 - 180 mg/dL   Results for Gary Jacobs, Gary Jacobs (MRN [id number]) as of 08/04/2019 07:22  Ref. Range 08/03/2019 06:28 08/03/2019 11:50 08/03/2019 16:14 08/03/2019 21:22  Glucose-Capillary Latest Ref Range: 70 - 99 mg/dL 207 (H)  5 units NOVOLOG  333 (H)  11 units NOVOLOG +  10 units LANTUS  289 (H)  8 units NOVOLOG  269 (H)   Results for Gary Jacobs, Gary Jacobs (MRN [id number]) as of 08/04/2019 09:28  Ref. Range 08/04/2019 06:43  Glucose-Capillary Latest Ref Range: 70 - 99 mg/dL 258 (H)  8 units NOVOLOG    Results for Gary Jacobs, Gary Jacobs (MRN [id number]) as of 08/04/2019 07:22  Ref. Range 08/01/2019 04:53  Hemoglobin A1C Latest Ref Range: 4.8 - 5.6 % 9.7 (H)  (231 mg/dl)    Admit New Onset CHF  History: DM2   Home DM Meds Trulicity 1.5 mg Qweek + Metformin 500 mg BID + Glipizide ER 10 mg Daily (see PCP notes)   Current Orders: Lantus 10 units Daily      Novolog Moderate Correction Scale/ SSI (0-15 units) TID AC + HS       PCP: Dr. 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 Increase Lantus to 15 units Daily  2. Start Novolog Meal Coverage while home DM meds are on hold (and after pt resumes PO diet today after Cath):  Novolog 6 units TID with meals    Addendum 12:45pm--Met w/ pt today (wife also at bedside).  Discussed Current A1c of 9.7%.  Pt told me he went to see his PCP (Dr. Richardson [facility] with [facility]) last week.  No changes made to home DM  regimen.  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  Takes his meds as scheduled.  Has CBG meter at home.  Pt told me today he is very motivated to make lifestyle changes (exercise and nutrition) to improve both his diabetes and now his heart condition.  Asked several questions about nutrition at home.  Expressed to pt that he can have fruit but must stick with appropriate serving sizes and limit fruit to 1 serving per meal.  Strongly encouraged pt avoid any beverages with carbohydrates/sugar (especially fruit juice, sweet tea, regular soda).  Pt told me he already drinks plain water and plans to stick with that.  I told pt I will attach some nutritional educational info to his chart.  Pt also asking about Cardiac Rehab.  Encouraged pt to check with the Cardiologist and tell the heart MD he is interested in Cardiac Rehab (RN caring for pt also aware of this).  Also gave pt and wife the names of several reputable websites to help with nutrition changes at home.  Encouraged pt to check his CBGs at home per his PCP recommendations ( I advised 1-2 times per day either before or 2 hours after meals.  Pt told me he plans to make serious adjustments to his diet in an  effort to improve his health.    --Will follow patient during hospitalization--  Wyn Quaker RN, MSN, CDE Diabetes Coordinator Inpatient Glycemic Control Team Team Pager: [phone] (8a-5p)

## 2019-08-04 NOTE — H&P (View-Only) (Signed)
Progress Note  Patient Name: Gary Jacobs Date of Encounter: 08/04/2019  Primary Cardiologist: No primary care provider on file.   Subjective   Feels well.    Inpatient Medications    Scheduled Meds: . albuterol  5 mg Nebulization Once  . aspirin  81 mg Oral Pre-Cath  . aspirin EC  81 mg Oral Daily  . atorvastatin  40 mg Oral QHS  . carvedilol  6.25 mg Oral BID WC  . enoxaparin (LOVENOX) injection  80 mg Subcutaneous Q24H  . furosemide  40 mg Intravenous Q12H  . insulin aspart  0-15 Units Subcutaneous TID WC  . insulin aspart  0-5 Units Subcutaneous QHS  . insulin glargine  15 Units Subcutaneous Daily  . sodium chloride flush  3 mL Intravenous Q12H  . spironolactone  12.5 mg Oral Daily   Continuous Infusions: . sodium chloride    . sodium chloride     PRN Meds: sodium chloride, acetaminophen, ondansetron (ZOFRAN) IV, sodium chloride flush   Vital Signs    Vitals:   08/03/19 2025 08/04/19 0508 08/04/19 0830 08/04/19 0946  BP: 118/83 (!) 148/100 102/64 (!) 146/103  Pulse: 100 (!) 103 100 98  Resp: 18 18 18    Temp: 98.4 F (36.9 C) 98.1 F (36.7 C) 98 F (36.7 C)   TempSrc: Oral Oral Oral   SpO2: 96% 97% 100%   Weight:  (!) 159.8 kg    Height:        Intake/Output Summary (Last 24 hours) at 08/04/2019 1020 Last data filed at 08/04/2019 0845 Gross per 24 hour  Intake 223 ml  Output 3725 ml  Net -3502 ml   Last 3 Weights 08/04/2019 08/03/2019 08/02/2019  Weight (lbs) 352 lb 6.4 oz 353 lb 11.2 oz 357 lb  Weight (kg) 159.848 kg 160.437 kg 161.934 kg      Telemetry    NSR - Personally Reviewed  ECG      Physical Exam   GEN: No acute distress.   Neck: No JVD Cardiac: RRR, no murmurs, rubs, or gallops.  Respiratory: Clear to auscultation bilaterally. GI: Soft, nontender, non-distended , obese MS: No edema; No deformity. Neuro:  Nonfocal  Psych: Normal affect   Labs    High Sensitivity Troponin:   Recent Labs  Lab 07/31/19 1247  07/31/19 1534  TROPONINIHS 186* 169*      Chemistry Recent Labs  Lab 08/01/19 0453 08/02/19 0544 08/03/19 0429 08/04/19 0549  NA 136 139 139 138  K 4.7 4.5 4.5 4.2  CL 103 97* 96* 95*  CO2 24 30 31 31   GLUCOSE 338* 237* 210* 301*  BUN 15 16 16 17   CREATININE 1.52* 1.69* 1.61* 1.72*  CALCIUM 9.2 9.6 9.5 9.1  PROT 6.1*  --   --   --   ALBUMIN 3.4*  --   --   --   AST 20  --   --   --   ALT 19  --   --   --   ALKPHOS 92  --   --   --   BILITOT 1.0  --   --   --   GFRNONAA 54* 47* 50* 46*  GFRAA >60 55* 58* 54*  ANIONGAP 9 12 12 12      Hematology Recent Labs  Lab 07/31/19 2326 08/01/19 0453 08/04/19 0549  WBC 7.9 7.5 4.4  RBC 4.90 4.72 5.19  HGB 13.0 12.4* 13.6  HCT 42.0 40.4 43.9  MCV 85.7 85.6 84.6  MCH  26.5 26.3 26.2  MCHC 31.0 30.7 31.0  RDW 15.6* 15.6* 15.1  PLT 243 248 271    BNP Recent Labs  Lab 07/31/19 1535  BNP 549.0*     DDimer No results for input(s): DDIMER in the last 168 hours.   Radiology    No results found.  Cardiac Studies   Decreased EF by echo  Patient Profile     48 y.o. male with new systolic heart failure  Assessment & Plan    1) Plan for left and right heart cath, with minimal dye. No V-gram.  Holding Lasix at this time.  Cardiac catheterization was discussed with the patient fully. The patient understands that risks include but are not limited to stroke (1 in 1000), death (1 in 7), kidney failure [usually temporary] (1 in 500), bleeding (1 in 200), allergic reaction [possibly serious] (1 in 200).  The patient understands and is willing to proceed.    All questions answered.   I suspect he will have some degree of CKD.  Was on lisinopril at home.  May have to reconsider this medication.      For questions or updates, please contact [place] Please consult [email or web address] for contact info under        Signed, Larae Grooms, MD  08/04/2019, 10:20 AM

## 2019-08-04 NOTE — Plan of Care (Signed)
  Problem: Education: Goal: Ability to demonstrate management of disease process will improve Outcome: Adequate for Discharge   Problem: Education: Goal: Ability to verbalize understanding of medication therapies will improve Outcome: Adequate for Discharge   Problem: Activity: Goal: Capacity to carry out activities will improve Outcome: Adequate for Discharge   Problem: Cardiac: Goal: Ability to achieve and maintain adequate cardiopulmonary perfusion will improve Outcome: Adequate for Discharge   

## 2019-08-05 ENCOUNTER — Encounter (HOSPITAL_COMMUNITY): Payer: Self-pay | Admitting: Internal Medicine

## 2019-08-05 LAB — CBC WITH DIFFERENTIAL/PLATELET
Abs Immature Granulocytes: 0.01 10*3/uL (ref 0.00–0.07)
Basophils Absolute: 0 10*3/uL (ref 0.0–0.1)
Basophils Relative: 1 %
Eosinophils Absolute: 0.1 10*3/uL (ref 0.0–0.5)
Eosinophils Relative: 2 %
HCT: 41.1 % (ref 39.0–52.0)
Hemoglobin: 12.9 g/dL — ABNORMAL LOW (ref 13.0–17.0)
Immature Granulocytes: 0 %
Lymphocytes Relative: 34 %
Lymphs Abs: 1.6 10*3/uL (ref 0.7–4.0)
MCH: 26.3 pg (ref 26.0–34.0)
MCHC: 31.4 g/dL (ref 30.0–36.0)
MCV: 83.7 fL (ref 80.0–100.0)
Monocytes Absolute: 0.6 10*3/uL (ref 0.1–1.0)
Monocytes Relative: 12 %
Neutro Abs: 2.4 10*3/uL (ref 1.7–7.7)
Neutrophils Relative %: 51 %
Platelets: 261 10*3/uL (ref 150–400)
RBC: 4.91 MIL/uL (ref 4.22–5.81)
RDW: 14.9 % (ref 11.5–15.5)
WBC: 4.7 10*3/uL (ref 4.0–10.5)
nRBC: 0 % (ref 0.0–0.2)

## 2019-08-05 LAB — GLUCOSE, CAPILLARY
Glucose-Capillary: 208 mg/dL — ABNORMAL HIGH (ref 70–99)
Glucose-Capillary: 246 mg/dL — ABNORMAL HIGH (ref 70–99)
Glucose-Capillary: 221 mg/dL — ABNORMAL HIGH (ref 70–99)
Glucose-Capillary: 242 mg/dL — ABNORMAL HIGH (ref 70–99)

## 2019-08-05 LAB — BASIC METABOLIC PANEL
Anion gap: 13 (ref 5–15)
BUN: 15 mg/dL (ref 6–20)
CO2: 27 mmol/L (ref 22–32)
Calcium: 9.1 mg/dL (ref 8.9–10.3)
Chloride: 98 mmol/L (ref 98–111)
Creatinine, Ser: 1.59 mg/dL — ABNORMAL HIGH (ref 0.61–1.24)
GFR calc Af Amer: 59 mL/min — ABNORMAL LOW (ref >60)
GFR calc non Af Amer: 51 mL/min — ABNORMAL LOW (ref >60)
Glucose, Bld: 209 mg/dL — ABNORMAL HIGH (ref 70–99)
Potassium: 4.4 mmol/L (ref 3.5–5.1)
Sodium: 138 mmol/L (ref 135–145)

## 2019-08-05 MED ORDER — SACUBITRIL-VALSARTAN 24-26 MG PO TABS
1.0000 | ORAL_TABLET | Freq: Two times a day (BID) | ORAL | Status: DC
  Administered 2019-08-05 – 2019-08-06 (×3): 1 via ORAL
  Filled 2019-08-05 (×3): qty 1

## 2019-08-05 MED ORDER — FUROSEMIDE 10 MG/ML IJ SOLN
40.0000 mg | Freq: Once | INTRAMUSCULAR | Status: DC
  Filled 2019-08-05: qty 4

## 2019-08-05 MED ORDER — FUROSEMIDE 10 MG/ML IJ SOLN
40.0000 mg | Freq: Two times a day (BID) | INTRAMUSCULAR | Status: DC
  Administered 2019-08-05 – 2019-08-06 (×3): 40 mg via INTRAVENOUS
  Filled 2019-08-05 (×2): qty 4

## 2019-08-05 NOTE — Progress Notes (Signed)
PROGRESS NOTE    Gary Jacobs  ZOX:[id number] DOB: May 29, 1971 DOA: 07/31/2019 PCP: Glendon Axe, MD   Brief Narrative:  48 y.o.malewith medical history significant fortype 2 diabetes mellitus, hypertension, hyperlipidemia, sleep apnea on CPAP at night and obesity admitted on 07/31/2019 with acute CHF exacerbation-echo showed acute systolic congestive heart failure with ejection fraction of 20 to 25%.  He was started on diuretics.  Cardiology was consulted.  Patient underwent cardiac cath which showed nonobstructive CAD, not requiring any stent.  Cardiology recommended medical management.  He is going to be resumed back on IV Lasix and started on Entresto.  Assessment & Plan:   Principal Problem:   Acute HFrEF (heart failure with reduced ejection fraction) /EF 20 to 25 % Active Problems:   CHF (congestive heart failure) (HCC)   Type 2 diabetes mellitus with hyperlipidemia (HCC)   Essential hypertension   AKI (acute kidney injury) ([place])   Sleep apnea   Obesity, Class III, BMI 40-49.9 (morbid obesity) (HCC)   Elevated troponin I level   Hyperglycemia   Elevated serum creatinine  Acute systolic congestive heart failure: Echo shows 20 to 25% ejection fraction.  Has had good diuresis so far with total negative balance of 12453ml but a positive balance of 65 mL since yesterday.  His Lasix was held yesterday since he was going to receive contrast due to heart cath.  Catheterization showed nonobstructive CAD.  Cardiology recommended medical management.  He has been resumed back on Lasix IV 40 mg twice daily and he is also started on Entresto for the first time.  Due to elevated creatinine and blood pressure being soft, cardiology recommends monitoring overnight to see if he tolerates diuretics and Entresto. Lower extremity Doppler ruled out DVT.  Cardiology on board.   Type 2 diabetes mellitus: Blood sugar still elevated but better than yesterday.  We will continue current dose of Lantus  which is 15 units along with SSI.   Essential hypertension: Blood pressure on the soft side.  Continue Coreg, Lasix, Aldactone.  Lisinopril on hold due to AKI.   AKI vs possible CKD, stage unknown: Creatinine on admission 1.62.  It bumped to 1.69 and to 1.71 0.59.  Previous creatinine not known.  Avoiding nephrotoxic agents and hoping that this will improve.  He may get worse now that he is back on Lasix and Entresto started.  Watch for renal function and repeat labs tomorrow.  Hyperlipidemia: Lipitor and aspirin.  Morbid obesity with OSA: CPAP at night.  Complicates all care.  DVT prophylaxis: Lovenox Code Status: Full code Family Communication:  None present at bedside.  Plan of care discussed with patient in length and he verbalized understanding and agreed with it. Disposition Plan: Cardiology has resumed his Lasix and has also started him on Entresto.  Due to his soft blood pressure and elevated creatinine, they recommend watching overnight to see if he tolerates Entresto.  Estimated body mass index is 48.7 kg/m as calculated from the following:   Height as of this encounter: 5\' 11"  (1.803 m).   Weight as of this encounter: 158.4 kg.      Nutritional status:               Consultants:   Cardiology  Procedures:   None  Antimicrobials:   None   Subjective: Seen and examined.  Feels better.  No shortness of breath.  No chest pain.  Objective: Vitals:   08/05/19 0639 08/05/19 0733 08/05/19 0957 08/05/19 1001  BP:  119/60  107/78 113/83  Pulse:  96 99 99  Resp:  20    Temp:  98.4 F (36.9 C)    TempSrc:  Oral    SpO2:  100%    Weight: (!) 158.4 kg     Height:        Intake/Output Summary (Last 24 hours) at 08/05/2019 1003 Last data filed at 08/05/2019 0955 Gross per 24 hour  Intake 1469.2 ml  Output 1475 ml  Net -5.8 ml   Filed Weights   08/04/19 0508 08/04/19 1032 08/05/19 0639  Weight: (!) 159.8 kg (!) 159.8 kg (!) 158.4 kg    Examination:   General exam: Appears calm and comfortable  Respiratory system: Clear to auscultation. Respiratory effort normal. Cardiovascular system: S1 & S2 heard, RRR. No JVD, murmurs, rubs, gallops or clicks.  +1 pitting edema bilateral lower extremity Gastrointestinal system: Abdomen is nondistended, soft and nontender. No organomegaly or masses felt. Normal bowel sounds heard. Central nervous system: Alert and oriented. No focal neurological deficits. Extremities: Symmetric 5 x 5 power. Skin: No rashes, lesions or ulcers.  Psychiatry: Judgement and insight appear normal. Mood & affect appropriate.   Data Reviewed: I have personally reviewed following labs and imaging studies  CBC: Recent Labs  Lab 07/31/19 1247 07/31/19 2326 08/01/19 0453 08/04/19 0549 08/04/19 1440 08/04/19 1443 08/05/19 0347  WBC 6.5 7.9 7.5 4.4  --   --  4.7  NEUTROABS  --   --   --  2.4  --   --  2.4  HGB 12.7* 13.0 12.4* 13.6 14.6 14.6 12.9*  HCT 41.3 42.0 40.4 43.9 43.0 43.0 41.1  MCV 87.1 85.7 85.6 84.6  --   --  83.7  PLT 248 243 248 271  --   --  261   Basic Metabolic Panel: Recent Labs  Lab 08/01/19 0453 08/02/19 0544 08/03/19 0429 08/04/19 0549 08/04/19 1440 08/04/19 1443 08/05/19 0347  NA 136 139 139 138 140 140 138  K 4.7 4.5 4.5 4.2 3.9 4.0 4.4  CL 103 97* 96* 95*  --   --  98  CO2 24 30 31 31   --   --  27  GLUCOSE 338* 237* 210* 301*  --   --  209*  BUN 15 16 16 17   --   --  15  CREATININE 1.52* 1.69* 1.61* 1.72*  --   --  1.59*  CALCIUM 9.2 9.6 9.5 9.1  --   --  9.1  MG 2.0  --   --  2.2  --   --   --   PHOS 3.6  --   --   --   --   --   --    GFR: Estimated Creatinine Clearance: 88.1 mL/min (A) (by C-G formula based on SCr of 1.59 mg/dL (H)). Liver Function Tests: Recent Labs  Lab 08/01/19 0453  AST 20  ALT 19  ALKPHOS 92  BILITOT 1.0  PROT 6.1*  ALBUMIN 3.4*   No results for input(s): LIPASE, AMYLASE in the last 168 hours. No results for input(s): AMMONIA in the last 168  hours. Coagulation Profile: No results for input(s): INR, PROTIME in the last 168 hours. Cardiac Enzymes: No results for input(s): CKTOTAL, CKMB, CKMBINDEX, TROPONINI in the last 168 hours. BNP (last 3 results) No results for input(s): PROBNP in the last 8760 hours. HbA1C: No results for input(s): HGBA1C in the last 72 hours. CBG: Recent Labs  Lab 08/04/19 0952 08/04/19 1240 08/04/19 1655 08/04/19  2049 08/05/19 0628  GLUCAP 231* 204* 254* 215* 208*   Lipid Profile: No results for input(s): CHOL, HDL, LDLCALC, TRIG, CHOLHDL, LDLDIRECT in the last 72 hours. Thyroid Function Tests: No results for input(s): TSH, T4TOTAL, FREET4, T3FREE, THYROIDAB in the last 72 hours. Anemia Panel: No results for input(s): VITAMINB12, FOLATE, FERRITIN, TIBC, IRON, RETICCTPCT in the last 72 hours. Sepsis Labs: No results for input(s): PROCALCITON, LATICACIDVEN in the last 168 hours.  Recent Results (from the past 240 hour(s))  SARS CORONAVIRUS 2 (TAT 6-24 HRS) Nasopharyngeal Nasopharyngeal Swab     Status: None   Collection Time: 07/31/19  5:53 PM   Specimen: Nasopharyngeal Swab  Result Value Ref Range Status   SARS Coronavirus 2 NEGATIVE NEGATIVE Final    Comment: (NOTE) SARS-CoV-2 target nucleic acids are NOT DETECTED. The SARS-CoV-2 RNA is generally detectable in upper and lower respiratory specimens during the acute phase of infection. Negative results do not preclude SARS-CoV-2 infection, do not rule out co-infections with other pathogens, and should not be used as the sole basis for treatment or other patient management decisions. Negative results must be combined with clinical observations, patient history, and epidemiological information. The expected result is Negative. Fact Sheet for Patients: HairSlick.no Fact Sheet for Healthcare Providers: quierodirigir.com This test is not yet approved or cleared by the [place] FDA and   has been authorized for detection and/or diagnosis of SARS-CoV-2 by FDA under an Emergency Use Authorization (EUA). This EUA will remain  in effect (meaning this test can be used) for the duration of the COVID-19 declaration under Section 56 4(b)(1) of the Act, 21 U.S.C. section 360bbb-3(b)(1), unless the authorization is terminated or revoked sooner. Performed at [facility], [address], [place], [place] [address]       Radiology Studies: No results found.  Scheduled Meds: . albuterol  5 mg Nebulization Once  . aspirin EC  81 mg Oral Daily  . atorvastatin  40 mg Oral QHS  . carvedilol  6.25 mg Oral BID WC  . furosemide  40 mg Intravenous BID  . heparin  5,000 Units Subcutaneous Q8H  . insulin aspart  0-15 Units Subcutaneous TID WC  . insulin aspart  0-5 Units Subcutaneous QHS  . insulin glargine  15 Units Subcutaneous Daily  . sacubitril-valsartan  1 tablet Oral BID  . sodium chloride flush  3 mL Intravenous Q12H  . sodium chloride flush  3 mL Intravenous Q12H  . sodium chloride flush  3 mL Intravenous Q12H  . spironolactone  12.5 mg Oral Daily   Continuous Infusions: . sodium chloride    . sodium chloride       LOS: 5 days   Time spent: 28 -minute   Hughie Closs, MD [facility]  08/05/2019, 10:03 AM   To contact the attending provider between 7A-7P or the covering provider during after hours 7P-7A, please log into the web site [email or web address] and use password [email or web address].

## 2019-08-05 NOTE — Progress Notes (Addendum)
Progress Note  Patient Name: Gary Jacobs Date of Encounter: 08/05/2019  Primary Cardiologist: No primary care provider on file. f/u [place]  Subjective   Breathing fine, no chest pain, relieved to find out no significant CAD Wife is on it, she has cleaned out the cabinets and is looking at low-sodium options such as Mrs. Deliah Boston He has been reading the information given him and notes that he used to drink a gallon of water a day and is supposed to cut back to 64 ounces.  Inpatient Medications    Scheduled Meds: . albuterol  5 mg Nebulization Once  . aspirin EC  81 mg Oral Daily  . atorvastatin  40 mg Oral QHS  . carvedilol  6.25 mg Oral BID WC  . heparin  5,000 Units Subcutaneous Q8H  . insulin aspart  0-15 Units Subcutaneous TID WC  . insulin aspart  0-5 Units Subcutaneous QHS  . insulin glargine  15 Units Subcutaneous Daily  . sodium chloride flush  3 mL Intravenous Q12H  . sodium chloride flush  3 mL Intravenous Q12H  . sodium chloride flush  3 mL Intravenous Q12H  . spironolactone  12.5 mg Oral Daily   Continuous Infusions: . sodium chloride    . sodium chloride     PRN Meds: sodium chloride, sodium chloride, acetaminophen, ondansetron (ZOFRAN) IV, sodium chloride flush, sodium chloride flush   Vital Signs    Vitals:   08/05/19 0519 08/05/19 0629 08/05/19 0639 08/05/19 0733  BP: 105/66 91/61  119/60  Pulse: 89 88  96  Resp: 17   20  Temp: 98.5 F (36.9 C)   98.4 F (36.9 C)  TempSrc: Oral   Oral  SpO2: 99%   100%  Weight:   (!) 158.4 kg   Height:        Intake/Output Summary (Last 24 hours) at 08/05/2019 0831 Last data filed at 08/05/2019 8676 Gross per 24 hour  Intake 1469.2 ml  Output 1500 ml  Net -30.8 ml   Last 3 Weights 08/05/2019 08/04/2019 08/04/2019  Weight (lbs) 349 lb 3.2 oz 352 lb 4.7 oz 352 lb 6.4 oz  Weight (kg) 158.396 kg 159.8 kg 159.848 kg      Telemetry    SR - Personally Reviewed  ECG    None today  Physical Exam   General: Well developed, well nourished, male in no acute distress Head: Eyes PERRLA, Head normocephalic and atraumatic Lungs: Few scattered rails bilaterally to auscultation. Heart: HRRR S1 S2, without rub or gallop. No murmur. 4/4 extremity pulses are 2+ & equal. No JVD seen, difficult to assess secondary to body habitus. Abdomen: Bowel sounds are present, abdomen soft and non-tender without masses or  hernias noted. Msk: Normal strength and tone for age. Extremities: No clubbing, cyanosis or edema.  Right radial cath site without ecchymosis or hematoma Skin:  No rashes or lesions noted. Neuro: Alert and oriented X 3. Psych:  Good affect, responds appropriately   Labs    High Sensitivity Troponin:   Recent Labs  Lab 07/31/19 1247 07/31/19 1534  TROPONINIHS 186* 169*      Chemistry Recent Labs  Lab 08/01/19 0453  08/03/19 0429 08/04/19 0549 08/04/19 1440 08/04/19 1443 08/05/19 0347  NA 136   < > 139 138 140 140 138  K 4.7   < > 4.5 4.2 3.9 4.0 4.4  CL 103   < > 96* 95*  --   --  98  CO2 24   < >  31 31  --   --  27  GLUCOSE 338*   < > 210* 301*  --   --  209*  BUN 15   < > 16 17  --   --  15  CREATININE 1.52*   < > 1.61* 1.72*  --   --  1.59*  CALCIUM 9.2   < > 9.5 9.1  --   --  9.1  PROT 6.1*  --   --   --   --   --   --   ALBUMIN 3.4*  --   --   --   --   --   --   AST 20  --   --   --   --   --   --   ALT 19  --   --   --   --   --   --   ALKPHOS 92  --   --   --   --   --   --   BILITOT 1.0  --   --   --   --   --   --   GFRNONAA 54*   < > 50* 46*  --   --  51*  GFRAA >60   < > 58* 54*  --   --  59*  ANIONGAP 9   < > 12 12  --   --  13   < > = values in this interval not displayed.     Hematology Recent Labs  Lab 08/01/19 0453 08/04/19 0549 08/04/19 1440 08/04/19 1443 08/05/19 0347  WBC 7.5 4.4  --   --  4.7  RBC 4.72 5.19  --   --  4.91  HGB 12.4* 13.6 14.6 14.6 12.9*  HCT 40.4 43.9 43.0 43.0 41.1  MCV 85.6 84.6  --   --  83.7  MCH 26.3 26.2  --    --  26.3  MCHC 30.7 31.0  --   --  31.4  RDW 15.6* 15.1  --   --  14.9  PLT 248 271  --   --  261    BNP Recent Labs  Lab 07/31/19 1535  BNP 549.0*       Radiology    No results found.  Cardiac Studies   CARDIAC CATH: 08/04/2019 Conclusions: 1. Mild to moderate, nonobstructive coronary artery disease with 30-40% proximal/mid LAD stenosis.  Otherwise, there is no angiographically significant coronary artery disease. 2. Moderately elevated left heart and pulmonary artery pressures. 3. Mildly to moderately elevated right heart filling pressures. 4. Mildly reduced Fick cardiac output/index.  Recommendations: 1. Medical therapy and risk factor modification to prevent progression of coronary artery disease. 2. Avoid post catheterization hydration.  If renal function tolerates, diuresis should be restarted tomorrow. 3. Optimize evidence-based heart failure therapy for treatment of nonischemic cardiomyopathy.  ECHO: 08/01/2019  1. Left ventricular ejection fraction, by visual estimation, is 20 to 25%. The left ventricle has severely decreased function. There is no left ventricular hypertrophy.  2. Indeterminate diastolic filling due to E-A fusion.  3. Moderately dilated left ventricular internal cavity size.  4. The left ventricle demonstrates global hypokinesis.  5. Global right ventricle has low normal systolic function.The right ventricular size is normal. No increase in right ventricular wall thickness.  6. Left atrial size was severely dilated.  7. Right atrial size was moderately dilated.  8. The mitral valve is normal in structure. Mild to moderate mitral valve  regurgitation.  9. The tricuspid valve is normal in structure. Tricuspid valve regurgitation mild-moderate. 10. The aortic valve is normal in structure. Aortic valve regurgitation is trivial. 11. The pulmonic valve was normal in structure. Pulmonic valve regurgitation is not visualized. 12. Mildly elevated  pulmonary artery systolic pressure.  Patient Profile     48 y.o. male with new systolic heart failure  Assessment & Plan    1) Acute systolic CHF:  - No sig CAD at cath>>NICM w/ EF 25% -Renal function stable post cath, restart Lasix at 40 mg IV twice daily -Tolerating Coreg 6.25 mg twice daily -Spironolactone 12.5 mg daily held yesterday for cath, restart today -blood pressure limits additional medical therapy, systolic BP 90s at times, discussed this with the patient and explained that his blood pressure will be on the low side in order to get heart failure meds on board  2.  Acute renal insufficiency, possible CKD -His renal function is stable post-cath  Otherwise, per IM Principal Problem:   Acute HFrEF (heart failure with reduced ejection fraction) /EF 20 to 25 % Active Problems:   CHF (congestive heart failure) (HCC)   Type 2 diabetes mellitus with hyperlipidemia (HCC)   Essential hypertension   AKI (acute kidney injury) (HCC)   Sleep apnea   Obesity, Class III, BMI 40-49.9 (morbid obesity) (HCC)   Elevated troponin I level   Hyperglycemia   Elevated serum creatinine   For questions or updates, please contact [facility] Please consult [email or web address] for contact info under        Signed, Theodore Demark, PA-C  08/05/2019, 8:31 AM    I have examined the patient and reviewed assessment and plan and discussed with patient.  Agree with above as stated.  Right wrist stable.  More diuresis.  Start low dose Entresto in addition to spironolactone and Coreg.  WIll need to send records to his PMD at [facility]. If BP will tolerate meds, he will be stable for discharge, likely tomorrow.  BMet in AM.  Nutritional changes needed.  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  Now startig treatment for OSA.  Weight loss will be beneficial.   Lance Muss

## 2019-08-05 NOTE — Progress Notes (Signed)
This chaplain completed a referral for F/U on Pt. AD. The Pt. stated he did not have any questions about an Advance Directive.  The chaplain shared the Pt. options for notarizing an AD inside and outside the hospital.  The Pt. chose to wait to complete his AD.  The chaplain offered F/U spiritual care if needed.

## 2019-08-05 NOTE — Plan of Care (Signed)
  Problem: Education: Goal: Ability to demonstrate management of disease process will improve Outcome: Adequate for Discharge   Problem: Education: Goal: Ability to verbalize understanding of medication therapies will improve Outcome: Adequate for Discharge   

## 2019-08-05 NOTE — Progress Notes (Signed)
Inpatient Diabetes Program Recommendations  AACE/ADA: New Consensus Statement on Inpatient Glycemic Control (2015)  Target Ranges:  Prepandial:   less than 140 mg/dL      Peak postprandial:   less than 180 mg/dL (1-2 hours)      Critically ill patients:  140 - 180 mg/dL   Results for Gary Jacobs, Gary Jacobs (MRN [id number]) as of 08/05/2019 10:20  Ref. Range 08/04/2019 06:43 08/04/2019 09:52 08/04/2019 12:40 08/04/2019 16:55 08/04/2019 20:49  Glucose-Capillary Latest Ref Range: 70 - 99 mg/dL 258 (H)  8 units NOVOLOG  231 (H)    15 units LANTUS 204 (H)  5 units NOVOLOG  254 (H)  8 units NOVOLOG  215 (H)  2 units NOVOLOG    Results for Gary Jacobs, Gary Jacobs (MRN [id number]) as of 08/05/2019 10:20  Ref. Range 08/05/2019 06:28  Glucose-Capillary Latest Ref Range: 70 - 99 mg/dL 208 (H)  5 units NOVOLOG      Home DM Meds: Trulicity 1.5 mg Qweek       Metformin 500 mg BID       Glipizide ER 10 mg Daily (see PCP notes)   Current Orders: Lantus 15 units Daily                            Novolog Moderate Correction Scale/ SSI (0-15 units) TID AC + HS       PCP: Dr. 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

## 2019-08-05 NOTE — TOC Progression Note (Signed)
Transition of Care [facility]) - Progression Note    Patient Details  Name: Gary Jacobs MRN: [id number] Date of Birth: [birth year]  [facility] CM/SW Contact  Zenon Mayo, RN Phone Number: 08/05/2019, 10:16 AM  Clinical Narrative:    NCM spoke with patient, offered choice for [facility] for CHF, he states he does not want [place] services.  NCM left list with patient.  He states he does not have any issues with getting medications and he will have transportation at dc.   TOC team will follow for dc needs.        Expected Discharge Plan and Services                                                 Social Determinants of Health (SDOH) Interventions    Readmission Risk Interventions No flowsheet data found.

## 2019-08-06 ENCOUNTER — Encounter (HOSPITAL_COMMUNITY): Payer: Self-pay | Admitting: General Practice

## 2019-08-06 LAB — CBC WITH DIFFERENTIAL/PLATELET
Abs Immature Granulocytes: 0.01 10*3/uL (ref 0.00–0.07)
Basophils Absolute: 0 10*3/uL (ref 0.0–0.1)
Basophils Relative: 1 %
Eosinophils Absolute: 0.1 10*3/uL (ref 0.0–0.5)
Eosinophils Relative: 2 %
HCT: 47.2 % (ref 39.0–52.0)
Hemoglobin: 14.8 g/dL (ref 13.0–17.0)
Immature Granulocytes: 0 %
Lymphocytes Relative: 41 %
Lymphs Abs: 1.9 10*3/uL (ref 0.7–4.0)
MCH: 26.5 pg (ref 26.0–34.0)
MCHC: 31.4 g/dL (ref 30.0–36.0)
MCV: 84.4 fL (ref 80.0–100.0)
Monocytes Absolute: 0.6 10*3/uL (ref 0.1–1.0)
Monocytes Relative: 13 %
Neutro Abs: 2 10*3/uL (ref 1.7–7.7)
Neutrophils Relative %: 43 %
Platelets: 309 10*3/uL (ref 150–400)
RBC: 5.59 MIL/uL (ref 4.22–5.81)
RDW: 14.7 % (ref 11.5–15.5)
WBC: 4.7 10*3/uL (ref 4.0–10.5)
nRBC: 0 % (ref 0.0–0.2)

## 2019-08-06 LAB — BASIC METABOLIC PANEL
Anion gap: 9 (ref 5–15)
BUN: 15 mg/dL (ref 6–20)
CO2: 29 mmol/L (ref 22–32)
Calcium: 9.3 mg/dL (ref 8.9–10.3)
Chloride: 99 mmol/L (ref 98–111)
Creatinine, Ser: 1.55 mg/dL — ABNORMAL HIGH (ref 0.61–1.24)
GFR calc Af Amer: >60 mL/min (ref >60)
GFR calc non Af Amer: 53 mL/min — ABNORMAL LOW (ref >60)
Glucose, Bld: 248 mg/dL — ABNORMAL HIGH (ref 70–99)
Potassium: 4.4 mmol/L (ref 3.5–5.1)
Sodium: 137 mmol/L (ref 135–145)

## 2019-08-06 LAB — GLUCOSE, CAPILLARY
Glucose-Capillary: 228 mg/dL — ABNORMAL HIGH (ref 70–99)
Glucose-Capillary: 317 mg/dL — ABNORMAL HIGH (ref 70–99)

## 2019-08-06 MED ORDER — FUROSEMIDE 40 MG PO TABS: 40.0000 mg | ORAL_TABLET | Freq: Every day | ORAL | Status: DC

## 2019-08-06 MED ORDER — CARVEDILOL 6.25 MG PO TABS: 6.2500 mg | ORAL_TABLET | Freq: Two times a day (BID) | ORAL | 0 refills | Status: DC

## 2019-08-06 MED ORDER — SACUBITRIL-VALSARTAN 24-26 MG PO TABS: 1.0000 | ORAL_TABLET | Freq: Two times a day (BID) | ORAL | 0 refills | Status: DC

## 2019-08-06 MED ORDER — SPIRONOLACTONE 25 MG PO TABS: 12.5000 mg | ORAL_TABLET | Freq: Every day | ORAL | 0 refills | Status: DC

## 2019-08-06 MED ORDER — ASPIRIN 81 MG PO TBEC: 81.0000 mg | DELAYED_RELEASE_TABLET | Freq: Every day | ORAL | 0 refills | Status: AC

## 2019-08-06 MED ORDER — FUROSEMIDE 40 MG PO TABS: 40.0000 mg | ORAL_TABLET | Freq: Every day | ORAL | 0 refills | Status: DC

## 2019-08-06 MED FILL — ENTRESTO 24 MG-26 MG TABLET: 24-26 | 30 days supply | Qty: 60 | Fill #0

## 2019-08-06 MED FILL — ASPIRIN 81MG ADULT LOW STRE: 81 | 30 days supply | Qty: 30 | Fill #0

## 2019-08-06 MED FILL — CARVEDILOL 6.25 MG TABLET: 6.25 | 30 days supply | Qty: 60 | Fill #0

## 2019-08-06 MED FILL — FUROSEMIDE 40 MG TABLET: 40 | 30 days supply | Qty: 30 | Fill #0

## 2019-08-06 MED FILL — SPIRONOLACTONE 25 MG TABLET: 25 | 30 days supply | Qty: 15 | Fill #0

## 2019-08-06 NOTE — Progress Notes (Signed)
Patient has been provided discharge instructions. AVS included right radial site care, managing heart failure, and counting carbohydrates for diabetes treatment. Pt understands medications and has received them from the transition pharmacy. Pt had no questions outstanding. Patient belongings consist of cell phone, headphones, and clothes, all of which are accounted for. No belongings or medications sent to safe or pharmacy, respectively. IV removed with no complications.

## 2019-08-06 NOTE — Progress Notes (Addendum)
Progress Note  Patient Name: Gary Jacobs Date of Encounter: 08/06/2019  Primary Cardiologist: No primary care provider on file. to see cardiology at [facility] clinic  Subjective   Up walking in room.  No chest pain and no SOB.    Inpatient Medications    Scheduled Meds: . albuterol  5 mg Nebulization Once  . aspirin EC  81 mg Oral Daily  . atorvastatin  40 mg Oral QHS  . carvedilol  6.25 mg Oral BID WC  . furosemide  40 mg Intravenous BID  . heparin  5,000 Units Subcutaneous Q8H  . insulin aspart  0-15 Units Subcutaneous TID WC  . insulin aspart  0-5 Units Subcutaneous QHS  . insulin glargine  15 Units Subcutaneous Daily  . sacubitril-valsartan  1 tablet Oral BID  . sodium chloride flush  3 mL Intravenous Q12H  . sodium chloride flush  3 mL Intravenous Q12H  . sodium chloride flush  3 mL Intravenous Q12H  . spironolactone  12.5 mg Oral Daily   Continuous Infusions: . sodium chloride    . sodium chloride     PRN Meds: sodium chloride, sodium chloride, acetaminophen, ondansetron (ZOFRAN) IV, sodium chloride flush, sodium chloride flush   Vital Signs    Vitals:   08/05/19 2117 08/05/19 2325 08/06/19 0056 08/06/19 0556  BP: 93/64  90/62 (!) 92/56  Pulse: 94 96 84 97  Resp: 20 18 20 20   Temp: 98.1 F (36.7 C)  97.8 F (36.6 C) 97.9 F (36.6 C)  TempSrc: Oral  Oral Oral  SpO2: 100% 97%  99%  Weight:    (!) 156.9 kg  Height:        Intake/Output Summary (Last 24 hours) at 08/06/2019 0701 Last data filed at 08/06/2019 1610 Gross per 24 hour  Intake 1062 ml  Output 2900 ml  Net -1838 ml   Last 3 Weights 08/06/2019 08/05/2019 08/04/2019  Weight (lbs) 345 lb 14.4 oz 349 lb 3.2 oz 352 lb 4.7 oz  Weight (kg) 156.899 kg 158.396 kg 159.8 kg      Telemetry    SR - Personally Reviewed  ECG    No new - Personally Reviewed  Physical Exam   GEN: No acute distress.   Neck: No JVD Cardiac: RRR, no murmurs, rubs, or gallops.  Respiratory: Clear to  auscultation bilaterally. GI: Soft, nontender, non-distended  MS: No edema; No deformity. Neuro:  Nonfocal  Psych: Normal affect   Labs    High Sensitivity Troponin:   Recent Labs  Lab 07/31/19 1247 07/31/19 1534  TROPONINIHS 186* 169*      Chemistry Recent Labs  Lab 08/01/19 0453  08/03/19 0429 08/04/19 0549 08/04/19 1440 08/04/19 1443 08/05/19 0347  NA 136   < > 139 138 140 140 138  K 4.7   < > 4.5 4.2 3.9 4.0 4.4  CL 103   < > 96* 95*  --   --  98  CO2 24   < > 31 31  --   --  27  GLUCOSE 338*   < > 210* 301*  --   --  209*  BUN 15   < > 16 17  --   --  15  CREATININE 1.52*   < > 1.61* 1.72*  --   --  1.59*  CALCIUM 9.2   < > 9.5 9.1  --   --  9.1  PROT 6.1*  --   --   --   --   --   --  ALBUMIN 3.4*  --   --   --   --   --   --   AST 20  --   --   --   --   --   --   ALT 19  --   --   --   --   --   --   ALKPHOS 92  --   --   --   --   --   --   BILITOT 1.0  --   --   --   --   --   --   GFRNONAA 54*   < > 50* 46*  --   --  51*  GFRAA >60   < > 58* 54*  --   --  59*  ANIONGAP 9   < > 12 12  --   --  13   < > = values in this interval not displayed.     Hematology Recent Labs  Lab 08/04/19 0549  08/04/19 1443 08/05/19 0347 08/06/19 0454  WBC 4.4  --   --  4.7 4.7  RBC 5.19  --   --  4.91 5.59  HGB 13.6   < > 14.6 12.9* 14.8  HCT 43.9   < > 43.0 41.1 47.2  MCV 84.6  --   --  83.7 84.4  MCH 26.2  --   --  26.3 26.5  MCHC 31.0  --   --  31.4 31.4  RDW 15.1  --   --  14.9 14.7  PLT 271  --   --  261 309   < > = values in this interval not displayed.    BNP Recent Labs  Lab 07/31/19 1535  BNP 549.0*     DDimer No results for input(s): DDIMER in the last 168 hours.   Radiology    No results found.  Cardiac Studies   Cardiac cath 08/04/19 Conclusions: 1. Mild to moderate, nonobstructive coronary artery disease with 30-40% proximal/mid LAD stenosis.  Otherwise, there is no angiographically significant coronary artery disease. 2. Moderately  elevated left heart and pulmonary artery pressures. 3. Mildly to moderately elevated right heart filling pressures. 4. Mildly reduced Fick cardiac output/index.  Recommendations: 1. Medical therapy and risk factor modification to prevent progression of coronary artery disease. 2. Avoid post catheterization hydration.  If renal function tolerates, diuresis should be restarted tomorrow. 3. Optimize evidence-based heart failure therapy for treatment of nonischemic cardiomyopathy.  Yvonne Kendall, MD  Echo 08/01/19 IMPRESSIONS    1. Left ventricular ejection fraction, by visual estimation, is 20 to 25%. The left ventricle has severely decreased function. There is no left ventricular hypertrophy.  2. Indeterminate diastolic filling due to E-A fusion.  3. Moderately dilated left ventricular internal cavity size.  4. The left ventricle demonstrates global hypokinesis.  5. Global right ventricle has low normal systolic function.The right ventricular size is normal. No increase in right ventricular wall thickness.  6. Left atrial size was severely dilated.  7. Right atrial size was moderately dilated.  8. The mitral valve is normal in structure. Mild to moderate mitral valve regurgitation.  9. The tricuspid valve is normal in structure. Tricuspid valve regurgitation mild-moderate. 10. The aortic valve is normal in structure. Aortic valve regurgitation is trivial. 11. The pulmonic valve was normal in structure. Pulmonic valve regurgitation is not visualized. 12. Mildly elevated pulmonary artery systolic pressure.  FINDINGS  Left Ventricle: Left ventricular ejection fraction, by visual estimation, is  20 to 25%. The left ventricle has severely decreased function. The left ventricle demonstrates global hypokinesis. The left ventricular internal cavity size was moderately  dilated left ventricle. There is no left ventricular hypertrophy. Indeterminate diastolic filling due to E-A fusion.   Right Ventricle: The right ventricular size is normal. No increase in right ventricular wall thickness. Global RV systolic function is has low normal systolic function. The tricuspid regurgitant velocity is 2.63 m/s, and with an assumed right atrial  pressure of 3 mmHg, the estimated right ventricular systolic pressure is mildly elevated at 30.6 mmHg.  Left Atrium: Left atrial size was severely dilated.  Right Atrium: Right atrial size was moderately dilated  Pericardium: There is no evidence of pericardial effusion.  Mitral Valve: The mitral valve is normal in structure. Mild to moderate mitral valve regurgitation.  Tricuspid Valve: The tricuspid valve is normal in structure. Tricuspid valve regurgitation mild-moderate.  Aortic Valve: The aortic valve is normal in structure. Aortic valve regurgitation is trivial.  Pulmonic Valve: The pulmonic valve was normal in structure. Pulmonic valve regurgitation is not visualized.  Aorta: The aortic root and ascending aorta are structurally normal, with no evidence of dilitation.  IAS/Shunts: No atrial level shunt detected by color flow Doppler.       Patient Profile     48 y.o. male with hx DM-2, HTN, HLD, OSA on CPAP now with acute systolic CHF and EF 20-25%   Assessment & Plan    Acute systolic HF with NICM EF 20-25%  --on BB, spironolactone and lasix 40 mg IV BID discharge on po per Dr. Eldridge Dace  . Sherryll Burger has been started.  Needs follow up in 7-10 days  --14,325 since admit and wt down from 165.9 Kg to 156.9 Kg (19.8 LBs)  --BMP K+ 4.4, BUN 15 Cr 1.55    --low salt diet   AKI results today pending   CAD in native vessel on ASA and statin along with BB  HTN controlled  Will follow up with [facility] with PCP and cardiology there.          For questions or updates, please contact [facility] Please consult [email or web address] for contact info under        Signed, Nada Boozer, NP  08/06/2019, 7:01 AM     I have examined the patient and reviewed assessment and plan and discussed with patient.  Agree with above as stated.    We spoke about low-salt diet.  Entresto tolerated.  We will get case manager to help figure out cost.  He would like to follow-up in [facility] office.  He will need follow-up lab work.  He currently follows with [facility] medical.    Okay to discharge from a cardiac standpoint. Continue treatment for sleep apnea.  Continue blood pressure control.  Renal function stable after cath and Entresto.  This will need to be followed long-term.  Lance Muss

## 2019-08-06 NOTE — Discharge Instructions (Signed)
Heart Failure, Self Care Heart failure is a serious condition. This document explains the things you need to do to take care of yourself after a heart failure diagnosis. You may be asked to change your diet, take certain medicines, and make other lifestyle changes in order to stay as healthy as possible. Your health care provider may also give you more specific instructions. If you have problems or questions, contact your health care provider. What are the risks? Having heart failure puts you at higher risk for certain problems. These problems can get worse if you do not take good care of yourself. Problems may include:  Blood clotting problems. This may cause a stroke.  Damage to the kidneys, liver, or lungs.  Abnormal heart rhythms. Supplies needed:  Scale for monitoring weight.  Blood pressure monitor.  Notebook.  Medicines. How to care for yourself when you have heart failure Medicines Take over-the-counter and prescription medicines only as told by your health care provider. Medicines reduce the workload of your heart, slow the progression of heart failure, and improve symptoms. Take your medicines every day.  Do not stop taking your medicine unless your health care provider tells you to do so.  Do not skip any dose of medicine.  Refill your prescriptions before you run out of medicine. Eating and drinking   Eat heart-healthy foods. Talk with a dietitian to make an eating plan that is right for you. ? Choose foods that contain no trans fat and are low in saturated fat and cholesterol. Healthy choices include fresh or frozen fruits and vegetables, fish, lean meats, legumes, fat-free or low-fat dairy products, and whole-grain or high-fiber foods. ? Limit salt (sodium) if told by your health care provider. Sodium restriction may reduce symptoms of heart failure. Ask a dietitian to recommend heart-healthy seasonings. ? Use healthy cooking methods instead of frying. Healthy  methods include roasting, grilling, broiling, baking, poaching, steaming, and stir-frying.  Limit your fluid intake, if directed by your health care provider. Fluid restriction may reduce symptoms of heart failure. Alcohol use  Do not drink alcohol if: ? Your health care provider tells you not to drink. ? Your heart was damaged by alcohol, or you have severe heart failure. ? You are pregnant, may be pregnant, or are planning to become pregnant.  If you drink alcohol: ? Limit how much you use to:  0-1 drink a day for women.  0-2 drinks a day for men. ? Be aware of how much alcohol is in your drink. In the [place] one drink equals one 12 oz bottle of beer (355 mL), one 5 oz glass of wine (148 mL), or one 1 oz glass of hard liquor (44 mL). Lifestyle   Do not use any products that contain nicotine or tobacco, such as cigarettes, e-cigarettes, and chewing tobacco. If you need help quitting, ask your health care provider. ? Do not use nicotine gum or patches before talking to your health care provider.  Do not use illegal drugs.  Work with your health care provider to safely reach the right body weight.  Do physical activity if told by your health care provider. Talk to your health care provider before you begin an exercise if: ? You are an older adult. ? You have severe heart failure.  Learn to manage stress. If you need help to do this, ask your health care provider.  Participate in or seek rehabilitation as needed to keep or improve your independence and quality of life.  Plan rest periods when you get tired. Monitoring important information   Weigh yourself every day. This will help you to notice if too much fluid is building up in your body. ? Weigh yourself every morning after you urinate and before you eat breakfast. ? Wear the same amount of clothing each time you weigh yourself. ? Record your daily weight. Provide your health care provider with your weight  record.  Monitor and record your pulse and blood pressure as told by your health care provider. Dealing with extreme temperatures  If the weather is extremely hot: ? Avoid vigorous physical activity. ? Use air conditioning or fans, or find a cooler location. ? Avoid caffeine and alcohol. ? Wear loose-fitting, lightweight, and light-colored clothing.  If the weather is extremely cold: ? Avoid vigorous activity. ? Layer your clothes. ? Wear mittens or gloves, a hat, and a scarf when you go outside. ? Avoid alcohol. Follow these instructions at home:  Stay up to date with vaccines. Pneumococcal and flu (influenza) vaccines are especially important in preventing infections of the airways.  Keep all follow-up visits as told by your health care provider. This is important. Contact a health care provider if you:  Have a rapid weight gain.  Have increasing shortness of breath.  Are unable to participate in your usual physical activities.  Get tired easily.  Cough more than normal, especially with physical activity.  Lose your appetite or feel nauseous.  Have any swelling or more swelling in areas such as your hands, feet, ankles, or abdomen.  Are unable to sleep because it is hard to breathe.  Feel like your heart is beating quickly (palpitations).  Become dizzy or light-headed when you stand up. Get help right away if you:  Have trouble breathing.  Notice or your family notices a change in your awareness, such as having trouble staying awake or concentrating.  Have pain or discomfort in your chest.  Have an episode of fainting (syncope). These symptoms may represent a serious problem that is an emergency. Do not wait to see if the symptoms will go away. Get medical help right away. Call your local emergency services (911 in the [place] Do not drive yourself to the hospital. Summary  Heart failure is a serious condition. To care for yourself, you may be asked to change  your diet, take certain medicines, and make other lifestyle changes.  Take your medicines every day. Do not stop taking them unless your health care provider tells you to do so.  Eat heart-healthy foods, such as fresh or frozen fruits and vegetables, fish, lean meats, legumes, fat-free or low-fat dairy products, and whole-grain or high-fiber foods.  Ask your health care provider if you have any alcohol restrictions. You may have to stop drinking alcohol if you have severe heart failure.  Contact your health care provider if you notice problems, such as rapid weight gain or a fast heartbeat. Get help right away if you faint, or have chest pain or trouble breathing. This information is not intended to replace advice given to you by your health care provider. Make sure you discuss any questions you have with your health care provider. Document Released: 12/18/2018 Document Revised: 12/17/2018 Document Reviewed: 12/18/2018 Elsevier Patient Education  2020 [employer]. Call [facility] at [phone] if any bleeding, swelling or drainage at cath site.  May shower, no tub baths for 48 hours for groin sticks. No lifting over 5 pounds for 3 days.  No  Driving for 3 days   Weigh daily and call your MD for wt gain of 3 lbs in a day or 5 lbs in a week.   Low salt diet and heart healthy.

## 2019-08-06 NOTE — Progress Notes (Signed)
CARDIAC REHAB PHASE I   PRE:  Rate/Rhythm: 96 SR  BP:  Supine:   Sitting: 108/81  Standing:    SaO2: 98%RA  MODE:  Ambulation: 950 ft   POST:  Rate/Rhythm: 108 ST  BP:  Supine:   Sitting: 124/93  Standing:    SaO2: 99%RA 1030-1122 Pt stated has been walking about 15 minutes in hallway and tolerating well. Walked 950 ft on RA with steady gait and tolerated well. Pt has been reading CHF booklet. Reviewed importance of daily weights, signs/.symptons of when to call MD, 2000 mg sodium restriction and 2 L FR. Pt stated he has scales at home. Gave low sodium diets and diabetic diet. Discussed walking for ex and guidelines given. Discussed CRP 2 and will refer to [facility] for CHF. 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 Pt voiced understanding of ed.   Graylon Good, RN BSN  08/06/2019 11:18 AM

## 2019-08-06 NOTE — TOC Transition Note (Signed)
Transition of Care [facility]) - CM/SW Discharge Note   Patient Details  Name: Gary Jacobs MRN: [id number] Date of Birth: 1971/05/23  [facility] CM/SW Contact:  Zenon Mayo, RN Phone Number: 08/06/2019, 12:06 PM   Clinical Narrative:    NCM spoke with patient, offered choice for [facility] for CHF, he states he does not want [place] services.  NCM left list with patient.  He states he does not have any issues with getting medications and he will have transportation at dc.   [place] is filling medications for patient he will get the 30 day free entresto, NCM gave him the 10.00 co pay card for eliquis and also awaiting benefit check.  NCM informed patient that if his deductible is not met he will not be able to use the 10.00 co pay card until the deductible is met.  He states he understands.     Final next level of care: Home/Self Care Barriers to Discharge: No Barriers Identified   Patient Goals and CMS Choice Patient states their goals for this hospitalization and ongoing recovery are:: go home CMS Medicare.gov Compare Post Acute Care list provided to:: Patient Choice offered to / list presented to : Patient  Discharge Placement                       Discharge Plan and Services                DME Arranged: (NA)         HH Arranged: Refused HH          Social Determinants of Health (SDOH) Interventions     Readmission Risk Interventions No flowsheet data found.

## 2019-08-06 NOTE — Discharge Summary (Signed)
Physician Discharge Summary  Gary Jacobs ZOX:[id number] DOB: 12/27/1970 DOA: 07/31/2019  PCP: Caffie DammeSmith, Karla, MD  Admit date: 07/31/2019 Discharge date: 08/06/2019  Admitted From: Home Disposition:  Home  Recommendations for Outpatient Follow-up:  1. Follow up with PCP in 1-2 weeks 2. Follow up with Cardiology as scheduled  Discharge Condition:Stable CODE STATUS:Full Diet recommendation: Diabetic, heart healthy   Brief/Interim Summary: 48 y.o.malewith medical history significant fortype 2 diabetes mellitus, hypertension, hyperlipidemia, sleep apnea on CPAP at night and obesityadmitted on 07/31/2019 with acute CHF exacerbation-echo showed acute systolic congestive heart failure with ejection fraction of 20 to 25%.  He was started on diuretics.  Cardiology was consulted.  Patient underwent cardiac cath which showed nonobstructive CAD, not requiring any stent.  Cardiology recommended medical management.  He is going to be resumed back on IV Lasix and started on Entresto.  Discharge Diagnoses:  Principal Problem:   Acute HFrEF (heart failure with reduced ejection fraction) /EF 20 to 25 % Active Problems:   CHF (congestive heart failure) (HCC)   Type 2 diabetes mellitus with hyperlipidemia (HCC)   Essential hypertension   AKI (acute kidney injury) (HCC)   Sleep apnea   Obesity, Class III, BMI 40-49.9 (morbid obesity) (HCC)   Elevated troponin I level   Hyperglycemia   Elevated serum creatinine  Acute systolic congestive heart failure:  -Echo shows 20 to 25% ejection fraction.   -Diuresed well this admit -Pt underwent heart cath which demonstrated nonobstructive CAD. -Cardiology recommended medical management.   -Pt is continued on scheduled lasix and started on Entresto   -LE dopplers without evidence of DVT -Cleared for d/c per Cardiology  Type 2 diabetes mellitus:  -Blood sugar remained in the 200's.   -Pt was continued on Lantus and SSI while in hospital -Would  resume home meds on d/c  Essential hypertension:  -Blood pressure on the soft side.   -Continue Coreg, Lasix, Aldactone. -Started entresto  AKI vs possible CKD, stage unknown:  -Creatinine on admission 1.62.  It bumped to 1.69 and to 1.71 0.59.   -Previous creatinine not known.   -Cr remained stable on entresto  Hyperlipidemia:  -Lipitor and aspirin.  Morbid obesity with OSA: -Continue CPAP at night.  Complicates all care.   Discharge Instructions  Discharge Instructions    Amb Referral to Cardiac Rehabilitation   Complete by: As directed    Diagnosis: Heart Failure (see criteria below if ordering Phase II)   Heart Failure Type: Chronic Systolic   After initial evaluation and assessments completed: Virtual Based Care may be provided alone or in conjunction with Phase 2 Cardiac Rehab based on patient barriers.: Yes     Allergies as of 08/06/2019   No Known Allergies     Medication List    STOP taking these medications   lisinopril 10 MG tablet Commonly known as: ZESTRIL     TAKE these medications   aspirin 81 MG EC tablet Take 1 tablet (81 mg total) by mouth daily. Start taking on: August 07, 2019   atorvastatin 40 MG tablet Commonly known as: LIPITOR Take 40 mg by mouth at bedtime.   carvedilol 6.25 MG tablet Commonly known as: COREG Take 1 tablet (6.25 mg total) by mouth 2 (two) times daily with a meal.   furosemide 40 MG tablet Commonly known as: LASIX Take 1 tablet (40 mg total) by mouth daily. Start taking on: August 07, 2019   metFORMIN 500 MG tablet Commonly known as: GLUCOPHAGE Take 500 mg by mouth 2 (  two) times daily with a meal.   multivitamin with minerals Tabs tablet Take 1 tablet by mouth daily.   sacubitril-valsartan 24-26 MG Commonly known as: ENTRESTO Take 1 tablet by mouth 2 (two) times daily.   spironolactone 25 MG tablet Commonly known as: ALDACTONE Take 0.5 tablets (12.5 mg total) by mouth daily. Start taking on:  August 07, 2019   Trulicity 1.5 MG/0.5ML Sopn Generic drug: Dulaglutide Inject 1.5 mg into the skin once a week.   Vitamin D (Ergocalciferol) 1.25 MG (50000 UT) Caps capsule Commonly known as: DRISDOL Take 50,000 Units by mouth every 7 (seven) days.      Follow-up Information    Corky Crafts, MD Follow up.   Specialties: Cardiology, Radiology, Interventional Cardiology Why: this is cardiologist you saw at [facility].  if you have issues before being seen by your Primary MD please call.  Contact information: [address] [place] [address] [phone]        Caffie Damme, MD. Schedule an appointment as soon as possible for a visit in 2 week(s).   Specialty: Family Medicine Contact information: [address] [place] [address] [phone]          No Known Allergies  Consultations:  Cardiology  Procedures/Studies: Dg Chest 2 View  Result Date: 07/31/2019 CLINICAL DATA:  Shortness of breath. EXAM: CHEST - 2 VIEW COMPARISON:  . FINDINGS: Cardiomegaly. Mild pulmonary venous congestion bilateral interstitial prominence noted. CHF could present in this fashion. Right upper lobe alveolar infiltrate. This could represent focal pulmonary edema and/or pneumonia. No pleural effusion or pneumothorax. Degenerative change thoracic spine. IMPRESSION: 1. Cardiomegaly with pulmonary venous congestion and bilateral interstitial prominence suggesting CHF. 2. Right upper lobe alveolar infiltrate noted. This could represent focal edema and or pneumonia. Electronically Signed   By: Maisie Fus  Register   On: 07/31/2019 13:00   Ct Angio Chest Pe W/cm &/or Wo Cm  Result Date: 07/31/2019 CLINICAL DATA:  PE suspected, high pretest prob. Shortness of breath. EXAM: CT ANGIOGRAPHY CHEST WITH CONTRAST TECHNIQUE: Multidetector CT imaging of the chest was performed using the standard protocol during bolus administration of intravenous contrast. Multiplanar CT  image reconstructions and MIPs were obtained to evaluate the vascular anatomy. CONTRAST:  OMNIPAQUE IOHEXOL 350 MG/ML SOLN COMPARISON:  Radiograph earlier this day. FINDINGS: Cardiovascular: There are no filling defects within the central pulmonary arteries to suggest pulmonary embolus. Evaluation is diagnostic to the proximal segmental level. Distal segmental and subsegmental branches cannot be assessed due to contrast bolus timing and soft tissue attenuation from habitus. Mild multi chamber cardiomegaly. Minimal contrast refluxes into the IVC and hepatic veins. Thoracic aorta is normal in caliber. Cannot assess for dissection given phase of contrast tailored to pulmonary artery evaluation. Mediastinum/Nodes: Calcified subcarinal lymph nodes. No noncalcified adenopathy. Heterogeneous enlargement of the thyroid gland calcifications on the left extending substernal. No dominant nodule, however evaluation is technically limited. Esophagus is decompressed. Lungs/Pleura: Nodular of bilateral ground-glass opacities throughout both lungs, slightly more prominent on the right. Findings slightly more prominent in the upper lobes. Mild smooth septal thickening. Trachea and mainstem bronchi are patent. No solid pulmonary mass. Small right pleural effusion. Upper Abdomen: Minimal contrast refluxing into the hepatic veins and IVC. No other acute finding. Musculoskeletal: There are no acute or suspicious osseous abnormalities. Review of the MIP images confirms the above findings. IMPRESSION: 1. No central pulmonary embolus to the proximal segmental level. Distal segmental and subsegmental branches cannot be assessed due to  contrast bolus timing and soft tissue attenuation from habitus. 2. Nodular bilateral ground-glass opacities throughout both lungs, slightly more prominent in the upper lobes. Findings may be due to atypical infection (including but not classic for COVID-19) or pulmonary edema. In the setting of septal  thickening, cardiomegaly, and small right pleural effusion, pulmonary edema is favored. 3. Mild multi chamber cardiomegaly. Minimal contrast refluxing into the IVC and hepatic veins, suggesting elevated right heart pressures. 4. Heterogeneously enlarged thyroid gland. Left thyroid calcification. No dominant nodule is seen, however evaluation is technically limited due to soft tissue attenuation from habitus. Consider further evaluation with thyroid ultrasound on a nonemergent basis. Electronically Signed   By: Keith Rake M.D.   On: 07/31/2019 18:11   Vas [place] Lower Extremity Venous (dvt)  Result Date: 08/01/2019  Lower Venous Study Indications: Swelling.  Risk Factors: None identified. Limitations: Body habitus and poor ultrasound/tissue interface. Comparison Study: 11/23/2018 - Performing Technologist: Oliver Hum RVT  Examination Guidelines: A complete evaluation includes B-mode imaging, spectral Doppler, color Doppler, and power Doppler as needed of all accessible portions of each vessel. Bilateral testing is considered an integral part of a complete examination. Limited examinations for reoccurring indications may be performed as noted.  +---------+---------------+---------+-----------+----------+--------------+ RIGHT    CompressibilityPhasicitySpontaneityPropertiesThrombus Aging +---------+---------------+---------+-----------+----------+--------------+ CFV      Full           Yes      Yes                                 +---------+---------------+---------+-----------+----------+--------------+ SFJ      Full                                                        +---------+---------------+---------+-----------+----------+--------------+ FV Prox  Full                                                        +---------+---------------+---------+-----------+----------+--------------+ FV Mid   Full                                                         +---------+---------------+---------+-----------+----------+--------------+ FV DistalFull           Yes      Yes                                 +---------+---------------+---------+-----------+----------+--------------+ PFV      Full                                                        +---------+---------------+---------+-----------+----------+--------------+ POP      Full           Yes      Yes                                 +---------+---------------+---------+-----------+----------+--------------+  PTV      Full                                                        +---------+---------------+---------+-----------+----------+--------------+ PERO     Full                                                        +---------+---------------+---------+-----------+----------+--------------+   +----+---------------+---------+-----------+----------+--------------+ LEFTCompressibilityPhasicitySpontaneityPropertiesThrombus Aging +----+---------------+---------+-----------+----------+--------------+ CFV Full           Yes      Yes                                 +----+---------------+---------+-----------+----------+--------------+     Summary: Right: There is no evidence of deep vein thrombosis in the lower extremity. However, portions of this examination were limited- see technologist comments above. No cystic structure found in the popliteal fossa. Left: No evidence of common femoral vein obstruction.  *See table(s) above for measurements and observations. Electronically signed by Sherald Hess MD on 08/01/2019 at 3:19:23 PM.    Final      Subjective: Eager to go home  Discharge Exam: Vitals:   08/06/19 0556 08/06/19 0728  BP: (!) 92/56 113/80  Pulse: 97 99  Resp: 20   Temp: 97.9 F (36.6 C) 97.6 F (36.4 C)  SpO2: 99% 100%   Vitals:   08/05/19 2325 08/06/19 0056 08/06/19 0556 08/06/19 0728  BP:  90/62 (!) 92/56 113/80  Pulse: 96 84 97 99  Resp:  Temp:  97.8 F (36.6 C) 97.9 F (36.6 C) 97.6 F (36.4 C)  TempSrc:  Oral Oral Oral  SpO2: 97%  99% 100%  Weight:   (!) 156.9 kg   Height:        General: Pt is alert, awake, not in acute distress Cardiovascular: RRR, S1/S2 +, no rubs, no gallops Respiratory: CTA bilaterally, no wheezing, no rhonchi Abdominal: Soft, NT, ND, bowel sounds + Extremities: no edema, no cyanosis   The results of significant diagnostics from this hospitalization (including imaging, microbiology, ancillary and laboratory) are listed below for reference.     Microbiology: Recent Results (from the past 240 hour(s))  SARS CORONAVIRUS 2 (TAT 6-24 HRS) Nasopharyngeal Nasopharyngeal Swab     Status: None   Collection Time: 07/31/19  5:53 PM   Specimen: Nasopharyngeal Swab  Result Value Ref Range Status   SARS Coronavirus 2 NEGATIVE NEGATIVE Final    Comment: (NOTE) SARS-CoV-2 target nucleic acids are NOT DETECTED. The SARS-CoV-2 RNA is generally detectable in upper and lower respiratory specimens during the acute phase of infection. Negative results do not preclude SARS-CoV-2 infection, do not rule out co-infections with other pathogens, and should not be used as the sole basis for treatment or other patient management decisions. Negative results must be combined with clinical observations, patient history, and epidemiological information. The expected result is Negative. Fact Sheet for Patients: HairSlick.no Fact Sheet for Healthcare Providers: quierodirigir.com This test is not yet approved or cleared by the [place] FDA and  has been authorized for detection and/or diagnosis of  SARS-CoV-2 by FDA under an Emergency Use Authorization (EUA). This EUA will remain  in effect (meaning this test can be used) for the duration of the COVID-19 declaration under Section 56 4(b)(1) of the Act, 21 U.S.C. section 360bbb-3(b)(1), unless the  authorization is terminated or revoked sooner. Performed at [facility], [address] [place], [place] [address]      Labs: BNP (last 3 results) Recent Labs    07/31/19 1535  BNP 549.0*   Basic Metabolic Panel: Recent Labs  Lab 08/01/19 0453 08/02/19 0544 08/03/19 0429 08/04/19 0549 08/04/19 1440 08/04/19 1443 08/05/19 0347 08/06/19 0454  NA 136 139 139 138 140 140 138 137  K 4.7 4.5 4.5 4.2 3.9 4.0 4.4 4.4  CL 103 97* 96* 95*  --   --  98 99  CO2 24 30 31 31   --   --  27 29  GLUCOSE 338* 237* 210* 301*  --   --  209* 248*  BUN 15 16 16 17   --   --  15 15  CREATININE 1.52* 1.69* 1.61* 1.72*  --   --  1.59* 1.55*  CALCIUM 9.2 9.6 9.5 9.1  --   --  9.1 9.3  MG 2.0  --   --  2.2  --   --   --   --   PHOS 3.6  --   --   --   --   --   --   --    Liver Function Tests: Recent Labs  Lab 08/01/19 0453  AST 20  ALT 19  ALKPHOS 92  BILITOT 1.0  PROT 6.1*  ALBUMIN 3.4*   No results for input(s): LIPASE, AMYLASE in the last 168 hours. No results for input(s): AMMONIA in the last 168 hours. CBC: Recent Labs  Lab 07/31/19 2326 08/01/19 0453 08/04/19 0549 08/04/19 1440 08/04/19 1443 08/05/19 0347 08/06/19 0454  WBC 7.9 7.5 4.4  --   --  4.7 4.7  NEUTROABS  --   --  2.4  --   --  2.4 2.0  HGB 13.0 12.4* 13.6 14.6 14.6 12.9* 14.8  HCT 42.0 40.4 43.9 43.0 43.0 41.1 47.2  MCV 85.7 85.6 84.6  --   --  83.7 84.4  PLT 243 248 271  --   --  261 309   Cardiac Enzymes: No results for input(s): CKTOTAL, CKMB, CKMBINDEX, TROPONINI in the last 168 hours. BNP: Invalid input(s): POCBNP CBG: Recent Labs  Lab 08/05/19 1116 08/05/19 1637 08/05/19 2120 08/06/19 0600 08/06/19 1116  GLUCAP 246* 221* 242* 228* 317*   D-Dimer No results for input(s): DDIMER in the last 72 hours. Hgb A1c No results for input(s): HGBA1C in the last 72 hours. Lipid Profile No results for input(s): CHOL, HDL, LDLCALC, TRIG, CHOLHDL, LDLDIRECT in the last 72 hours. Thyroid  function studies No results for input(s): TSH, T4TOTAL, T3FREE, THYROIDAB in the last 72 hours.  Invalid input(s): FREET3 Anemia work up No results for input(s): VITAMINB12, FOLATE, FERRITIN, TIBC, IRON, RETICCTPCT in the last 72 hours. Urinalysis No results found for: COLORURINE, APPEARANCEUR, LABSPEC, PHURINE, GLUCOSEU, HGBUR, BILIRUBINUR, KETONESUR, PROTEINUR, UROBILINOGEN, NITRITE, LEUKOCYTESUR Sepsis Labs Invalid input(s): PROCALCITONIN,  WBC,  LACTICIDVEN Microbiology Recent Results (from the past 240 hour(s))  SARS CORONAVIRUS 2 (TAT 6-24 HRS) Nasopharyngeal Nasopharyngeal Swab     Status: None   Collection Time: 07/31/19  5:53 PM   Specimen: Nasopharyngeal Swab  Result Value Ref Range Status   SARS Coronavirus 2  NEGATIVE NEGATIVE Final    Comment: (NOTE) SARS-CoV-2 target nucleic acids are NOT DETECTED. The SARS-CoV-2 RNA is generally detectable in upper and lower respiratory specimens during the acute phase of infection. Negative results do not preclude SARS-CoV-2 infection, do not rule out co-infections with other pathogens, and should not be used as the sole basis for treatment or other patient management decisions. Negative results must be combined with clinical observations, patient history, and epidemiological information. The expected result is Negative. Fact Sheet for Patients: HairSlick.nohttps://www.fda.gov/media/138098/download Fact Sheet for Healthcare Providers: quierodirigir.comhttps://www.fda.gov/media/138095/download This test is not yet approved or cleared by the [place] FDA and  has been authorized for detection and/or diagnosis of SARS-CoV-2 by FDA under an Emergency Use Authorization (EUA). This EUA will remain  in effect (meaning this test can be used) for the duration of the COVID-19 declaration under Section 56 4(b)(1) of the Act, 21 U.S.C. section 360bbb-3(b)(1), unless the authorization is terminated or revoked sooner. Performed at [facility], [address] [place], [place] [address]    Time spent: 30 min  SIGNED:   Rickey BarbaraStephen Slyvester Latona, MD  [facility] 08/06/2019, 11:24 AM  If 7PM-7AM, please contact night-coverage

## 2019-08-11 NOTE — Progress Notes (Signed)
Cardiology Office Note:    Date:  08/12/2019   ID:  Gary Jacobs, DOB 12-03-1970, MRN [id number]  PCP:  No primary care provider on file.  Cardiologist:  Lance Muss, MD / Tereso Newcomer, PA-C  (Patient prefers to follow up in [place] at [facility]) Electrophysiologist:  None   Referring MD: Caffie Damme, MD   Chief Complaint  Patient presents with  . Hospitalization Follow-up    Admx with CHF    History of Present Illness:    Gary Jacobs is a 48 y.o. male with:   Chronic systolic CHF  Nonischemic cardiomyopathy  Echo 07/2019: EF 20-25  Coronary artery disease   Nonobstructive by cardiac catheterization 07/2019  Diabetes mellitus   Hypertension   Hyperlipidemia   OSA   Gary Jacobs was admitted 11/12-11/18 with new onset acute systolic heart failure.  His high-sensitivity troponin levels were mildly elevated without a consistent trend, consistent with demand ischemia.  Diuresis was complicated by acute kidney injury.  Creatinine improved prior to discharge.  Right left heart catheterization demonstrated mild to moderate nonobstructive CAD with 30-40% LAD stenosis.  He was started on therapy for nonischemic cardiomyopathy (Carvediolol, Spironolactone, Entresto).    He returns for follow-up.  He is here alone.  Since discharge, he has been doing well.  He has not had significant shortness of breath.  He has not had chest discomfort, syncope, orthopnea, leg swelling.  He has had some itchiness and some redness on his chest where the telemetry leads were placed.  He has not had any difficulty swallowing, tongue or lip swelling.     Prior CV studies:   The following studies were reviewed today:  R/L Cardiac catheterization 08/04/2019 LM normal LAD prox 35 LCx normal  RCA normal RA (mean): 10 mmHg RV (S/EDP): 60/12 mmHg PA (S/D, mean): 60/30 (40) mmHg PCWP (mean): 25-30 mmHg LVEDP 30  Ao sat: 96% PA sat: 60%  Fick CO: 5.4 L/min Fick CI: 2.0  L/min/m^2  Right LE venous duplex 08/01/2019 Summary: Right: There is no evidence of deep vein thrombosis in the lower extremity. However, portions of this examination were limited- see technologist comments above. No cystic structure found in the popliteal fossa. Left: No evidence of common femoral vein obstruction.  Echocardiogram 08/01/2019 EF 20-25, low normal RV SF, severe LAE, moderate RAE, mild to moderate MR, mild to moderate TR, trivial AI, RVSP 30.6  Past Medical History:  Diagnosis Date  . CHF (congestive heart failure) (HCC)   . Diabetes mellitus without complication (HCC)   . Dyslipidemia   . Hypertension   . Sleep apnea    Surgical Hx: The patient  has a past surgical history that includes RIGHT/LEFT HEART CATH AND CORONARY ANGIOGRAPHY (N/A, 08/04/2019).   Current Medications: Current Meds  Medication Sig  . aspirin EC 81 MG EC tablet Take 1 tablet (81 mg total) by mouth daily.  Marland Kitchen atorvastatin (LIPITOR) 40 MG tablet Take 40 mg by mouth at bedtime.  . Dulaglutide (TRULICITY) 1.5 MG/0.5ML SOPN Inject 1.5 mg into the skin once a week.  . furosemide (LASIX) 40 MG tablet Take 1 tablet (40 mg total) by mouth daily.  Marland Kitchen glipiZIDE (GLUCOTROL XL) 10 MG 24 hr tablet Take 10 mg by mouth daily with breakfast.  . metFORMIN (GLUCOPHAGE) 500 MG tablet Take 500 mg by mouth 2 (two) times daily with a meal.  . Multiple Vitamin (MULTIVITAMIN WITH MINERALS) TABS tablet Take 1 tablet by mouth daily.  . sacubitril-valsartan (ENTRESTO) 24-26 MG  Take 1 tablet by mouth 2 (two) times daily.  Marland Kitchen spironolactone (ALDACTONE) 25 MG tablet Take 0.5 tablets (12.5 mg total) by mouth daily.  . Vitamin D, Ergocalciferol, (DRISDOL) 1.25 MG (50000 UT) CAPS capsule Take 50,000 Units by mouth every 7 (seven) days.  . [DISCONTINUED] carvedilol (COREG) 6.25 MG tablet Take 1 tablet (6.25 mg total) by mouth 2 (two) times daily with a meal.     Allergies:   Patient has no known allergies.   Social History    Tobacco Use  . Smoking status: Never Smoker  . Smokeless tobacco: Never Used  Substance Use Topics  . Alcohol use: Not Currently  . Drug use: Not Currently     Family Hx: The patient's family history includes Heart disease (age of onset: 40) in his mother.  ROS:   Please see the history of present illness.    ROS All other systems reviewed and are negative.   EKGs/Labs/Other Test Reviewed:    EKG:  EKG is   ordered today.  The ekg ordered today demonstrates normal sinus rhythm, heart rate 93, normal axis, no ST-T wave changes, QTC 467  Recent Labs: 07/31/2019: B Natriuretic Peptide 549.0 08/01/2019: ALT 19 08/04/2019: Magnesium 2.2 08/06/2019: BUN 15; Creatinine, Ser 1.55; Hemoglobin 14.8; Platelets 309; Potassium 4.4; Sodium 137   HIV:  NON REACTIVE (11/12 2326)   TSH, Ferritin, TIBC: Patient notes PCP recently obtained  Recent Lipid Panel No results found for: CHOL, TRIG, HDL, CHOLHDL, LDLCALC, LDLDIRECT    Physical Exam:    VS:  BP 122/78   Pulse (!) 101   Ht 5\' 11"  (1.803 m)   Wt (!) 345 lb 12.8 oz (156.9 kg)   SpO2 98%   BMI 48.23 kg/m     Wt Readings from Last 3 Encounters:  08/12/19 (!) 345 lb 12.8 oz (156.9 kg)  08/06/19 (!) 345 lb 14.4 oz (156.9 kg)     Physical Exam  Constitutional: He is oriented to person, place, and time. He appears well-developed and well-nourished. No distress.  HENT:  Head: Normocephalic and atraumatic.  Eyes: No scleral icterus.  Neck: No JVD present. No thyromegaly present.  Cardiovascular: Normal rate, regular rhythm and normal heart sounds.  No murmur heard. Pulmonary/Chest: Effort normal and breath sounds normal. He has no rales.  Abdominal: Soft. There is no hepatomegaly.  Musculoskeletal:        General: No edema.  Lymphadenopathy:    He has no cervical adenopathy.  Neurological: He is alert and oriented to person, place, and time.  Skin: Skin is warm and dry.  Psychiatric: He has a normal mood and affect.     ASSESSMENT & PLAN:    1. HFrEF (heart failure with reduced ejection fraction) (HCC) EF 20-25%.  NYHA 1-2.  Volume status stable.  Cardiac catheterization during recent admission demonstrated mild to moderate nonobstructive disease.  Therefore, he has a nonischemic cardiomyopathy.  I will ask for recent ferritin and TSH from his PCP.  HIV in the hospital was nonreactive.  I will review further with our CHF team to determine if he needs cardiac MRI or PYP scan.  Continue current dose of Entresto, furosemide, spironolactone.  Heart rate is elevated.  Therefore, I will increase his carvedilol to 12.5 mg twice daily.  We discussed the importance of daily weights and when to call.  We also discussed the importance of low-sodium diet as well as weight loss.  He prefers to follow-up in [place] at this office.  Since  he saw Dr. Eldridge Dace in the hospital, I will follow him here along with Dr. Eldridge Dace.  Follow-up in 3 weeks in person or virtual.  2. Coronary artery disease involving native coronary artery of native heart without angina pectoris Mild to moderate nonobstructive disease by cardiac catheterization.  He is not having anginal symptoms.  Continue aspirin, statin.  3. Essential hypertension The patient's blood pressure is controlled on his current regimen.  Continue current therapy.    Dispo:  Return in about 3 weeks (around 09/02/2019) for Routine Follow Up, w/ Tereso Newcomer, PA-C, (virtual or in-person).   Medication Adjustments/Labs and Tests Ordered: Current medicines are reviewed at length with the patient today.  Concerns regarding medicines are outlined above.  Tests Ordered: Orders Placed This Encounter  Procedures  . Basic Metabolic Panel (BMET)  . EKG 12-Lead   Medication Changes: Meds ordered this encounter  Medications  . carvedilol (COREG) 12.5 MG tablet    Sig: Take 1 tablet (12.5 mg total) by mouth 2 (two) times daily.    Dispense:  180 tablet    Refill:  3    Signed,  Tereso Newcomer, PA-C  08/12/2019 12:16 PM    [facility] [address] [place], [place], [place]  [address] Phone: [phone]; Fax: [phone]

## 2019-08-12 ENCOUNTER — Encounter: Payer: Self-pay | Admitting: Physician Assistant

## 2019-08-12 ENCOUNTER — Ambulatory Visit: Payer: BC Managed Care – PPO | Admitting: Physician Assistant

## 2019-08-12 ENCOUNTER — Other Ambulatory Visit: Payer: Self-pay

## 2019-08-12 VITALS — BP 122/78 | HR 101 | Ht 71.0 in | Wt 345.8 lb

## 2019-08-12 DIAGNOSIS — I5020 Unspecified systolic (congestive) heart failure: Secondary | ICD-10-CM | POA: Diagnosis not present

## 2019-08-12 DIAGNOSIS — I2510 Atherosclerotic heart disease of native coronary artery without angina pectoris: Secondary | ICD-10-CM

## 2019-08-12 DIAGNOSIS — I10 Essential (primary) hypertension: Secondary | ICD-10-CM

## 2019-08-12 LAB — BASIC METABOLIC PANEL
BUN/Creatinine Ratio: 10 (ref 9–20)
BUN: 16 mg/dL (ref 6–24)
CO2: 25 mmol/L (ref 20–29)
Calcium: 10 mg/dL (ref 8.7–10.2)
Chloride: 98 mmol/L (ref 96–106)
Creatinine, Ser: 1.56 mg/dL — ABNORMAL HIGH (ref 0.76–1.27)
GFR calc Af Amer: 60 mL/min/{1.73_m2} (ref >59)
GFR calc non Af Amer: 52 mL/min/{1.73_m2} — ABNORMAL LOW (ref >59)
Glucose: 223 mg/dL — ABNORMAL HIGH (ref 65–99)
Potassium: 5.1 mmol/L (ref 3.5–5.2)
Sodium: 135 mmol/L (ref 134–144)

## 2019-08-12 MED ORDER — CARVEDILOL 12.5 MG PO TABS: 12.5000 mg | ORAL_TABLET | Freq: Two times a day (BID) | ORAL | 3 refills | Status: DC

## 2019-08-12 NOTE — Patient Instructions (Signed)
Medication Instructions:  Your physician has recommended you make the following change in your medication:  1. INCREASE COREG TO 12.5 MG TWICE DAILY  *If you need a refill on your cardiac medications before your next appointment, please call your pharmacy*  Lab Work: TODAY: BMET  If you have labs (blood work) drawn today and your tests are completely normal, you will receive your results only by: Marland Kitchen MyChart Message (if you have MyChart) OR . A paper copy in the mail If you have any lab test that is abnormal or we need to change your treatment, we will call you to review the results.  Testing/Procedures: NONE  Follow-Up: At [facility], you and your health needs are our priority.  As part of our continuing mission to provide you with exceptional heart care, we have created designated Provider Care Teams.  These Care Teams include your primary Cardiologist (physician) and Advanced Practice Providers (APPs -  Physician Assistants and Nurse Practitioners) who all work together to provide you with the care you need, when you need it.  Your next appointment:   12/16 at 10:45 am  The format for your next appointment:   Virtual Visit   Provider:   Richardson Dopp, PA-C

## 2019-08-27 ENCOUNTER — Telehealth: Payer: Self-pay | Admitting: Interventional Cardiology

## 2019-08-27 MED ORDER — CARVEDILOL 12.5 MG PO TABS: 12.5000 mg | ORAL_TABLET | Freq: Two times a day (BID) | ORAL | 3 refills | Status: DC

## 2019-08-27 MED ORDER — SPIRONOLACTONE 25 MG PO TABS: 12.5000 mg | ORAL_TABLET | Freq: Every day | ORAL | 3 refills | Status: DC

## 2019-08-27 NOTE — Telephone Encounter (Signed)
°*  STAT* If patient is at the pharmacy, call can be transferred to refill team.   1. Which medications need to be refilled? (please list name of each medication and dose if known)  carvedilol (COREG) 12.5 MG tablet spironolactone (ALDACTONE) 25 MG tablet  2. Which pharmacy/location (including street and city if local pharmacy) is medication to be sent to?     [id number] - [place], [place] - [address] AT [address]      3. Do they need a 30 day or 90 day supply? 90 day  Patient says he is almost out of his medication

## 2019-08-27 NOTE — Telephone Encounter (Signed)
Pt's medications were sent to pt's pharmacy as requested. Confirmation received.  

## 2019-09-02 NOTE — Progress Notes (Signed)
Virtual Visit via Telephone Note   This visit type was conducted due to national recommendations for restrictions regarding the COVID-19 Pandemic (e.g. social distancing) in an effort to limit this patient's exposure and mitigate transmission in our community.  Due to his co-morbid illnesses, this patient is at least at moderate risk for complications without adequate follow up.  This format is felt to be most appropriate for this patient at this time.  The patient did not have access to video technology/had technical difficulties with video requiring transitioning to audio format only (telephone).  All issues noted in this document were discussed and addressed.  No physical exam could be performed with this format.  Please refer to the patient's chart for his  consent to telehealth for [facility].   Date:  09/03/2019   ID:  Gary Jacobs, DOB 1970-10-26, MRN [id number]  Patient Location: Home Provider Location: Home  PCP:  Caffie Damme, MD  Cardiologist:  Lance Muss, MD   Electrophysiologist:  None   Evaluation Performed:  Follow-Up Visit  Chief Complaint: CHF  History of Present Illness:    Gary Jacobs is a 48 y.o. male with  Chronic systolic CHF ? Nonischemic cardiomyopathy ? Echo 07/2019: EF 20-25  Coronary artery disease  ? Nonobstructive by cardiac catheterization 07/2019  Diabetes mellitus   Hypertension   Hyperlipidemia   OSA   He was last seen 08/12/2019.  I adjusted his beta-blocker Rx.  I reviewed his case after this visit with Dr. Shirlee Latch and he suggested the patient have a cMRI.  This will be obtained once he is on maximal GDMT for CHF.  Today, he notes he is doing well.  He is exercising every day.  He is walking about 40 minutes twice a day on the treadmill.  He has not had any shortness of breath, chest discomfort, syncope or near syncope, orthopnea or leg swelling.  His weights have remained stable.  He notes that his iron levels were low and  his PCP placed him on ferrous sulfate.  TSH in September 2020 was normal at 2.13.     Past Medical History:  Diagnosis Date  . CHF (congestive heart failure) (HCC)   . Diabetes mellitus without complication (HCC)   . Dyslipidemia   . Hypertension   . Sleep apnea    Past Surgical History:  Procedure Laterality Date  . RIGHT/LEFT HEART CATH AND CORONARY ANGIOGRAPHY N/A 08/04/2019   Procedure: RIGHT/LEFT HEART CATH AND CORONARY ANGIOGRAPHY;  Surgeon: Yvonne Kendall, MD;  Location: [facility] INVASIVE CV LAB;  Service: Cardiovascular;  Laterality: N/A;     Current Meds  Medication Sig  . aspirin EC 81 MG EC tablet Take 1 tablet (81 mg total) by mouth daily.  Marland Kitchen atorvastatin (LIPITOR) 40 MG tablet Take 40 mg by mouth at bedtime.  . carvedilol (COREG) 12.5 MG tablet Take 1 tablet (12.5 mg total) by mouth 2 (two) times daily.  . Dulaglutide (TRULICITY) 1.5 MG/0.5ML SOPN Inject 1.5 mg into the skin once a week.  . furosemide (LASIX) 40 MG tablet Take 1 tablet (40 mg total) by mouth daily.  Marland Kitchen glipiZIDE (GLUCOTROL XL) 10 MG 24 hr tablet Take 10 mg by mouth daily with breakfast.  . metFORMIN (GLUCOPHAGE) 500 MG tablet Take 500 mg by mouth 2 (two) times daily with a meal.  . Multiple Vitamin (MULTIVITAMIN WITH MINERALS) TABS tablet Take 1 tablet by mouth daily.  . sacubitril-valsartan (ENTRESTO) 24-26 MG Take 1 tablet by mouth 2 (two) times daily.  Marland Kitchen  spironolactone (ALDACTONE) 25 MG tablet Take 0.5 tablets (12.5 mg total) by mouth daily.  . Vitamin D, Ergocalciferol, (DRISDOL) 1.25 MG (50000 UT) CAPS capsule Take 50,000 Units by mouth every 7 (seven) days.  . [DISCONTINUED] furosemide (LASIX) 40 MG tablet Take 1 tablet (40 mg total) by mouth daily.  . [DISCONTINUED] sacubitril-valsartan (ENTRESTO) 24-26 MG Take 1 tablet by mouth 2 (two) times daily.     Allergies:   Patient has no known allergies.   Social History   Tobacco Use  . Smoking status: Never Smoker  . Smokeless tobacco: Never Used    Substance Use Topics  . Alcohol use: Not Currently  . Drug use: Not Currently     Family Hx: The patient's family history includes Heart disease (age of onset: 28) in his mother.  ROS:   Please see the history of present illness.    All other systems reviewed and are negative.   Prior CV studies:   The following studies were reviewed today:   R/L Cardiac catheterization 08/04/2019 LM normal LAD prox 35 LCx normal  RCA normal RA (mean): 10 mmHg RV (S/EDP): 60/12 mmHg PA (S/D, mean): 60/30 (40) mmHg PCWP (mean): 25-30 mmHg LVEDP 30  Ao sat: 96% PA sat: 60%  Fick CO: 5.4 L/min Fick CI: 2.0 L/min/m^2  Right LE venous duplex 08/01/2019 Summary: Right: There is no evidence of deep vein thrombosis in the lower extremity. However, portions of this examination were limited- see technologist comments above. No cystic structure found in the popliteal fossa. Left: No evidence of common femoral vein obstruction.  Echocardiogram 08/01/2019 EF 20-25, low normal RV SF, severe LAE, moderate RAE, mild to moderate MR, mild to moderate TR, trivial AI, RVSP 30.6  Labs/Other Tests and Data Reviewed:    EKG:  No ECG reviewed.  Recent Labs: 07/31/2019: B Natriuretic Peptide 549.0 08/01/2019: ALT 19 08/04/2019: Magnesium 2.2 08/06/2019: Hemoglobin 14.8; Platelets 309 08/12/2019: BUN 16; Creatinine, Ser 1.56; Potassium 5.1; Sodium 135   Recent Lipid Panel No results found for: CHOL, TRIG, HDL, CHOLHDL, LDLCALC, LDLDIRECT  Wt Readings from Last 3 Encounters:  09/03/19 (!) 341 lb (154.7 kg)  08/12/19 (!) 345 lb 12.8 oz (156.9 kg)  08/06/19 (!) 345 lb 14.4 oz (156.9 kg)     Objective:    Vital Signs:  BP 102/74   Pulse 74   Ht 5\' 11"  (1.803 m)   Wt (!) 341 lb (154.7 kg)   BMI 47.56 kg/m    VITAL SIGNS:  reviewed GEN:  no acute distress RESPIRATORY:  Normal respiratory effort NEURO:  Alert and oriented PSYCH:  Normal mood  ASSESSMENT & PLAN:    1. HFrEF (heart  failure with reduced ejection fraction) (HCC) EF 20-25.  NYHA 1-2.  Volume status seems to be stable.  He is actually doing very well.  His blood pressure is low enough that I do not think he will tolerate further increases in Entresto or spironolactone.  Therefore, he will continue on his current medical therapy.  We will plan a cardiac MRI in 6 to 8 weeks to reassess LV function and to rule out infiltrative cardiomyopathy.  I will see him back in the office in 8 to 10 weeks.  2. Coronary artery disease involving native coronary artery of native heart without angina pectoris Mild to moderate nonobstructive disease by cardiac catheterization.  He is not having anginal symptoms.  Continue aspirin, statin.  3. Essential hypertension The patient's blood pressure is controlled on his current regimen.  Continue current therapy.    Time:   Today, I have spent 14 minutes with the patient with telehealth technology discussing the above problems.     Medication Adjustments/Labs and Tests Ordered: Current medicines are reviewed at length with the patient today.  Concerns regarding medicines are outlined above.   Tests Ordered: Orders Placed This Encounter  Procedures  . MR Card Morphology Wo/W Cm    Medication Changes: Meds ordered this encounter  Medications  . furosemide (LASIX) 40 MG tablet    Sig: Take 1 tablet (40 mg total) by mouth daily.    Dispense:  90 tablet    Refill:  3  . sacubitril-valsartan (ENTRESTO) 24-26 MG    Sig: Take 1 tablet by mouth 2 (two) times daily.    Dispense:  180 tablet    Refill:  3    Follow Up:  In Person in 8-10 week(s)  Signed, Tereso Newcomer, PA-C  09/03/2019 5:41 PM    [facility]

## 2019-09-03 ENCOUNTER — Encounter: Payer: Self-pay | Admitting: Physician Assistant

## 2019-09-03 ENCOUNTER — Telehealth (INDEPENDENT_AMBULATORY_CARE_PROVIDER_SITE_OTHER): Payer: BC Managed Care – PPO | Admitting: Physician Assistant

## 2019-09-03 ENCOUNTER — Other Ambulatory Visit: Payer: Self-pay

## 2019-09-03 VITALS — BP 102/74 | HR 74 | Ht 71.0 in | Wt 341.0 lb

## 2019-09-03 DIAGNOSIS — I10 Essential (primary) hypertension: Secondary | ICD-10-CM

## 2019-09-03 DIAGNOSIS — I5020 Unspecified systolic (congestive) heart failure: Secondary | ICD-10-CM

## 2019-09-03 DIAGNOSIS — I2510 Atherosclerotic heart disease of native coronary artery without angina pectoris: Secondary | ICD-10-CM

## 2019-09-03 MED ORDER — FUROSEMIDE 40 MG PO TABS: 40.0000 mg | ORAL_TABLET | Freq: Every day | ORAL | 3 refills | Status: DC

## 2019-09-03 MED ORDER — SACUBITRIL-VALSARTAN 24-26 MG PO TABS: 1.0000 | ORAL_TABLET | Freq: Two times a day (BID) | ORAL | 3 refills | Status: AC

## 2019-09-03 NOTE — Patient Instructions (Addendum)
Medication Instructions:  Your physician recommends that you continue on your current medications as directed. Please refer to the Current Medication list given to you today.  *If you need a refill on your cardiac medications before your next appointment, please call your pharmacy*  Lab Work:  None ordered today  Testing/Procedures:  Your physician has requested that you have a cardiac MRI. Cardiac MRI uses a computer to create images of your heart as its beating, producing both still and moving pictures of your heart and major blood vessels. For further information please visit [email or web address] Please follow the instruction sheet given to you today for more information.   Follow-Up:  With Richardson Dopp, PA-C in 8-10 weeks after MRI is completed.

## 2019-09-05 ENCOUNTER — Telehealth: Payer: Self-pay | Admitting: Physician Assistant

## 2019-09-05 NOTE — Telephone Encounter (Signed)
New Message  I have Gary Jacobs on the line and he is looking to get documents from what scott weaver has done to him. he wanted to know if he could come by and get a summary of it   Please call to discuss

## 2019-09-09 ENCOUNTER — Telehealth: Payer: Self-pay | Admitting: *Deleted

## 2019-09-09 ENCOUNTER — Encounter: Payer: Self-pay | Admitting: *Deleted

## 2019-09-09 ENCOUNTER — Telehealth: Payer: Self-pay | Admitting: Physician Assistant

## 2019-09-09 NOTE — Telephone Encounter (Signed)
Left message regarding appointment for Cardiac MRI scheduled 10/02/19 at 3:00pm---arrival time 2:15pm 1st floor admissions office at [facility].  Will mail information to patient

## 2019-09-09 NOTE — Telephone Encounter (Signed)
Patient returned phone.  He is asking about testing to check EF again.  Last Echo  A month ago showed EF of 20-25%.  Pt is scheduled for Cardiac MRI on 1/14, advised pt that this test will provide his EF% encouraged patient to continue taking medications as ordered and exercising to help with improving EF.  Pt indicates he has paperwork for disability that will need to be completed, informed can send to office or drop off.

## 2019-09-09 NOTE — Telephone Encounter (Signed)
Attempted to reach pt, no answer, left generic message on unidentified VM to callback.

## 2019-09-09 NOTE — Telephone Encounter (Signed)
New message    Patient calling, would like to discuss his EF%. Patient states he can not return to work until EF% greater than 40. He would like to know what testing can be done sooner to determine current EF. He is concerned about returning to work.  Please call

## 2019-09-17 ENCOUNTER — Telehealth: Payer: Self-pay | Admitting: Physician Assistant

## 2019-09-17 NOTE — Telephone Encounter (Signed)
  1. Are you calling in reference to your FMLA or disability form? yes  2. What is your question in regards to FMLA or disability form? Patient states he dropped forms off to the office last Wednesday (12/23) for Richardson Dopp to sign about getting a short term disability. The patient states he has not heard anything from the office about the forms being completed yet   3. Do you need copies of your medical records? no  4. Are you waiting on a nurse to call you back with results or are you wanting copies of your results? no  Patient states that due to his heart function he is unable to work. HE needs the forms completed so he can continue getting short term disability

## 2019-09-18 NOTE — Telephone Encounter (Signed)
I called and spoke with patient, he is aware that Scott picked up the forms earlier this week filled them out and faxed the forms to the number listed. Patient will make sure forms were received and call back if they weren't.

## 2019-09-18 NOTE — Telephone Encounter (Signed)
The form was in the papers I picked up from the office earlier this week.  I have faxed the form to the number provided.  Tell patient to call if the fax is not received. Richardson Dopp, PA-C    09/18/2019 11:40 AM

## 2019-10-01 ENCOUNTER — Telehealth (HOSPITAL_COMMUNITY): Payer: Self-pay | Admitting: Emergency Medicine

## 2019-10-01 NOTE — Telephone Encounter (Signed)
Reaching out to patient to offer assistance regarding upcoming cardiac imaging study; pt verbalizes understanding of appt date/time, parking situation and where to check in, and verified current allergies; name and call back number provided for further questions should they arise Rockwell [place] RN Navigator Cardiac Imaging [facility] [phone] office [phone] cell  Pt denies claustophobia, denies metal implants, denies allergies

## 2019-10-02 ENCOUNTER — Ambulatory Visit (HOSPITAL_COMMUNITY)
Admission: RE | Admit: 2019-10-02 | Discharge: 2019-10-02 | Disposition: A | Discharge status: Home / Self Care | Payer: BC Managed Care – PPO | Source: Ambulatory Visit | Attending: Physician Assistant | Admitting: Physician Assistant

## 2019-10-02 ENCOUNTER — Other Ambulatory Visit: Payer: Self-pay

## 2019-10-02 DIAGNOSIS — I5020 Unspecified systolic (congestive) heart failure: Secondary | ICD-10-CM | POA: Diagnosis not present

## 2019-10-02 LAB — CREATININE, SERUM
Creatinine, Ser: 1.38 mg/dL — ABNORMAL HIGH (ref 0.61–1.24)
GFR calc Af Amer: >60 mL/min (ref >60)
GFR calc non Af Amer: >60 mL/min (ref >60)

## 2019-10-02 MED ORDER — GADOBUTROL 1 MMOL/ML IV SOLN
14.0000 mL | Freq: Once | INTRAVENOUS | Status: AC | PRN
  Administered 2019-10-02: 14 mL via INTRAVENOUS

## 2019-10-08 ENCOUNTER — Telehealth: Payer: Self-pay | Admitting: Interventional Cardiology

## 2019-10-08 NOTE — Telephone Encounter (Signed)
I will route call to Dillard Cannon. CMA for [employer]. PAC as well as to [place] RN for Dr. Eldridge Dace.

## 2019-10-08 NOTE — Telephone Encounter (Signed)
I called and spoke with patient, he states that because his ejection fraction was under 40%, he can not go back to work per [employer]. He would like a letter for work.

## 2019-10-08 NOTE — Telephone Encounter (Signed)
Patient calling requesting a letter for his job stating he cannot come back to work at this time.

## 2019-10-09 ENCOUNTER — Other Ambulatory Visit: Payer: Self-pay

## 2019-10-09 ENCOUNTER — Encounter: Payer: Self-pay | Admitting: Physician Assistant

## 2019-10-09 DIAGNOSIS — I5022 Chronic systolic (congestive) heart failure: Secondary | ICD-10-CM | POA: Insufficient documentation

## 2019-10-09 DIAGNOSIS — I5020 Unspecified systolic (congestive) heart failure: Secondary | ICD-10-CM

## 2019-10-09 DIAGNOSIS — I428 Other cardiomyopathies: Secondary | ICD-10-CM | POA: Insufficient documentation

## 2019-10-09 MED ORDER — SACUBITRIL-VALSARTAN 24-26 MG PO TABS: 1.0000 | ORAL_TABLET | Freq: Two times a day (BID) | ORAL | 6 refills | Status: DC

## 2019-10-09 NOTE — Telephone Encounter (Signed)
I called and spoke with patient, he is aware that the letter is ready for pick up. Letter placed downstairs for patient pick up.

## 2019-10-09 NOTE — Progress Notes (Signed)
Referral to heart failure clinic started, MRI results faxed to PCP, and Entresto added back to patients list.

## 2019-10-09 NOTE — Telephone Encounter (Signed)
Letter completed. It printed in [facility]. Please see MRI result note. Tereso Newcomer, PA-C    10/09/2019 9:08 AM

## 2019-10-10 ENCOUNTER — Telehealth: Payer: Self-pay | Admitting: Interventional Cardiology

## 2019-10-10 NOTE — Telephone Encounter (Signed)
We did not specifically take him out of work. The messages I received were that he was not able to drive because his EF is low and he would not be able to resume driving until his EF is over 40%.  His most recent study (cardiac MRI) shows that his EF is still low at 21%.  At this point, it is not clear if/when his EF will improve.  Therefore, I cannot provide a return to work date based upon this.  He can return to work now if his employer can provide him a job that he is not restricted from doing.  If his employer's or the state's policy is that he cannot drive if his EF is less than 40%, he is unable to work because of that policy.   I can change the letter to remain out of work indefinitely.  I am happy to do that if he would like. Tereso Newcomer, PA-C    10/10/2019 1:18 PM

## 2019-10-10 NOTE — Telephone Encounter (Signed)
I spoke to the patient and he would like a letter to remain out of work indefinitely, please.  Thank you.

## 2019-10-10 NOTE — Telephone Encounter (Signed)
I updated the letter and printed it to [employer] [facility]. Thanks, Tereso Newcomer, PA-C    10/10/2019 2:14 PM

## 2019-10-10 NOTE — Telephone Encounter (Signed)
New Message    Pt is calling and says his work letter is needing it to state when he can return back to work      Please advise

## 2019-10-10 NOTE — Telephone Encounter (Signed)
I printed letter and placed downstairs for pick up.  Patient aware.

## 2019-10-13 ENCOUNTER — Other Ambulatory Visit: Payer: Self-pay

## 2019-10-13 ENCOUNTER — Telehealth (HOSPITAL_COMMUNITY): Payer: Self-pay | Admitting: Vascular Surgery

## 2019-10-13 NOTE — Telephone Encounter (Signed)
left pt message to make new chf appt w/ either provider, if pt calls back today (10/13/19) he can be scheduled w/ [facility] @ 320 10/14/19

## 2019-11-17 ENCOUNTER — Telehealth (HOSPITAL_COMMUNITY): Payer: Self-pay

## 2019-11-17 NOTE — Telephone Encounter (Signed)

## 2019-11-18 ENCOUNTER — Other Ambulatory Visit: Payer: Self-pay

## 2019-11-18 ENCOUNTER — Ambulatory Visit (HOSPITAL_COMMUNITY)
Admission: RE | Admit: 2019-11-18 | Discharge: 2019-11-18 | Disposition: A | Discharge status: Home / Self Care | Payer: BC Managed Care – PPO | Source: Ambulatory Visit | Attending: Cardiology | Admitting: Cardiology

## 2019-11-18 ENCOUNTER — Encounter (HOSPITAL_COMMUNITY): Payer: Self-pay | Admitting: Cardiology

## 2019-11-18 VITALS — BP 118/82 | HR 79 | Wt 348.4 lb

## 2019-11-18 DIAGNOSIS — G4733 Obstructive sleep apnea (adult) (pediatric): Secondary | ICD-10-CM | POA: Insufficient documentation

## 2019-11-18 DIAGNOSIS — Z86718 Personal history of other venous thrombosis and embolism: Secondary | ICD-10-CM | POA: Diagnosis not present

## 2019-11-18 DIAGNOSIS — Z7984 Long term (current) use of oral hypoglycemic drugs: Secondary | ICD-10-CM | POA: Insufficient documentation

## 2019-11-18 DIAGNOSIS — I5020 Unspecified systolic (congestive) heart failure: Secondary | ICD-10-CM | POA: Diagnosis not present

## 2019-11-18 DIAGNOSIS — I5022 Chronic systolic (congestive) heart failure: Secondary | ICD-10-CM

## 2019-11-18 DIAGNOSIS — N183 Chronic kidney disease, stage 3 unspecified: Secondary | ICD-10-CM | POA: Insufficient documentation

## 2019-11-18 DIAGNOSIS — I130 Hypertensive heart and chronic kidney disease with heart failure and stage 1 through stage 4 chronic kidney disease, or unspecified chronic kidney disease: Secondary | ICD-10-CM | POA: Diagnosis not present

## 2019-11-18 DIAGNOSIS — Z7982 Long term (current) use of aspirin: Secondary | ICD-10-CM | POA: Insufficient documentation

## 2019-11-18 DIAGNOSIS — E785 Hyperlipidemia, unspecified: Secondary | ICD-10-CM | POA: Insufficient documentation

## 2019-11-18 DIAGNOSIS — I2510 Atherosclerotic heart disease of native coronary artery without angina pectoris: Secondary | ICD-10-CM | POA: Diagnosis not present

## 2019-11-18 DIAGNOSIS — Z79899 Other long term (current) drug therapy: Secondary | ICD-10-CM | POA: Insufficient documentation

## 2019-11-18 DIAGNOSIS — I428 Other cardiomyopathies: Secondary | ICD-10-CM | POA: Diagnosis not present

## 2019-11-18 DIAGNOSIS — E1122 Type 2 diabetes mellitus with diabetic chronic kidney disease: Secondary | ICD-10-CM | POA: Diagnosis not present

## 2019-11-18 LAB — BASIC METABOLIC PANEL
Anion gap: 9 (ref 5–15)
BUN: 15 mg/dL (ref 6–20)
CO2: 25 mmol/L (ref 22–32)
Calcium: 9.5 mg/dL (ref 8.9–10.3)
Chloride: 102 mmol/L (ref 98–111)
Creatinine, Ser: 1.15 mg/dL (ref 0.61–1.24)
GFR calc Af Amer: >60 mL/min (ref >60)
GFR calc non Af Amer: >60 mL/min (ref >60)
Glucose, Bld: 164 mg/dL — ABNORMAL HIGH (ref 70–99)
Potassium: 4.5 mmol/L (ref 3.5–5.1)
Sodium: 136 mmol/L (ref 135–145)

## 2019-11-18 MED ORDER — SACUBITRIL-VALSARTAN 49-51 MG PO TABS: 1.0000 | ORAL_TABLET | Freq: Two times a day (BID) | ORAL | 5 refills | Status: DC

## 2019-11-18 NOTE — Patient Instructions (Signed)
INCREASE Entresto to 49/51mg  (1 tab) twice a day   Labs today and repeat in 10 days We will only contact you if something comes back abnormal or we need to make some changes. Otherwise no news is good news!   Your physician recommends that you schedule a follow-up appointment in: 3 weeks with the Pharmacist and 6 weeks with Dr Shirlee Latch.     Please call office at [phone] option 2 if you have any questions or concerns.    At the [facility], you and your health needs are our priority. As part of our continuing mission to provide you with exceptional heart care, we have created designated Provider Care Teams. These Care Teams include your primary Cardiologist (physician) and Advanced Practice Providers (APPs- Physician Assistants and Nurse Practitioners) who all work together to provide you with the care you need, when you need it.   You may see any of the following providers on your designated Care Team at your next follow up: Marland Kitchen Dr Arvilla Meres . Dr Marca Ancona . Tonye Becket, NP . Robbie Lis, PA . Karle Plumber, PharmD   Please be sure to bring in all your medications bottles to every appointment.

## 2019-11-18 NOTE — Progress Notes (Signed)
PCP: Glendon Axe, MD HF Cardiology: Dr. Aundra Dubin  49 y.o. with history of HTN, DM, OSA on CPAP, and nonischemic cardiomyopathy was referred by Richardson Dopp for evaluation of CHF.  Patient was admitted in 11/20 with dyspnea and leg swelling. He was found to have CHF.  Echo in 11/20 showed EF 20-25%.  LHC/RHC showed elevated filling pressures, CI 2.0, and mild nonobstructive CAD. He was started on a regimen of cardiac meds.  Cardiac MRI in 1/21 showed EF still low at 21% with severely dilated LV.  There was no late gadolinium enhancement.    He is doing well symptomatically.  He walks on a treadmill for up to an hour at times and works out with light weights, all without dyspnea.  No problems walking up stairs.  No lightheadedness.   No orthopnea/PND.    Labs (11/20): K 5.1, creatinine 1.56 Labs (1/21): creatinine 1.38  ECG (personally reviewed): NSR, LVH  PMH: 1. Type 2 diabetes 2. HTN 3. Hyperlipidemia 4. OSA: Uses CPAP.  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  6. CKD stage 3 7. Chronic systolic CHF: Nonischemic cardiomyopathy.  - Echo (11/20): EF 20-25%, mild-moderate MR.  - LHC (11/20): Nonobstructive CAD; mean RA 10, PA 60/30 mean 40, mean PCWP 27, CI 2.0.  - Cardiac MRI (1/21): Severe LV enlargement with EF 21%, global hypokinesis, RV EF 33%, no LGE.   SH: Married, works as Administrator.  Used to play arena football.  No ETOH, no smoking.   FH: Mother with "heart issues," died when he was born.  No other known heart disease.   ROS: All systems reviewed and negative except as per HPI.   Current Outpatient Medications  Medication Sig Dispense Refill  . aspirin 81 MG chewable tablet Chew 81 mg by mouth daily.    Marland Kitchen atorvastatin (LIPITOR) 40 MG tablet Take 40 mg by mouth at bedtime.    . carvedilol (COREG) 12.5 MG tablet Take 1 tablet (12.5 mg total) by mouth 2 (two) times daily. 180 tablet 3  . Dulaglutide (TRULICITY) 1.5 WC/3.7SE SOPN Inject 1.5 mg into the  skin once a week.    . ferrous sulfate 325 (65 FE) MG tablet Take 325 mg by mouth daily with breakfast.    . furosemide (LASIX) 40 MG tablet Take 1 tablet (40 mg total) by mouth daily. 90 tablet 3  . glipiZIDE (GLUCOTROL XL) 10 MG 24 hr tablet Take 10 mg by mouth daily with breakfast.    . metFORMIN (GLUCOPHAGE) 1000 MG tablet Take 1,000 mg by mouth 2 (two) times daily with a meal.     . Multiple Vitamin (MULTIVITAMIN WITH MINERALS) TABS tablet Take 1 tablet by mouth daily.    Marland Kitchen spironolactone (ALDACTONE) 25 MG tablet Take 0.5 tablets (12.5 mg total) by mouth daily. 45 tablet 3  . Vitamin D, Ergocalciferol, (DRISDOL) 1.25 MG (50000 UT) CAPS capsule Take 50,000 Units by mouth every 7 (seven) days.    . sacubitril-valsartan (ENTRESTO) 49-51 MG Take 1 tablet by mouth 2 (two) times daily. 60 tablet 5   No current facility-administered medications for this encounter.   BP 118/82   Pulse 79   Wt (!) 158 kg (348 lb 6.4 oz)   SpO2 98%   BMI 48.59 kg/m  General: NAD, obese.  Neck: Thick, no JVD, no thyromegaly or thyroid nodule.  Lungs: Clear to auscultation bilaterally with normal respiratory effort. CV: Nondisplaced PMI.  Heart regular S1/S2, no S3/S4,  no murmur.  No peripheral edema.  No carotid bruit.  Normal pedal pulses.  Abdomen: Soft, nontender, no hepatosplenomegaly, no distention.  Skin: Intact without lesions or rashes.  Neurologic: Alert and oriented x 3.  Psych: Normal affect. Extremities: No clubbing or cyanosis.  HEENT: Normal.   Assessment/Plan: 1. Chronic systolic CHF:  Nonischemic cardiomyopathy. No definite family history of cardiomyopathy.  No ETOH or drugs. Cath with nonobstructive disease in 11/20.  Most recent study was a cardiac MRI in 1/21 with EF 21% and severe LV dilation. Possible viral myocarditis versus diabetic cardiomyopathy.  BP does not seem to have been markedly high in the past so doubt that HTN explains CMP.  On exam today, he is euvolemic.  NYHA class I-II  symptoms.  - Increase Entresto to 49/51 bid. BMET today and in 10 days.  - Continue Coreg 12.5 mg bid.  - Continue spironolactone 12.5 mg daily.  - Repeat echo in 3 months.  If EF remains low, given young age (despite nonischemic cardiomyopathy), would consider ICD.  He has a narrow QRS, not candidate for CRT.  2. CKD: Stage 3, likely due to DM and HTN.   - BMET today.  3. HTN: BP is controlled.  4. OSA: Continue to use CPAP.  5. Type 2 diabetes: Per PCP.  Should eventually add SGLT2 inhibitor.   Followup with HF pharmacist in 3 wks for medication titration, see me in 6 wks.   Marca Ancona 11/18/2019

## 2019-11-27 ENCOUNTER — Telehealth: Payer: Self-pay | Admitting: Physician Assistant

## 2019-11-27 ENCOUNTER — Encounter: Payer: Self-pay | Admitting: Physician Assistant

## 2019-11-27 NOTE — Telephone Encounter (Signed)
I called and spoke with patient, he states that the letter can not state that from a clinical standpoint he can drive. He states this is what messed his benefits up in the previous letter. Can you do letter without that statement?

## 2019-11-27 NOTE — Telephone Encounter (Signed)
Patient is called stating he needs an updated letter stating that he can't drive, because of he currently feels dizzy due to the medication he is on.  He lets his wife drive.  He was denied his short term disability. Do to his current condition he can't drive. At this time he has no insurance, so he can't afford his medication.

## 2019-11-27 NOTE — Telephone Encounter (Signed)
I wrote a letter and it is in his chart.  I will forward this to Lasandra Beech, LCSW with the CHF Clinic. He is followed by Dr. Shirlee Latch now.  Annice Pih - Can you help with his medications? Tereso Newcomer, PA-C    11/27/2019 4:21 PM

## 2019-11-28 ENCOUNTER — Encounter: Payer: Self-pay | Admitting: Physician Assistant

## 2019-11-28 ENCOUNTER — Ambulatory Visit (HOSPITAL_COMMUNITY)
Admission: RE | Admit: 2019-11-28 | Discharge: 2019-11-28 | Disposition: A | Discharge status: Home / Self Care | Payer: BC Managed Care – PPO | Source: Ambulatory Visit | Attending: Internal Medicine | Admitting: Internal Medicine

## 2019-11-28 ENCOUNTER — Other Ambulatory Visit: Payer: Self-pay

## 2019-11-28 ENCOUNTER — Telehealth: Payer: Self-pay | Admitting: Licensed Clinical Social Worker

## 2019-11-28 DIAGNOSIS — I5022 Chronic systolic (congestive) heart failure: Secondary | ICD-10-CM | POA: Diagnosis not present

## 2019-11-28 LAB — BASIC METABOLIC PANEL
Anion gap: 8 (ref 5–15)
BUN: 14 mg/dL (ref 6–20)
CO2: 27 mmol/L (ref 22–32)
Calcium: 9.7 mg/dL (ref 8.9–10.3)
Chloride: 103 mmol/L (ref 98–111)
Creatinine, Ser: 1.22 mg/dL (ref 0.61–1.24)
GFR calc Af Amer: >60 mL/min (ref >60)
GFR calc non Af Amer: >60 mL/min (ref >60)
Glucose, Bld: 191 mg/dL — ABNORMAL HIGH (ref 70–99)
Potassium: 4.6 mmol/L (ref 3.5–5.1)
Sodium: 138 mmol/L (ref 135–145)

## 2019-11-28 MED FILL — SPIRONOLACTONE 25 MG TABS: 25 | 34 days supply | Qty: 17 | Fill #0

## 2019-11-28 MED FILL — ATORVASTATIN 40 MG TABLET: 40 | 34 days supply | Qty: 34 | Fill #0

## 2019-11-28 MED FILL — CARVEDILOL 12.5 MG TABLET: 12.5 | 34 days supply | Qty: 68 | Fill #0

## 2019-11-28 MED FILL — FUROSEMIDE 40 MG TAB: 40 | 30 days supply | Qty: 100 | Fill #0

## 2019-11-28 NOTE — Telephone Encounter (Signed)
HF fund explained to patient and appropriate medications (atorvastatin,lasix,spiro,coreg) sent to pharmacy via fax  -will send 30 day supply of entresto for 30 day card via epic 30 day card and samples provided for entresto as [employer] patient assistance application started. Will notify pharmacist of application    Medication Samples have been provided to the patient.  Drug name: entreso       Strength: 49/51        Qty: 28  LOT: [id number]  Exp.Date: 06/2021  Dosing instructions: one tab twice daily   The patient has been instructed regarding the correct time, dose, and frequency of taking this medication, including desired effects and most common side effects.   Theresia Bough 11:41 AM 11/28/2019

## 2019-11-28 NOTE — Progress Notes (Signed)
Heart and Vascular Care Navigation  11/28/2019  Gary Jacobs 17-Dec-1970 [id number]  Reason for Referral: Patient referred for financial assistance with medications.  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 Patient lives at home with his disabled wife who is on dialysis and his 48 yo daughter. Currently, he has no income as his unemployment ran out and he is working with the [employer] to get disability/unemployemtn reinstated. Patient reports he has a pending medicaid application but has not heard any determination yet. He has some of his medications but shared concerns of inability to obtain refills as he has no insurance and no income. He states very supportive family members who has assisted with basic needs to help support them but running low.  HRT/VAS Care Coordination    Patients Home Cardiology Office  [facility] [address]   Living arrangements for the past 2 months  Single Family Home   Lives with:  Minor Children; Spouse 66 yo daughter and wife   Patient Current Insurance Coverage  Medicaid Pending   Patient Has Concern With Paying Medical Bills  Yes   Patient Concerns With Medical Bills  lost insurance due to health issues   Medical Bill Referrals:  pending medicaid   Does Patient Have Prescription Coverage?  No   Patient Prescription Assistance Programs  Other Needs Entresto patient assistance   Home Assistive Devices/Equipment  CBG Meter; Blood pressure cuff; CPAP; Eyeglasses; Scales      Social History: SDOH Screenings   Alcohol Screen:   . Last Alcohol Screening Score (AUDIT):   Depression (PHQ2-9):   . PHQ-2 Score:   Financial Resource Strain: High Risk  . Difficulty of Paying Living Expenses: Very hard  Food Insecurity: Food Insecurity Present  . Worried About Programme researcher, broadcasting/film/video in the Last Year: Often true  . Ran Out of Food in the Last Year: Often true  Housing: Low Risk   . Last  Housing Risk Score: 0  Physical Activity:   . Days of Exercise per Week:   . Minutes of Exercise per Session:   Social Connections:   . Frequency of Communication with Friends and Family:   . Frequency of Social Gatherings with Friends and Family:   . Attends Religious Services:   . Active Member of Clubs or Organizations:   . Attends Banker Meetings:   Marland Kitchen Marital Status:   Stress:   . Feeling of Stress :   Tobacco Use: Low Risk   . Smoking Tobacco Use: Never Smoker  . Smokeless Tobacco Use: Never Used  Transportation Needs: No Transportation Needs  . Lack of Transportation (Medical): No  . Lack of Transportation (Non-Medical): No    SDOH Interventions: Financial Resources:  Financial Strain Interventions: Other (Comment)(Assisted with medication assistance and will explore Patient Care Fund for rental assistance.) Provided resources for food and Patient Care Fund in the future to assist wiht rent.   Food Insecurity:  Food Insecurity Interventions: Assist with SNAP Application(Patient will apply for food stamps)  Housing Insecurity:   Will assist in the future as rent is currently paid up.  Transportation:    not an issue at this time    Other Care Navigation Interventions:   Inpatient/Outpatient Substance Abuse Counseling/Rehab Options Not an issue at this time  Provided Pharmacy assistance resources Other(Needs Entresto patient assistance)  Patient expressed Mental Health concerns No.at this time.  Patient Referred to:  Barriers to Care: Patient has limited income with only his wife's disability of $1600 supporting the family.   Follow-up plan: CSW will continue to work with patient on financial assistance resources.   Patient To Do: Patient will pick up needed medications, apply for food stamps and continue to work with [employer] for benefits.  Care Navigation Team To Do: CSW will coordinate with clinic staff on medication assistance and  financial/insurance assistance.  Follow-up with:  CSW will follow up with patient on next clinic appointment.  Raquel Sarna, [place], [place]

## 2019-11-28 NOTE — Telephone Encounter (Signed)
New letter in his chart.  I routed it to you as well. Tereso Newcomer, PA-C    11/28/2019 8:15 AM

## 2019-11-28 NOTE — Telephone Encounter (Signed)
I called and spoke with patient, he is aware that Lorin Picket has re written the letter. I will print letter Monday for patient, he will pick up the letter.

## 2019-12-01 ENCOUNTER — Telehealth (HOSPITAL_COMMUNITY): Payer: Self-pay | Admitting: Licensed Clinical Social Worker

## 2019-12-01 NOTE — Telephone Encounter (Addendum)
[employer] application had been completed when pt came in for labs last week but was an expired application so [employer] will not accept.  CSW called pt and explained situation- he has appt next week with pharmacist and will plan to complete application again during that time  No further needs at this time  Burna Sis, LCSW Clinical Social Worker [facility] Desk#: [phone] Cell#: [phone]

## 2019-12-04 ENCOUNTER — Telehealth (HOSPITAL_COMMUNITY): Payer: Self-pay | Admitting: Pharmacist

## 2019-12-04 NOTE — Telephone Encounter (Signed)
Advanced Heart Failure Patient Advocate Encounter   Patient was approved to receive Entresto from [employer].  Patient ID: [id number] Effective dates: 12/03/19 through 12/02/20  Karle Plumber, PharmD, BCPS, BCCP, CPP Heart Failure Clinic Pharmacist [phone]

## 2019-12-08 ENCOUNTER — Other Ambulatory Visit: Payer: Self-pay | Admitting: Physician Assistant

## 2019-12-08 MED ORDER — CARVEDILOL 12.5 MG PO TABS: 12.5000 mg | ORAL_TABLET | Freq: Two times a day (BID) | ORAL | 2 refills | Status: DC

## 2019-12-08 NOTE — Telephone Encounter (Signed)
Pt's medication was sent to pt's pharmacy as requested. Confirmation received.  °

## 2019-12-10 ENCOUNTER — Inpatient Hospital Stay (HOSPITAL_COMMUNITY)
Admission: RE | Admit: 2019-12-10 | Discharge: 2019-12-10 | Disposition: A | Discharge status: Home / Self Care | Payer: BC Managed Care – PPO | Source: Ambulatory Visit

## 2019-12-16 NOTE — Progress Notes (Signed)
PCP: Caffie Damme, MD HF Cardiology: Dr. Shirlee Latch  HPI:  49 y.o. with history of HTN, DM, OSA on CPAP, and nonischemic cardiomyopathy was referred by Tereso Newcomer for evaluation of CHF.  Patient was admitted in 11/20 with dyspnea and leg swelling. He was found to have CHF.  Echo in 11/20 showed EF 20-25%.  LHC/RHC showed elevated filling pressures, CI 2.0, and mild nonobstructive CAD. He was started on a regimen of cardiac meds.  Cardiac MRI in 1/21 showed EF still low at 21% with severely dilated LV.  There was no late gadolinium enhancement.    Recently presented to HF Clinic with Dr. Shirlee Latch on 11/18/19. He was doing well symptomatically.  He reported walking on a treadmill for up to an hour at times and works out with light weights, all without dyspnea.  No problems walking up stairs.  No lightheadedness.   No orthopnea/PND.   Today he returns to HF clinic for pharmacist medication titration. At last visit with MD, Sherryll Burger was increased to 49/51 mg BID. Overall he is feeling well today. No dizziness, lightheadedness, chest pain or palpitations. No SOB/DOE. He exercises by walking 45-60 minutes every day in addition to resistance training. His weight at home has been stable (338-341 lbs). His weight in clinic is 349.8 lbs, up 1 lb from last visit. He takes furosemide 40 mg daily and has not needed any extra. No LEE, PND or orthopnea. His appetite is good. He has cut out almost all fast foods and has been eating better. Taking all medications as prescribed and tolerating all medications.   HF Medications: Carvedilol 12.5 mg BID Entresto 49/51 mg BID Spironolactone 12.5 mg daily Furosemide 40 mg daily  Has the patient been experiencing any side effects to the medications prescribed?  no  Does the patient have any problems obtaining medications due to transportation or finances?   Yes - no prescription insurance. Obtains Entresto through [employer] patient assistance program. I sent spironolactone,  carvedilol, furosemide and atorvastatin to [facility] to fill through the Heart Failure Fund.  Understanding of regimen: good Understanding of indications: good Potential of compliance: good Patient understands to avoid NSAIDs. Patient understands to avoid decongestants.    Pertinent Lab Values (11/28/19): Marland Kitchen Serum creatinine 1.22, BUN 14, Potassium 4.6, Sodium 138  Vital Signs: . Weight: 349.8 lbs (last clinic weight: 348.4 lbs) . Blood pressure: 122/86  . Heart rate: 81   Assessment: 1. Chronic systolic CHF:  Nonischemic cardiomyopathy. No definite family history of cardiomyopathy.  No ETOH or drugs. Cath with nonobstructive disease in 11/20.  Most recent study was a cardiac MRI in 1/21 with EF 21% and severe LV dilation. Possible viral myocarditis versus diabetic cardiomyopathy.  BP does not seem to have been markedly high in the past so doubt that HTN explains CMP.   -On exam today, he is euvolemic.  NYHA class I-II symptoms.  - Continue furosemide 40 mg daily - Continue carvedilol 12.5 mg bid.  - Continue Entresto 49/51 mg bid.  Obtains through [employer] Patient Assistance Program. - Increase spironolactone to 25 mg daily. Repeat BMET in 1 week.  - Repeat echo in 3 months.  If EF remains low, given young age (despite nonischemic cardiomyopathy), would consider ICD.  He has a narrow QRS, not candidate for CRT.  2. CKD: Stage 3, likely due to DM and HTN.   - last BMET showed Scr stable at 1.22 3. HTN: BP is controlled.  4. OSA: Continue to use CPAP.  5. Type 2 diabetes: Per PCP.  Should eventually add SGLT2 inhibitor.  -Last hemoglbin A1C 9.7% (08/01/2019) -Continue metformin, glipizide and Trulicity.    Plan: 1) Medication changes: Based on clinical presentation, vital signs and recent labs will increase spironolactone to 25 mg daily. 2) Follow-up: 1 week with Dr. Rush Farmer, PharmD, BCPS, [facility], CPP Heart Failure Clinic  Pharmacist [phone]

## 2019-12-23 ENCOUNTER — Other Ambulatory Visit (HOSPITAL_COMMUNITY): Payer: Self-pay | Admitting: Cardiology

## 2019-12-23 ENCOUNTER — Ambulatory Visit (HOSPITAL_COMMUNITY)
Admission: RE | Admit: 2019-12-23 | Discharge: 2019-12-23 | Disposition: A | Discharge status: Home / Self Care | Payer: Self-pay | Source: Ambulatory Visit | Attending: Cardiology | Admitting: Cardiology

## 2019-12-23 ENCOUNTER — Other Ambulatory Visit: Payer: Self-pay

## 2019-12-23 VITALS — BP 122/86 | HR 81 | Wt 349.8 lb

## 2019-12-23 DIAGNOSIS — I428 Other cardiomyopathies: Secondary | ICD-10-CM | POA: Insufficient documentation

## 2019-12-23 DIAGNOSIS — I5022 Chronic systolic (congestive) heart failure: Secondary | ICD-10-CM | POA: Insufficient documentation

## 2019-12-23 DIAGNOSIS — G4733 Obstructive sleep apnea (adult) (pediatric): Secondary | ICD-10-CM | POA: Insufficient documentation

## 2019-12-23 DIAGNOSIS — E1122 Type 2 diabetes mellitus with diabetic chronic kidney disease: Secondary | ICD-10-CM | POA: Insufficient documentation

## 2019-12-23 DIAGNOSIS — I130 Hypertensive heart and chronic kidney disease with heart failure and stage 1 through stage 4 chronic kidney disease, or unspecified chronic kidney disease: Secondary | ICD-10-CM | POA: Insufficient documentation

## 2019-12-23 DIAGNOSIS — N183 Chronic kidney disease, stage 3 unspecified: Secondary | ICD-10-CM | POA: Insufficient documentation

## 2019-12-23 MED ORDER — FUROSEMIDE 40 MG PO TABS: 40.0000 mg | ORAL_TABLET | Freq: Every day | ORAL | 11 refills | Status: DC

## 2019-12-23 MED ORDER — ATORVASTATIN CALCIUM 40 MG PO TABS: 40.0000 mg | ORAL_TABLET | Freq: Every day | ORAL | 11 refills | Status: DC

## 2019-12-23 MED ORDER — SPIRONOLACTONE 25 MG PO TABS: 25.0000 mg | ORAL_TABLET | Freq: Every day | ORAL | 11 refills | Status: DC

## 2019-12-23 MED ORDER — CARVEDILOL 12.5 MG PO TABS: 12.5000 mg | ORAL_TABLET | Freq: Two times a day (BID) | ORAL | 11 refills | Status: DC

## 2019-12-23 MED FILL — FUROSEMIDE 40 MG TAB: 40 | 30 days supply | Qty: 30 | Fill #0

## 2019-12-23 MED FILL — CARVEDILOL 12.5 MG TABLET: 12.5 | 30 days supply | Qty: 60 | Fill #0

## 2019-12-23 MED FILL — ATORVASTATIN 40 MG TABLET: 40 | 30 days supply | Qty: 30 | Fill #0

## 2019-12-23 MED FILL — SPIRONOLACTONE 25 MG TABS: 25 | 30 days supply | Qty: 30 | Fill #0

## 2019-12-23 NOTE — Patient Instructions (Signed)
It was a pleasure seeing you today!  MEDICATIONS: -We are changing your medications today -Increase spironolactone to 25 mg (1 tablet) daily. -Call if you have questions about your medications.  NEXT APPOINTMENT: Return to clinic in 1 week with Dr. Shirlee Latch.  In general, to take care of your heart failure: -Limit your fluid intake to 2 Liters (half-gallon) per day.   -Limit your salt intake to ideally 2-3 grams (2000-3000 mg) per day. -Weigh yourself daily and record, and bring that "weight diary" to your next appointment.  (Weight gain of 2-3 pounds in 1 day typically means fluid weight.) -The medications for your heart are to help your heart and help you live longer.   -Please contact us before stopping any of your heart medications.  Call the clinic at [phone] with questions or to reschedule future appointments.

## 2019-12-29 ENCOUNTER — Telehealth: Payer: Self-pay | Admitting: Physician Assistant

## 2019-12-29 NOTE — Telephone Encounter (Signed)
Patient has been trying to get permanent disability for his work. He has gotten a Physicist, medical from [place] in the past but the letter denied the claim. 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 He needs to be put on permanent disability. He would like our office to please help the patient with this process as best we can.

## 2019-12-29 NOTE — Telephone Encounter (Signed)
If EF does not improve with meds, likely will be candidate for long-term disability

## 2019-12-29 NOTE — Telephone Encounter (Signed)
I am not sure at this point if we are able to definitely say he will be permanently disabled.  As he is followed in the advanced heart failure clinic, Dr. Shirlee Latch can help [place] with this determination.  He has follow up with him on 01/02/2020.  I will fwd this note to him as well. Tereso Newcomer, PA-C    12/29/2019 4:38 PM

## 2020-01-02 ENCOUNTER — Ambulatory Visit (HOSPITAL_COMMUNITY)
Admission: RE | Admit: 2020-01-02 | Discharge: 2020-01-02 | Disposition: A | Discharge status: Home / Self Care | Payer: Self-pay | Source: Ambulatory Visit | Attending: Cardiology | Admitting: Cardiology

## 2020-01-02 ENCOUNTER — Other Ambulatory Visit: Payer: Self-pay

## 2020-01-02 ENCOUNTER — Encounter (HOSPITAL_COMMUNITY): Payer: Self-pay | Admitting: Cardiology

## 2020-01-02 VITALS — BP 102/80 | HR 80 | Wt 352.2 lb

## 2020-01-02 DIAGNOSIS — I130 Hypertensive heart and chronic kidney disease with heart failure and stage 1 through stage 4 chronic kidney disease, or unspecified chronic kidney disease: Secondary | ICD-10-CM | POA: Insufficient documentation

## 2020-01-02 DIAGNOSIS — I5022 Chronic systolic (congestive) heart failure: Secondary | ICD-10-CM | POA: Insufficient documentation

## 2020-01-02 DIAGNOSIS — Z7982 Long term (current) use of aspirin: Secondary | ICD-10-CM | POA: Insufficient documentation

## 2020-01-02 DIAGNOSIS — Z7901 Long term (current) use of anticoagulants: Secondary | ICD-10-CM | POA: Insufficient documentation

## 2020-01-02 DIAGNOSIS — E1122 Type 2 diabetes mellitus with diabetic chronic kidney disease: Secondary | ICD-10-CM | POA: Insufficient documentation

## 2020-01-02 DIAGNOSIS — G4733 Obstructive sleep apnea (adult) (pediatric): Secondary | ICD-10-CM | POA: Insufficient documentation

## 2020-01-02 DIAGNOSIS — I428 Other cardiomyopathies: Secondary | ICD-10-CM | POA: Insufficient documentation

## 2020-01-02 DIAGNOSIS — N183 Chronic kidney disease, stage 3 unspecified: Secondary | ICD-10-CM | POA: Insufficient documentation

## 2020-01-02 DIAGNOSIS — I2510 Atherosclerotic heart disease of native coronary artery without angina pectoris: Secondary | ICD-10-CM | POA: Insufficient documentation

## 2020-01-02 DIAGNOSIS — Z79899 Other long term (current) drug therapy: Secondary | ICD-10-CM | POA: Insufficient documentation

## 2020-01-02 DIAGNOSIS — E785 Hyperlipidemia, unspecified: Secondary | ICD-10-CM | POA: Insufficient documentation

## 2020-01-02 DIAGNOSIS — Z7984 Long term (current) use of oral hypoglycemic drugs: Secondary | ICD-10-CM | POA: Insufficient documentation

## 2020-01-02 DIAGNOSIS — Z86718 Personal history of other venous thrombosis and embolism: Secondary | ICD-10-CM | POA: Insufficient documentation

## 2020-01-02 LAB — BASIC METABOLIC PANEL
Anion gap: 9 (ref 5–15)
BUN: 12 mg/dL (ref 6–20)
CO2: 27 mmol/L (ref 22–32)
Calcium: 9.6 mg/dL (ref 8.9–10.3)
Chloride: 102 mmol/L (ref 98–111)
Creatinine, Ser: 1.18 mg/dL (ref 0.61–1.24)
GFR calc Af Amer: >60 mL/min (ref >60)
GFR calc non Af Amer: >60 mL/min (ref >60)
Glucose, Bld: 161 mg/dL — ABNORMAL HIGH (ref 70–99)
Potassium: 4.9 mmol/L (ref 3.5–5.1)
Sodium: 138 mmol/L (ref 135–145)

## 2020-01-02 MED ORDER — DAPAGLIFLOZIN PROPANEDIOL 10 MG PO TABS: 10.0000 mg | ORAL_TABLET | Freq: Every day | ORAL | 5 refills | Status: DC

## 2020-01-02 NOTE — Patient Instructions (Signed)
START Farxiga 10mg  (1 tab) daily    Labs today and repeat in 10 days We will only contact you if something comes back abnormal or we need to make some changes. Otherwise no news is good news!   Your physician has requested that you have an echocardiogram. Echocardiography is a painless test that uses sound waves to create images of your heart. It provides your doctor with information about the size and shape of your heart and how well your heart's chambers and valves are working. This procedure takes approximately one hour. There are no restrictions for this procedure.   Your physician recommends that you schedule a follow-up appointment in: 3 weeks with the pharmacy and 2 months with Dr with an ECHO   Please call office at [phone] option 2 if you have any questions or concerns.   At the [facility], you and your health needs are our priority. As part of our continuing mission to provide you with exceptional heart care, we have created designated Provider Care Teams. These Care Teams include your primary Cardiologist (physician) and Advanced Practice Providers (APPs- Physician Assistants and Nurse Practitioners) who all work together to provide you with the care you need, when you need it.   You may see any of the following providers on your designated Care Team at your next follow up: [phone] Dr Marland Kitchen . Dr Arvilla Meres . Marca Ancona, NP . Tonye Becket, PA . Robbie Lis, PharmD   Please be sure to bring in all your medications bottles to every appointment.

## 2020-01-04 NOTE — Progress Notes (Signed)
PCP: Caffie Damme, MD HF Cardiology: Dr. Shirlee Latch  49 y.o. with history of HTN, DM, OSA on CPAP, and nonischemic cardiomyopathy was referred by Tereso Newcomer for evaluation of CHF.  Patient was admitted in 11/20 with dyspnea and leg swelling. He was found to have CHF.  Echo in 11/20 showed EF 20-25%.  LHC/RHC showed elevated filling pressures, CI 2.0, and mild nonobstructive CAD. He was started on a regimen of cardiac meds.  Cardiac MRI in 1/21 showed EF still low at 21% with severely dilated LV.  There was no late gadolinium enhancement.    He is doing well symptomatically.  No significant exertional dyspnea.  He walks for about an hour a day.  No problems walking up stairs.  No chest pain. No lightheadedness.  No orthopnea/PND.  Weight up 4 lbs.     Labs (11/20): K 5.1, creatinine 1.56 Labs (1/21): creatinine 1.38 Labs (3/21): K 4.6, creatinine 1.22  ECG (personally reviewed): NSR, LVH  PMH: 1. Type 2 diabetes 2. HTN 3. Hyperlipidemia 4. OSA: Uses CPAP.  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  6. CKD stage 3 7. Chronic systolic CHF: Nonischemic cardiomyopathy.  - Echo (11/20): EF 20-25%, mild-moderate MR.  - LHC (11/20): Nonobstructive CAD; mean RA 10, PA 60/30 mean 40, mean PCWP 27, CI 2.0.  - Cardiac MRI (1/21): Severe LV enlargement with EF 21%, global hypokinesis, RV EF 33%, no LGE.   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  No ETOH, no smoking.   FH: Mother with "heart issues," died when he was born.  No other known heart disease.   ROS: All systems reviewed and negative except as per HPI.   Current Outpatient Medications  Medication Sig Dispense Refill  . aspirin 81 MG chewable tablet Chew 81 mg by mouth daily.    Marland Kitchen atorvastatin (LIPITOR) 40 MG tablet Take 1 tablet (40 mg total) by mouth at bedtime. 30 tablet 11  . carvedilol (COREG) 12.5 MG tablet Take 1 tablet (12.5 mg total) by mouth 2 (two) times daily. 60 tablet 11  .  Dulaglutide (TRULICITY) 1.5 MG/0.5ML SOPN Inject 1.5 mg into the skin once a week.    . ferrous sulfate 325 (65 FE) MG tablet Take 325 mg by mouth daily with breakfast.    . furosemide (LASIX) 40 MG tablet Take 1 tablet (40 mg total) by mouth daily. 30 tablet 11  . glipiZIDE (GLUCOTROL XL) 10 MG 24 hr tablet Take 10 mg by mouth daily with breakfast.    . metFORMIN (GLUCOPHAGE) 1000 MG tablet Take 1,000 mg by mouth 2 (two) times daily with a meal.     . Multiple Vitamin (MULTIVITAMIN WITH MINERALS) TABS tablet Take 1 tablet by mouth daily.    . sacubitril-valsartan (ENTRESTO) 49-51 MG Take 1 tablet by mouth 2 (two) times daily. 60 tablet 5  . spironolactone (ALDACTONE) 25 MG tablet Take 1 tablet (25 mg total) by mouth daily. 30 tablet 11  . dapagliflozin propanediol (FARXIGA) 10 MG TABS tablet Take 10 mg by mouth daily before breakfast. 30 tablet 5  . Vitamin D, Ergocalciferol, (DRISDOL) 1.25 MG (50000 UT) CAPS capsule Take 50,000 Units by mouth every 7 (seven) days.     No current facility-administered medications for this encounter.   BP 102/80   Pulse 80   Wt (!) 159.8 kg (352 lb 3.2 oz)   SpO2 99%   BMI 49.12 kg/m  General: NAD  Neck: No JVD, no thyromegaly or thyroid nodule.  Lungs: Clear to auscultation bilaterally with normal respiratory effort. CV: Nondisplaced PMI.  Heart regular S1/S2, no S3/S4, no murmur.  No peripheral edema.  No carotid bruit.  Normal pedal pulses.  Abdomen: Soft, nontender, no hepatosplenomegaly, no distention.  Skin: Intact without lesions or rashes.  Neurologic: Alert and oriented x 3.  Psych: Normal affect. Extremities: No clubbing or cyanosis.  HEENT: Normal.   Assessment/Plan: 1. Chronic systolic CHF:  Nonischemic cardiomyopathy. No definite family history of cardiomyopathy.  No ETOH or drugs. Cath with nonobstructive disease in 11/20.  Most recent study was a cardiac MRI in 1/21 with EF 21% and severe LV dilation. Possible viral myocarditis versus  diabetic cardiomyopathy.  BP does not seem to have been markedly high in the past so doubt that HTN explains CMP.  On exam today, he is euvolemic.  NYHA class I-II symptoms.  - Continue Entresto 49/51 bid.   - Continue Coreg 12.5 mg bid.  - Continue spironolactone 25 mg daily.  - He does not have a lot of BP room for medication titration, I will start him on dapagliflozin 10 mg daily.  BMET today and in 10 days.  - Repeat echo at followup with me in 6/21.  If EF remains low, given young age (despite nonischemic cardiomyopathy), would consider ICD.  He has a narrow QRS, not candidate for CRT.  2. CKD: Stage 3, likely due to DM and HTN.   - BMET today.  3. HTN: BP is controlled.  4. OSA: Continue to use CPAP.  5. Type 2 diabetes: As above, starting dapagliflozin.   Followup with HF pharmacist in 3 wks for medication titration, see me in 6/21.   Loralie Champagne 01/04/2020

## 2020-01-12 ENCOUNTER — Ambulatory Visit (HOSPITAL_COMMUNITY)
Admission: RE | Admit: 2020-01-12 | Discharge: 2020-01-12 | Disposition: A | Discharge status: Home / Self Care | Payer: Self-pay | Source: Ambulatory Visit | Attending: Cardiology | Admitting: Cardiology

## 2020-01-12 ENCOUNTER — Other Ambulatory Visit: Payer: Self-pay

## 2020-01-12 DIAGNOSIS — I5022 Chronic systolic (congestive) heart failure: Secondary | ICD-10-CM | POA: Insufficient documentation

## 2020-01-12 LAB — BASIC METABOLIC PANEL
Anion gap: 8 (ref 5–15)
BUN: 13 mg/dL (ref 6–20)
CO2: 29 mmol/L (ref 22–32)
Calcium: 9.6 mg/dL (ref 8.9–10.3)
Chloride: 101 mmol/L (ref 98–111)
Creatinine, Ser: 1.42 mg/dL — ABNORMAL HIGH (ref 0.61–1.24)
GFR calc Af Amer: >60 mL/min (ref >60)
GFR calc non Af Amer: 58 mL/min — ABNORMAL LOW (ref >60)
Glucose, Bld: 191 mg/dL — ABNORMAL HIGH (ref 70–99)
Potassium: 4.7 mmol/L (ref 3.5–5.1)
Sodium: 138 mmol/L (ref 135–145)

## 2020-01-12 NOTE — Addendum Note (Signed)
Encounter addended by: Modesta Messing, CMA on: 01/12/2020 8:40 AM  Actions taken: Care Teams modified

## 2020-01-27 ENCOUNTER — Other Ambulatory Visit: Payer: Self-pay

## 2020-01-27 ENCOUNTER — Other Ambulatory Visit (HOSPITAL_COMMUNITY): Payer: Self-pay | Admitting: Cardiology

## 2020-01-27 ENCOUNTER — Ambulatory Visit (HOSPITAL_COMMUNITY)
Admission: RE | Admit: 2020-01-27 | Discharge: 2020-01-27 | Disposition: A | Discharge status: Home / Self Care | Payer: Medicaid Other | Source: Ambulatory Visit | Attending: Cardiology | Admitting: Cardiology

## 2020-01-27 VITALS — BP 110/64 | HR 77 | Ht 71.0 in | Wt 348.8 lb

## 2020-01-27 DIAGNOSIS — I5022 Chronic systolic (congestive) heart failure: Secondary | ICD-10-CM

## 2020-01-27 DIAGNOSIS — E1122 Type 2 diabetes mellitus with diabetic chronic kidney disease: Secondary | ICD-10-CM | POA: Insufficient documentation

## 2020-01-27 DIAGNOSIS — Z7901 Long term (current) use of anticoagulants: Secondary | ICD-10-CM | POA: Insufficient documentation

## 2020-01-27 DIAGNOSIS — G4733 Obstructive sleep apnea (adult) (pediatric): Secondary | ICD-10-CM | POA: Insufficient documentation

## 2020-01-27 DIAGNOSIS — Z79899 Other long term (current) drug therapy: Secondary | ICD-10-CM | POA: Insufficient documentation

## 2020-01-27 DIAGNOSIS — I130 Hypertensive heart and chronic kidney disease with heart failure and stage 1 through stage 4 chronic kidney disease, or unspecified chronic kidney disease: Secondary | ICD-10-CM | POA: Insufficient documentation

## 2020-01-27 DIAGNOSIS — I428 Other cardiomyopathies: Secondary | ICD-10-CM | POA: Insufficient documentation

## 2020-01-27 DIAGNOSIS — N183 Chronic kidney disease, stage 3 unspecified: Secondary | ICD-10-CM | POA: Insufficient documentation

## 2020-01-27 MED ORDER — DAPAGLIFLOZIN PROPANEDIOL 10 MG PO TABS: 10.0000 mg | ORAL_TABLET | Freq: Every day | ORAL | 11 refills | Status: DC

## 2020-01-27 MED ORDER — CARVEDILOL 25 MG PO TABS: 25.0000 mg | ORAL_TABLET | Freq: Two times a day (BID) | ORAL | 11 refills | Status: DC

## 2020-01-27 NOTE — Patient Instructions (Signed)
It was a pleasure seeing you today!  MEDICATIONS: -We are changing your medications today -Increase carvedilol to 25mg  (1 tablet twice daily). You may take 2 tablets of the 12.5mg  tablets twice daily until you pick up the new prescription for the mg tablets  -We sent your dapagliflozin ) refill to the [facility] pharmacy. This should be covered under the heart failure fund.  -Call if you have questions about your medications.  NEXT APPOINTMENT: Return to clinic in 2 months with Dr. [facility].  In general, to take care of your heart failure: -Limit your fluid intake to 2 Liters (half-gallon) per day.   -Limit your salt intake to ideally 2-3 grams (2000-3000 mg) per day. -Weigh yourself daily and record, and bring that "weight diary" to your next appointment.  (Weight gain of 2-3 pounds in 1 day typically means fluid weight.) -The medications for your heart are to help your heart and help you live longer.   -Please contact Shirlee Latch before stopping any of your heart medications.  Call the clinic at [phone] with questions or to reschedule future appointments.

## 2020-01-27 NOTE — Progress Notes (Signed)
PCP: Glendon Axe, MD HF Cardiology: Dr. Aundra Dubin  HPI: 49 y.o. with history of HTN, DM, OSA on CPAP, and nonischemic cardiomyopathy was referred by Richardson Dopp for evaluation of CHF. Patient was admitted in 11/20 with dyspnea and leg swelling. He was found to have CHF. Echo in 11/20 showed EF 20-25%. LHC/RHC showed elevated filling pressures, CI 2.0, and mild nonobstructive CAD. He was started on a regimen of cardiac meds. Cardiac MRI in 1/21 showed EF still low at 21% with severely dilated LV. There was no late gadolinium enhancement.    At last HF clinic visit with Dr. Aundra Dubin on 01/02/20 he reported doing well symptomatically. No significant exertional dyspnea.  He was able to walk for about an hour a day. No problems walking up stairs. No chest pain. No lightheadedness. No orthopnea/PND. Weight was up 4 lbs.     Today he returns to HF clinic for pharmacist medication titration. At last visit with MD, dapagliflozin 10 mg daily was initiated. Symptomatically, he is doing well. Denies dizziness, lightheadedness, and fatigue. No chest pain or palpitations. No shortness of breath. He is able to complete all his ADLs and has been increasing his activity level. He is now walking 2 miles per day which includes inclines. He weighs himself daily at home (normal range is 338-341 lbs - he did note this is after exercise and w/o shoes). He takes furosemide 40 mg daily. No LEE or PND/Orthopnea. His appetite is normal and he has recently been trying to improve his diet to control his diabetes. He has decreased from eating three times per day to two times per day. He adheres to a low salt diet.  HF Medications: Carvedilol 12.5 mg twice daily Entresto 49/51 mg twice daily Spironolactone 25 mg daily Dapagliflozin 10 mg daily Furosemide 40 mg daily  Has the patient been experiencing any side effects to the medications prescribed?  no  Does the patient have any problems obtaining medications due to  transportation or finances?  Yes - no prescription insurance. Obtains Entresto through [employer] patient assistance program. He gets carvedilol, spironolactone, dapagliflozin, furosemide and atorvastatin at [facility] through the [facility].  Understanding of regimen: fair Understanding of indications: fair Potential of compliance: good Patient understands to avoid NSAIDs. Patient understands to avoid decongestants.   Pertinent Lab Values (01/12/20): Marland Kitchen Serum creatinine 1.42, BUN 13, Potassium 4.7, Sodium 138, BNP 549 (07/31/2019)  Vital Signs: . Weight: 348.8 lbs (last clinic weight: 352 lbs) . Blood pressure: 110/64  . Heart rate: 77   Assessment: 1. Chronic systolic CHF: Nonischemic cardiomyopathy. No definite family history of cardiomyopathy. No ETOH or drugs. Cath with nonobstructive disease in 11/20.  Most recent study was a cardiac MRI in 1/21 with EF 21% and severe LV dilation. Possible viral myocarditis versus diabetic cardiomyopathy. BP does not seem to have been markedly high in the past so doubt that HTN explains CMP. -On exam today, he is euvolemic. NYHA class I-II symptoms.  - Vitals: BP 110/64, HR 77 - Continue furosemide 40 mg daily - Increase carvedilol to 25 mg BID - Continue Entresto 49/51 BID - Continue spironolactone 25 mg daily - Continue dapagliflozin 10 mg daily - Repeat echo at followup with Dr. Aundra Dubin in 02/2020. If EF remains low, given young age (despite nonischemic cardiomyopathy), would consider ICD. He has a narrow QRS, not candidate for CRT.   2. CKD: Stage 3, likely due to DM and HTN.   - Dapagliflozin as above  3.  HTN: BP is controlled.   4. OSA: Continue to use CPAP.   5. Type 2 diabetes: Per PCP - Last A1c 9.7 (08/01/2019) - Continue metformin, glipizide, Trulicity, and dapagliflozin   Plan: 1) Medication changes: Based on clinical presentation, vital signs and recent labs will increase carvedilol to 25 mg BID 2)  Labs: Scr 1.42, K 4.7 (01/12/2020) 3) Follow-up: 2 months with Dr. Jacalyn Lefevre, PharmD PGY1 Ambulatory Care Pharmacy Resident  Karle Plumber, PharmD, BCPS, [facility], CPP Heart Failure Clinic Pharmacist [phone]

## 2020-02-24 ENCOUNTER — Telehealth: Payer: Self-pay | Admitting: Physician Assistant

## 2020-02-24 NOTE — Telephone Encounter (Signed)
FMLA/disability form received from [employer]. Placed in box for Tereso Newcomer, [facility] to review. 02/24/20 vlm

## 2020-02-28 IMAGING — CT CT ANGIO CHEST
2 of 6 series · 17 of 46 positions shown · IV contrast (omnipaque)
Comparison: Radiograph earlier this day.

CLINICAL DATA: PE suspected, high pretest prob. Shortness of
breath.

EXAM:
CT ANGIOGRAPHY CHEST WITH CONTRAST
TECHNIQUE: Multidetector CT imaging of the chest was performed using the
standard protocol during bolus administration of intravenous
contrast. Multiplanar CT image reconstructions and MIPs were
obtained to evaluate the vascular anatomy.
CONTRAST:  100mL OMNIPAQUE IOHEXOL 350 MG/ML SOLN

[Series 6: thins · axial · 0.81mm/px · z∈[-293,-41]mm · 14 of 278 slices shown]
[im 13/278  lung]
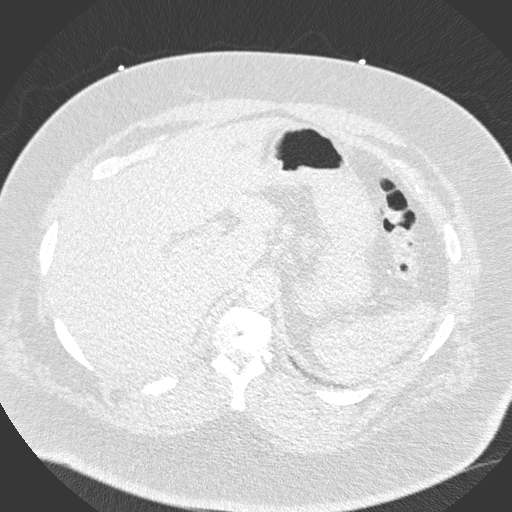
[im 37/278  soft-tissue]
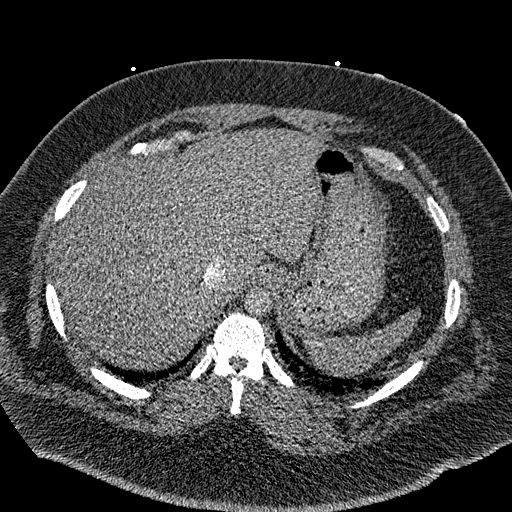
[im 49/278  lung]
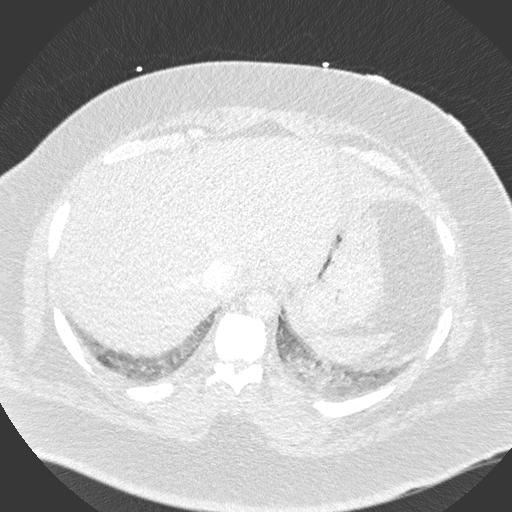
[im 73/278  soft-tissue]
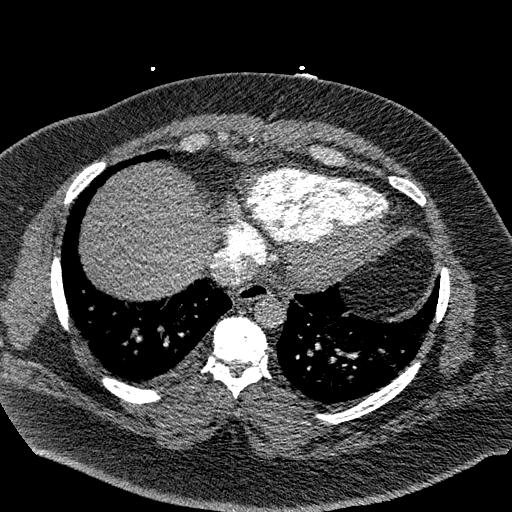
[im 97/278  lung]
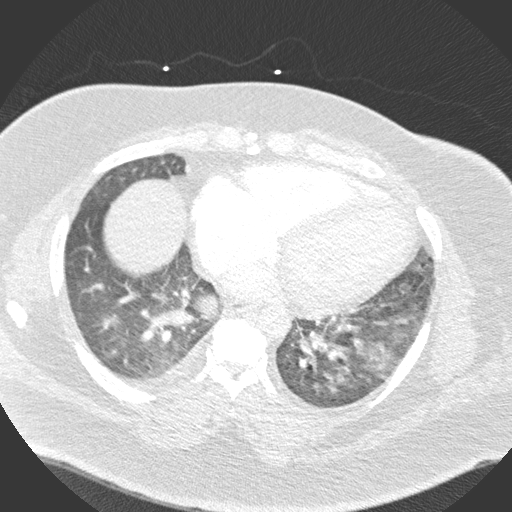
[im 109/278  soft-tissue]
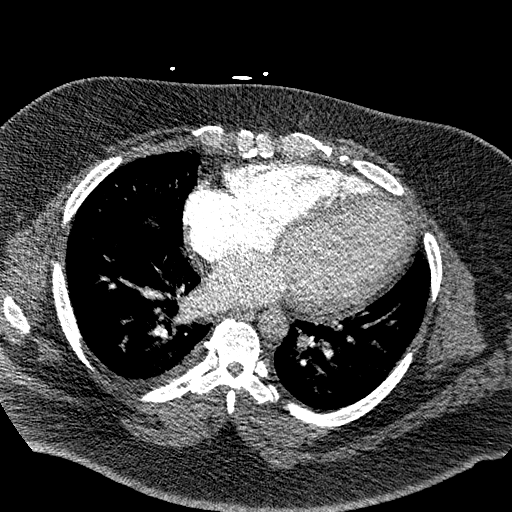
[im 133/278  lung]
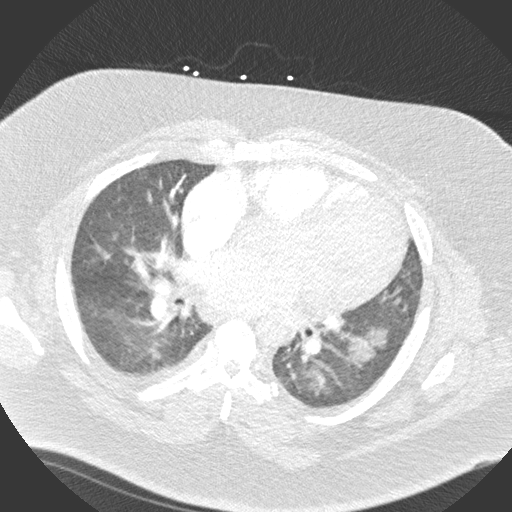
[im 145/278  soft-tissue]
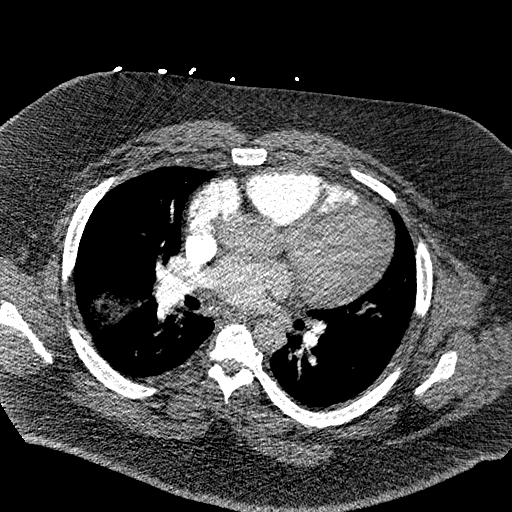
[im 169/278  lung]
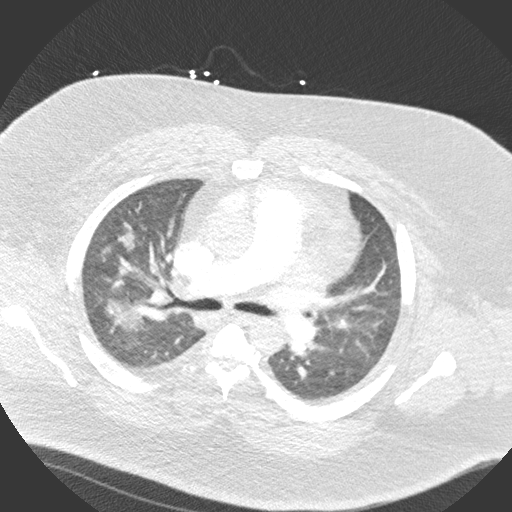
[im 181/278  soft-tissue]
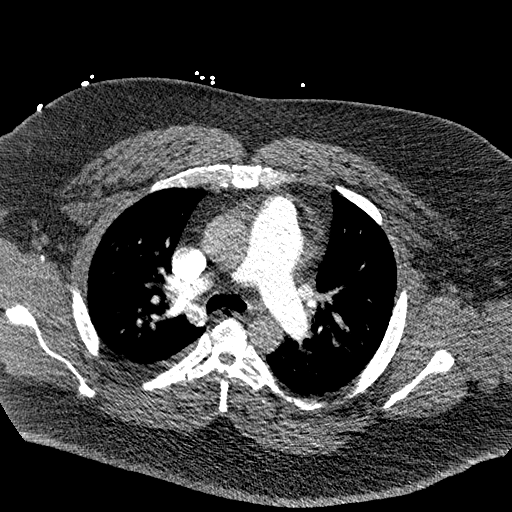
[im 205/278  lung]
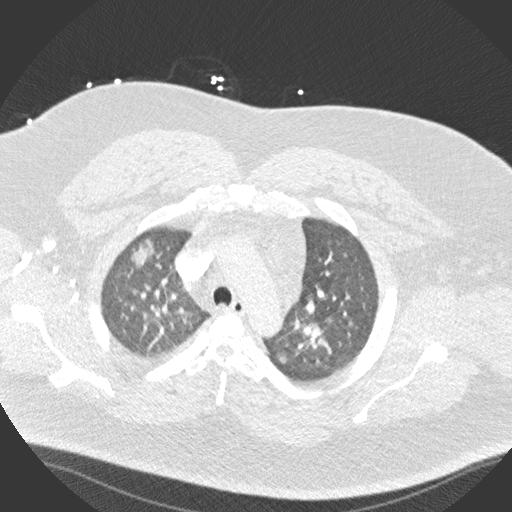
[im 229/278  soft-tissue]
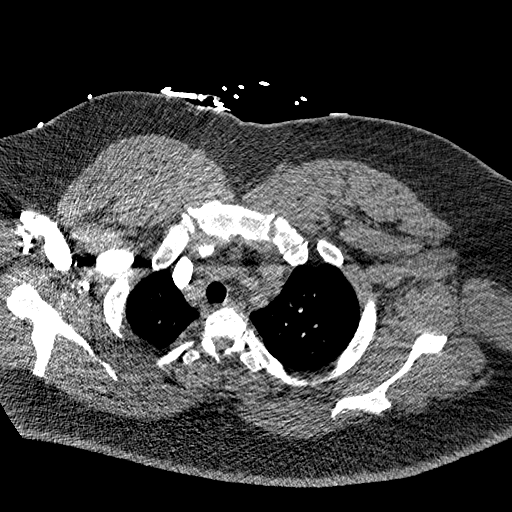
[im 241/278  lung]
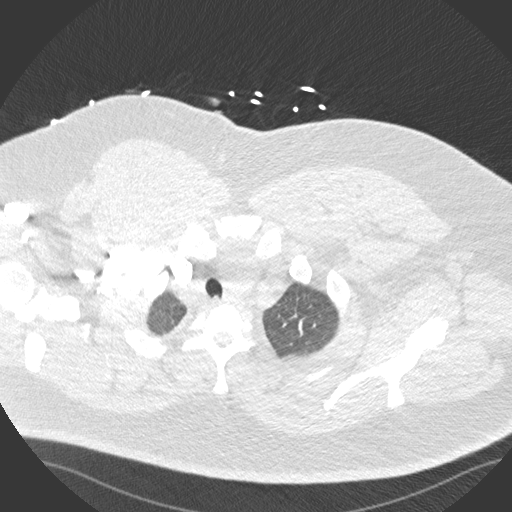
[im 265/278  soft-tissue]
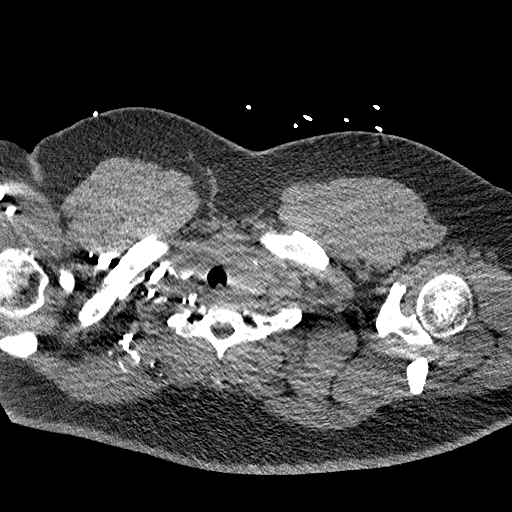

[Series 8: coronal mpr · coronal · 0.59mm/px · 3 of 151 slices shown]
[im 38/151  soft-tissue]
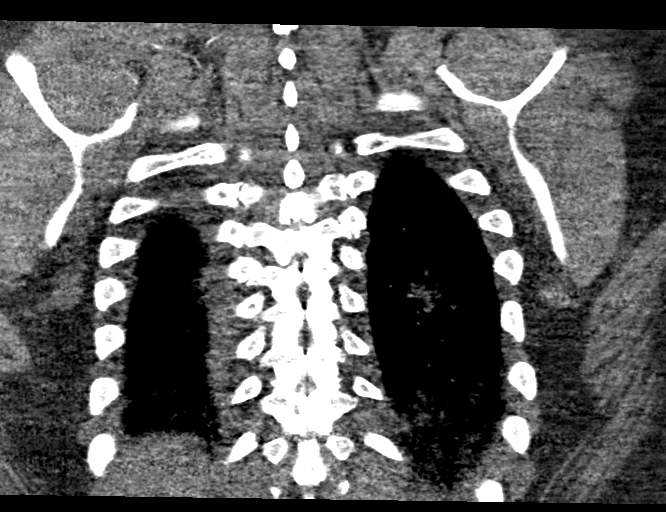
[im 76/151  soft-tissue]
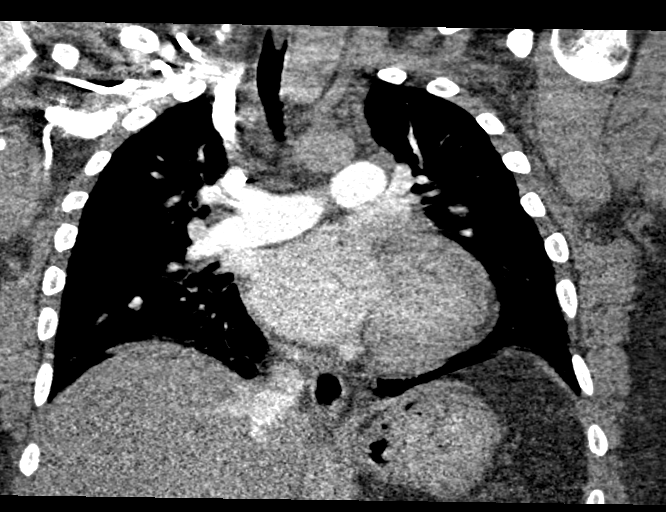
[im 113/151  soft-tissue]
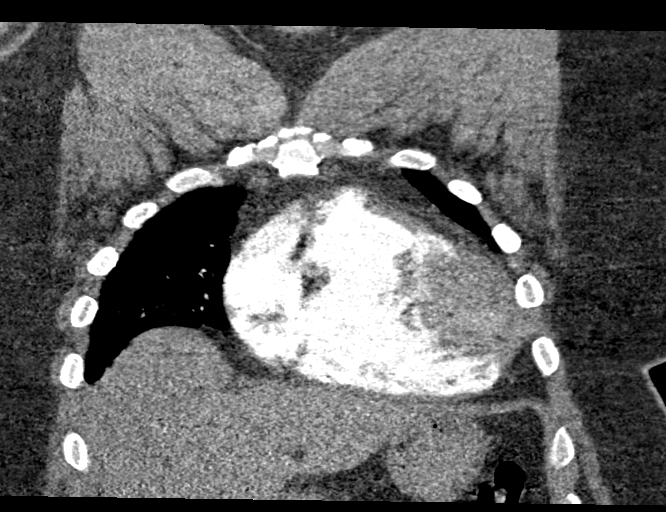

[17 of 46 positions shown; findings below may reference images not displayed]

FINDINGS: Cardiovascular: There are no filling defects within the central
pulmonary arteries to suggest pulmonary embolus. Evaluation is
diagnostic to the proximal segmental level. Distal segmental and
subsegmental branches cannot be assessed due to contrast bolus
timing and soft tissue attenuation from habitus. Mild multi chamber
cardiomegaly. Minimal contrast refluxes into the IVC and hepatic
veins. Thoracic aorta is normal in caliber. Cannot assess for
dissection given phase of contrast tailored to pulmonary artery
evaluation.

Mediastinum/Nodes: Calcified subcarinal lymph nodes. No noncalcified
adenopathy. Heterogeneous enlargement of the thyroid gland
calcifications on the left extending substernal. No dominant nodule,
however evaluation is technically limited. Esophagus is
decompressed.

Lungs/Pleura: Nodular of bilateral ground-glass opacities throughout
both lungs, slightly more prominent on the right. Findings slightly
more prominent in the upper lobes. Mild smooth septal thickening.
Trachea and mainstem bronchi are patent. No solid pulmonary mass.
Small right pleural effusion.

Upper Abdomen: Minimal contrast refluxing into the hepatic veins and
IVC. No other acute finding.

Musculoskeletal: There are no acute or suspicious osseous
abnormalities.

Review of the MIP images confirms the above findings.
IMPRESSION: 1. No central pulmonary embolus to the proximal segmental level.
Distal segmental and subsegmental branches cannot be assessed due to
contrast bolus timing and soft tissue attenuation from habitus.
2. Nodular bilateral ground-glass opacities throughout both lungs,
slightly more prominent in the upper lobes. Findings may be due to
atypical infection (including but not classic for [id number]) or
pulmonary edema. In the setting of septal thickening, cardiomegaly,
and small right pleural effusion, pulmonary edema is favored.
3. Mild multi chamber cardiomegaly. Minimal contrast refluxing into
the IVC and hepatic veins, suggesting elevated right heart
pressures.
4. Heterogeneously enlarged thyroid gland. Left thyroid
calcification. No dominant nodule is seen, however evaluation is
technically limited due to soft tissue attenuation from habitus.
Consider further evaluation with thyroid ultrasound on a nonemergent
basis.

## 2020-02-28 IMAGING — DX DG CHEST 2V
2 series · 2 of 2 positions shown · non-contrast
Comparison: .

CLINICAL DATA: Shortness of breath.

EXAM:
CHEST - 2 VIEW

[chest pa]
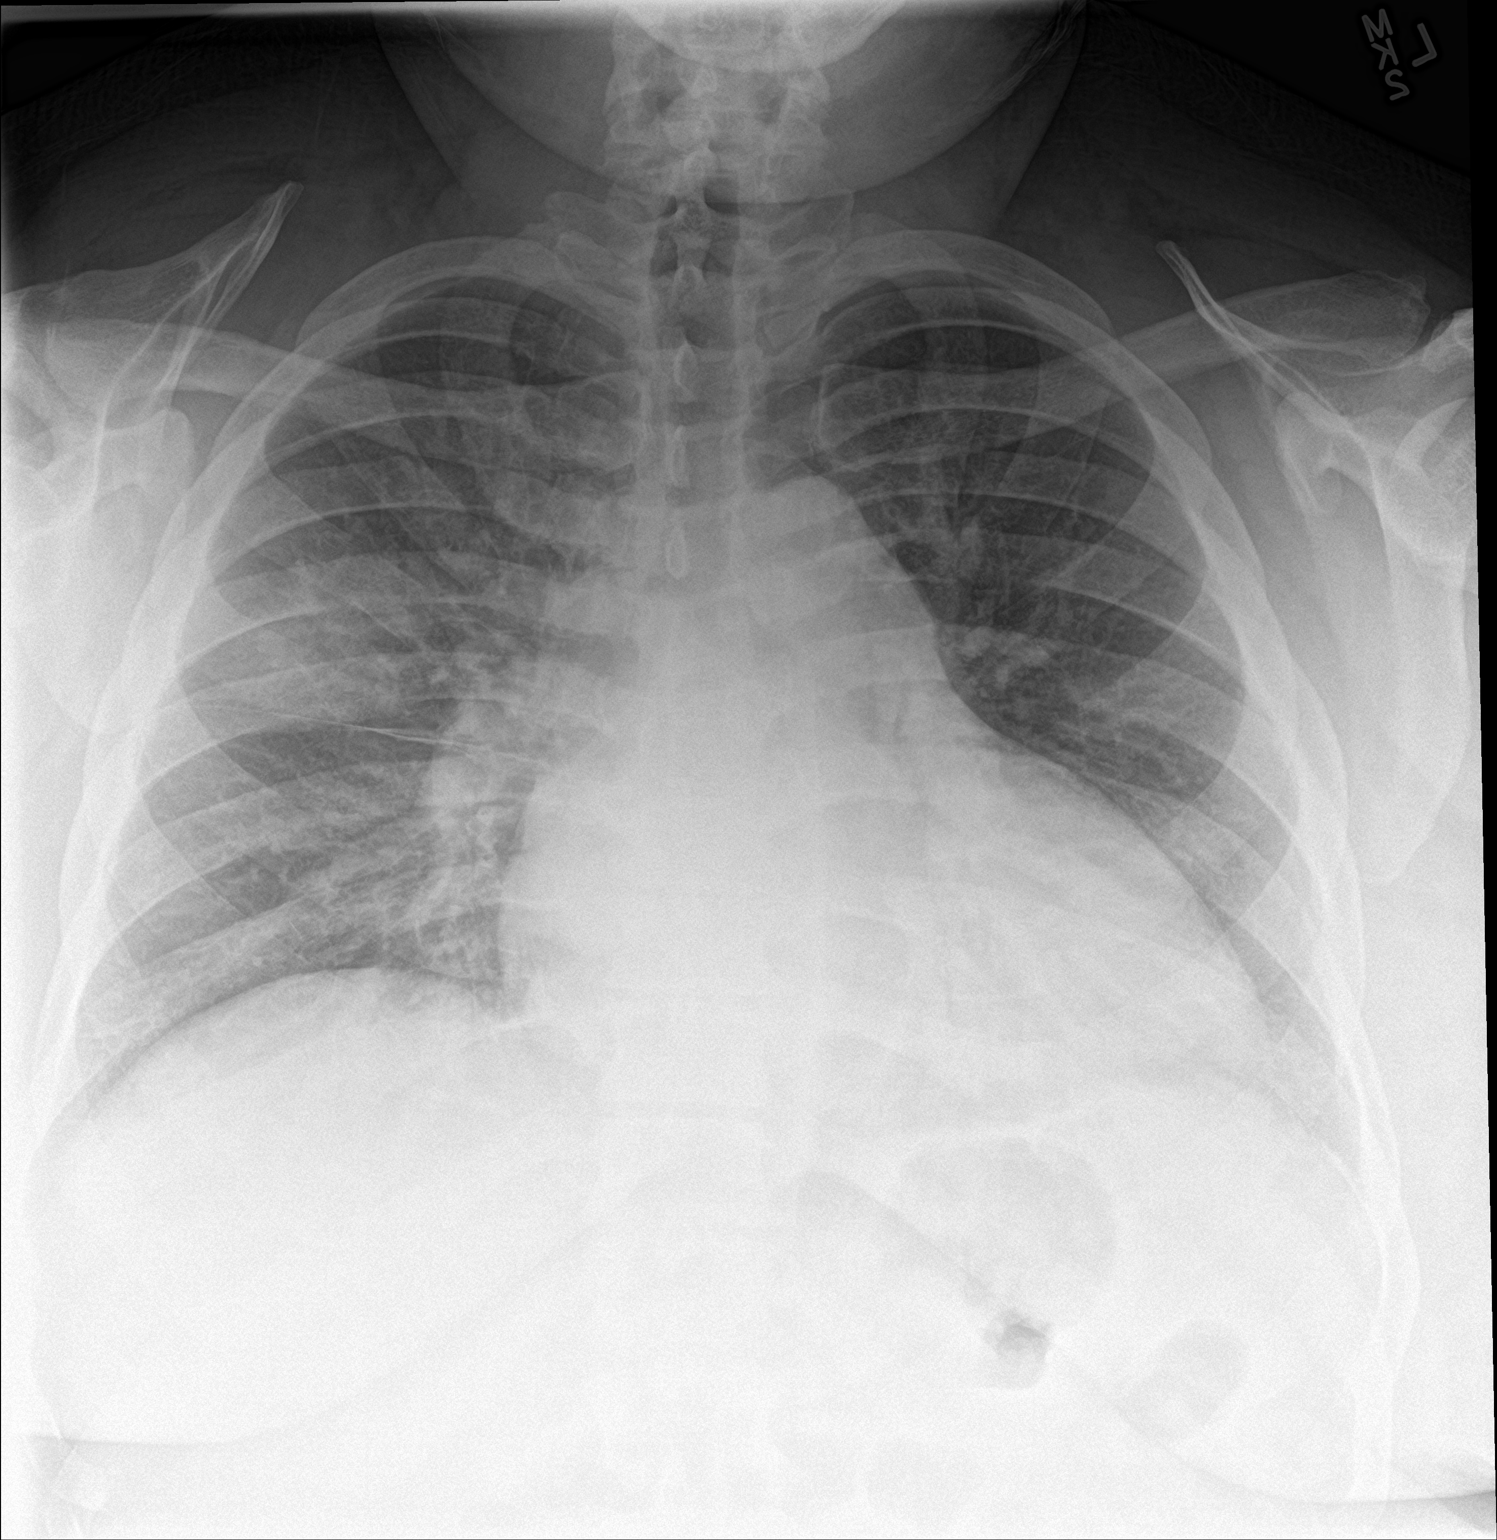

[chest lat]
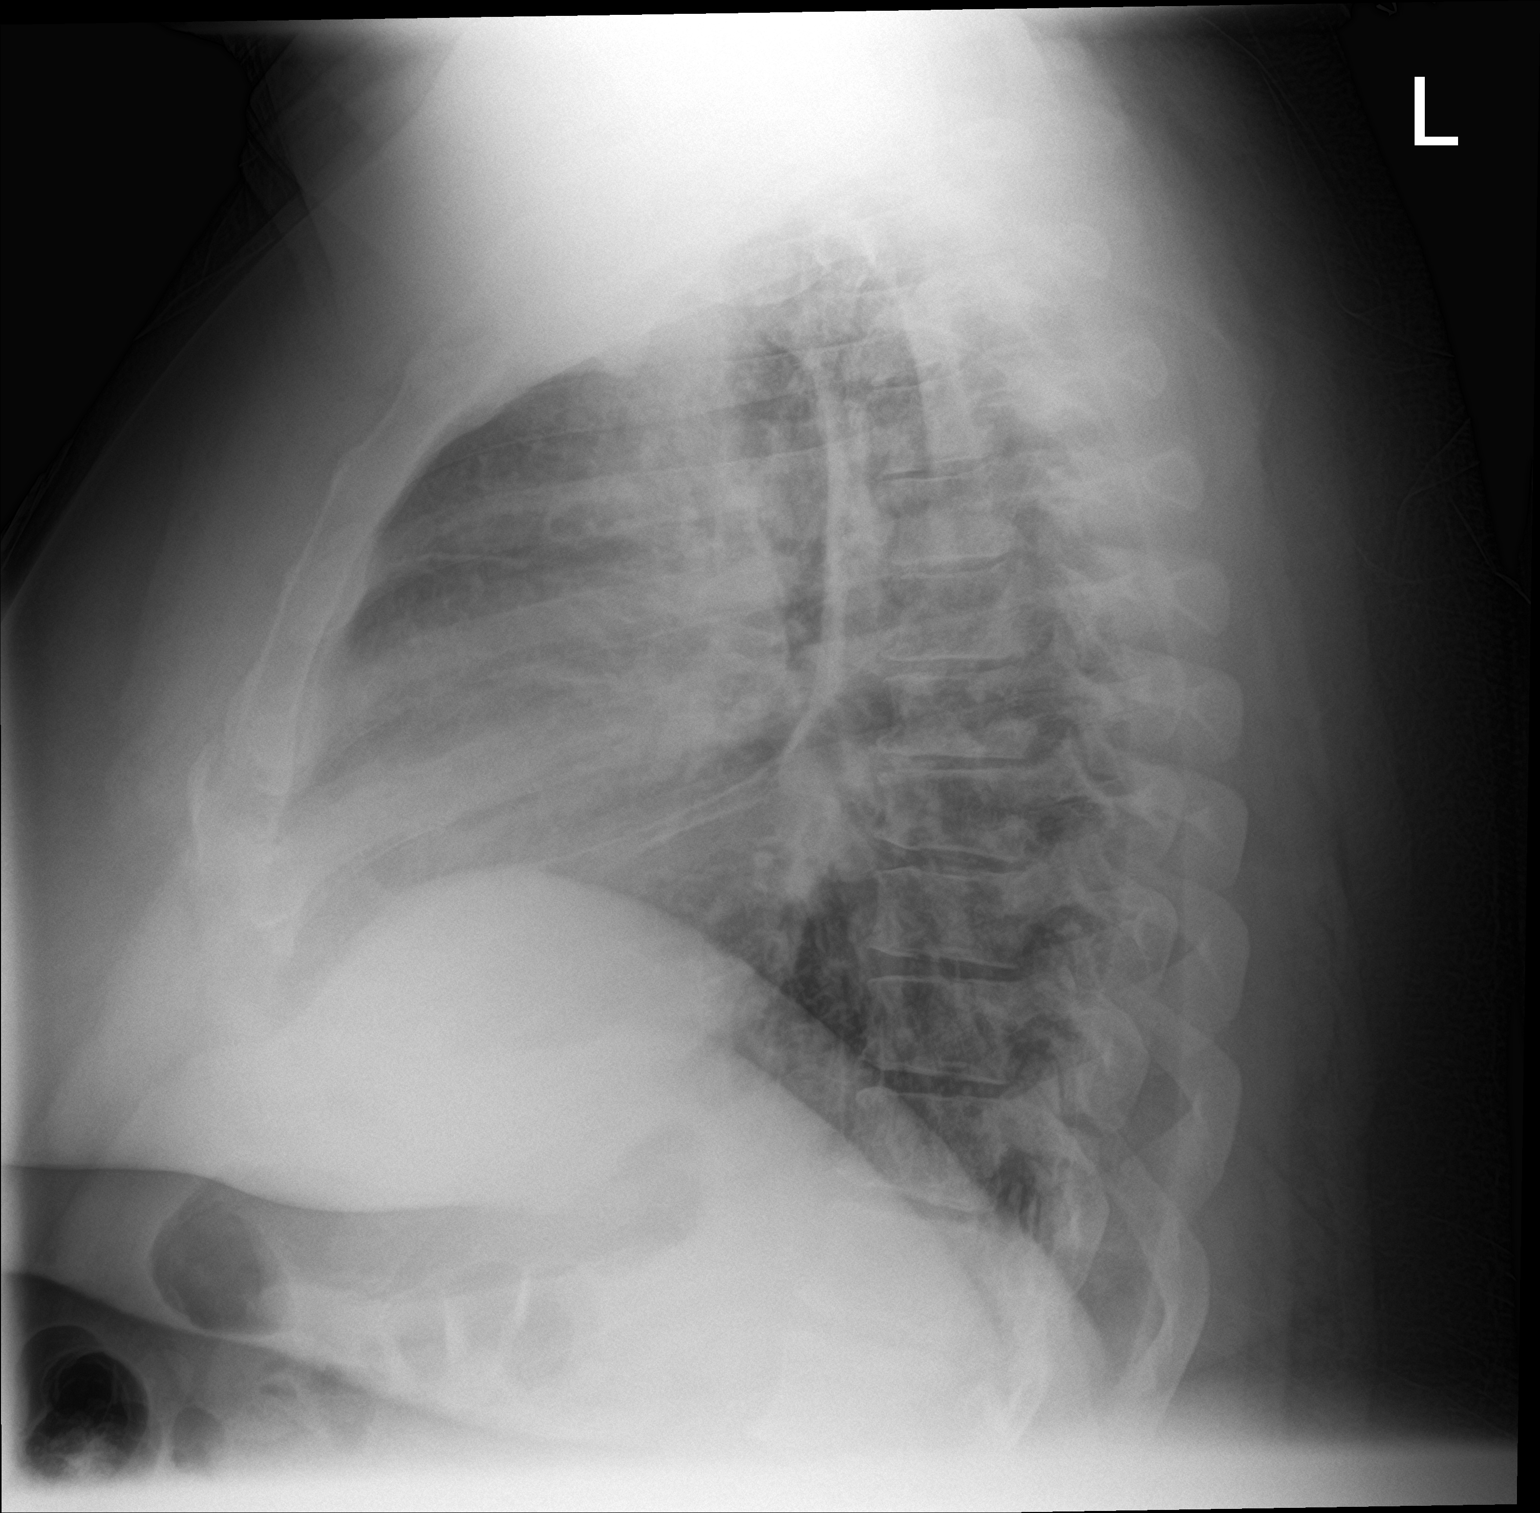

[2 of 2 positions shown; findings below may reference images not displayed]

FINDINGS: Cardiomegaly. Mild pulmonary venous congestion bilateral
interstitial prominence noted. CHF could present in this fashion.
Right upper lobe alveolar infiltrate. This could represent focal
pulmonary edema and/or pneumonia. No pleural effusion or
pneumothorax. Degenerative change thoracic spine.
IMPRESSION: 1. Cardiomegaly with pulmonary venous congestion and bilateral
interstitial prominence suggesting CHF.

2. Right upper lobe alveolar infiltrate noted. This could represent
focal edema and or pneumonia.

## 2020-03-02 MED FILL — FARXIGA 10 MG TABLET: 10 | 30 days supply | Qty: 30 | Fill #1

## 2020-03-04 ENCOUNTER — Telehealth (HOSPITAL_COMMUNITY): Payer: Self-pay | Admitting: *Deleted

## 2020-03-04 NOTE — Telephone Encounter (Signed)
Pt called stating he was told to call our office if his systolic bp was less than 95. Pt said his bp has been running about 92. Pt is asymptomatic and recently lost 10lbs. He wants to know if his meds need to be adjusted with his recent weight loss and low bp.  Routed to Dr.McLean for advice

## 2020-03-04 NOTE — Telephone Encounter (Signed)
Do not change unless he is dizzy.

## 2020-03-05 NOTE — Telephone Encounter (Signed)
Pt aware and verbalized understanding.  

## 2020-03-11 MED FILL — SPIRONOLACTONE 25 MG TABS: 25 | 30 days supply | Qty: 30 | Fill #1

## 2020-03-31 ENCOUNTER — Encounter (HOSPITAL_COMMUNITY): Payer: Self-pay | Admitting: Cardiology

## 2020-03-31 ENCOUNTER — Other Ambulatory Visit: Payer: Self-pay

## 2020-03-31 ENCOUNTER — Ambulatory Visit (HOSPITAL_BASED_OUTPATIENT_CLINIC_OR_DEPARTMENT_OTHER)
Admission: RE | Admit: 2020-03-31 | Discharge: 2020-03-31 | Disposition: A | Discharge status: Home / Self Care | Payer: BC Managed Care – PPO | Source: Ambulatory Visit | Attending: Cardiology | Admitting: Cardiology

## 2020-03-31 ENCOUNTER — Ambulatory Visit (HOSPITAL_COMMUNITY)
Admission: RE | Admit: 2020-03-31 | Discharge: 2020-03-31 | Disposition: A | Discharge status: Home / Self Care | Payer: BC Managed Care – PPO | Source: Ambulatory Visit | Attending: Family Medicine | Admitting: Family Medicine

## 2020-03-31 VITALS — BP 120/82 | HR 85 | Wt 337.0 lb

## 2020-03-31 DIAGNOSIS — I130 Hypertensive heart and chronic kidney disease with heart failure and stage 1 through stage 4 chronic kidney disease, or unspecified chronic kidney disease: Secondary | ICD-10-CM | POA: Insufficient documentation

## 2020-03-31 DIAGNOSIS — N183 Chronic kidney disease, stage 3 unspecified: Secondary | ICD-10-CM | POA: Insufficient documentation

## 2020-03-31 DIAGNOSIS — Z86718 Personal history of other venous thrombosis and embolism: Secondary | ICD-10-CM | POA: Insufficient documentation

## 2020-03-31 DIAGNOSIS — Z7901 Long term (current) use of anticoagulants: Secondary | ICD-10-CM | POA: Diagnosis not present

## 2020-03-31 DIAGNOSIS — I2510 Atherosclerotic heart disease of native coronary artery without angina pectoris: Secondary | ICD-10-CM | POA: Insufficient documentation

## 2020-03-31 DIAGNOSIS — Z7984 Long term (current) use of oral hypoglycemic drugs: Secondary | ICD-10-CM | POA: Insufficient documentation

## 2020-03-31 DIAGNOSIS — Z7982 Long term (current) use of aspirin: Secondary | ICD-10-CM | POA: Insufficient documentation

## 2020-03-31 DIAGNOSIS — I5022 Chronic systolic (congestive) heart failure: Secondary | ICD-10-CM | POA: Diagnosis not present

## 2020-03-31 DIAGNOSIS — I509 Heart failure, unspecified: Secondary | ICD-10-CM | POA: Diagnosis not present

## 2020-03-31 DIAGNOSIS — E1122 Type 2 diabetes mellitus with diabetic chronic kidney disease: Secondary | ICD-10-CM | POA: Diagnosis not present

## 2020-03-31 DIAGNOSIS — Z79899 Other long term (current) drug therapy: Secondary | ICD-10-CM | POA: Insufficient documentation

## 2020-03-31 DIAGNOSIS — I428 Other cardiomyopathies: Secondary | ICD-10-CM | POA: Diagnosis not present

## 2020-03-31 DIAGNOSIS — G4733 Obstructive sleep apnea (adult) (pediatric): Secondary | ICD-10-CM | POA: Insufficient documentation

## 2020-03-31 DIAGNOSIS — E785 Hyperlipidemia, unspecified: Secondary | ICD-10-CM | POA: Diagnosis not present

## 2020-03-31 LAB — BASIC METABOLIC PANEL
Anion gap: 8 (ref 5–15)
BUN: 13 mg/dL (ref 6–20)
CO2: 27 mmol/L (ref 22–32)
Calcium: 9.7 mg/dL (ref 8.9–10.3)
Chloride: 102 mmol/L (ref 98–111)
Creatinine, Ser: 1.11 mg/dL (ref 0.61–1.24)
GFR calc Af Amer: >60 mL/min (ref >60)
GFR calc non Af Amer: >60 mL/min (ref >60)
Glucose, Bld: 141 mg/dL — ABNORMAL HIGH (ref 70–99)
Potassium: 4.2 mmol/L (ref 3.5–5.1)
Sodium: 137 mmol/L (ref 135–145)

## 2020-03-31 LAB — ECHOCARDIOGRAM LIMITED

## 2020-03-31 MED ORDER — FUROSEMIDE 40 MG PO TABS: 20.0000 mg | ORAL_TABLET | Freq: Every day | ORAL | 11 refills | Status: DC

## 2020-03-31 MED ORDER — PERFLUTREN LIPID MICROSPHERE
1.0000 mL | INTRAVENOUS | Status: DC | PRN
  Administered 2020-03-31: 3 mL via INTRAVENOUS
  Filled 2020-03-31: qty 10

## 2020-03-31 MED ORDER — ENTRESTO 97-103 MG PO TABS: 1.0000 | ORAL_TABLET | Freq: Two times a day (BID) | ORAL | 3 refills | Status: DC

## 2020-03-31 NOTE — Progress Notes (Signed)
Echocardiogram 2D Echocardiogram has been performed.  Gary Jacobs 03/31/2020, 11:53 AM

## 2020-03-31 NOTE — Patient Instructions (Signed)
INCREASE Entresto to 97/103mg  twice daily.  DECREASE Lasix to 20mg  daily  Routine lab work today. Will notify you of abnormal results  Repeat labs in 10 days  Follow up with PharmD in 3 weeks  Follow up with Dr.McLean in 3 months

## 2020-04-01 NOTE — Progress Notes (Signed)
PCP: Laurey Morale, MD HF Cardiology: Dr. Shirlee Latch  49 y.o. with history of HTN, DM, OSA on CPAP, and nonischemic cardiomyopathy was referred by Tereso Newcomer for evaluation of CHF.  Patient was admitted in 11/20 with dyspnea and leg swelling. He was found to have CHF.  Echo in 11/20 showed EF 20-25%.  LHC/RHC showed elevated filling pressures, CI 2.0, and mild nonobstructive CAD. He was started on a regimen of cardiac meds.  Cardiac MRI in 1/21 showed EF still low at 21% with severely dilated LV.  There was no late gadolinium enhancement.    Echo was done today and reviewed, EF 30-35%, normal RV.   Weight is down 15 lbs.  He has been doing well, walking twice a day with his wife.  No exertional dyspnea.  No chest pain.  No orthopnea/PND.  He uses CPAP regularly.   Labs (11/20): K 5.1, creatinine 1.56 Labs (1/21): creatinine 1.38 Labs (3/21): K 4.6, creatinine 1.22 Labs (4/21): K 4.7, creatinine 1.42  PMH: 1. Type 2 diabetes 2. HTN 3. Hyperlipidemia 4. OSA: Uses CPAP.  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  6. CKD stage 3 7. Chronic systolic CHF: Nonischemic cardiomyopathy.  - Echo (11/20): EF 20-25%, mild-moderate MR.  - LHC (11/20): Nonobstructive CAD; mean RA 10, PA 60/30 mean 40, mean PCWP 27, CI 2.0.  - Cardiac MRI (1/21): Severe LV enlargement with EF 21%, global hypokinesis, RV EF 33%, no LGE.  - Echo (7/21): EF 30-35%, normal RV.   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  No ETOH, no smoking.   FH: Mother with "heart issues," died when he was born.  No other known heart disease.   ROS: All systems reviewed and negative except as per HPI.   Current Outpatient Medications  Medication Sig Dispense Refill  . aspirin 81 MG chewable tablet Chew 81 mg by mouth daily.    Marland Kitchen atorvastatin (LIPITOR) 40 MG tablet Take 1 tablet (40 mg total) by mouth at bedtime. 30 tablet 11  . carvedilol (COREG) 25 MG tablet Take 1 tablet (25 mg total)  by mouth 2 (two) times daily. 60 tablet 11  . dapagliflozin propanediol (FARXIGA) 10 MG TABS tablet Take 10 mg by mouth daily before breakfast. 30 tablet 11  . Dulaglutide (TRULICITY) 1.5 MG/0.5ML SOPN Inject 1.5 mg into the skin once a week.    . ferrous sulfate 325 (65 FE) MG tablet Take 325 mg by mouth daily with breakfast.    . furosemide (LASIX) 40 MG tablet Take 0.5 tablets (20 mg total) by mouth daily. 15 tablet 11  . glipiZIDE (GLUCOTROL XL) 10 MG 24 hr tablet Take 10 mg by mouth daily with breakfast.    . metFORMIN (GLUCOPHAGE) 1000 MG tablet Take 1,000 mg by mouth 2 (two) times daily with a meal.     . Multiple Vitamin (MULTIVITAMIN WITH MINERALS) TABS tablet Take 1 tablet by mouth daily.    Marland Kitchen spironolactone (ALDACTONE) 25 MG tablet Take 1 tablet (25 mg total) by mouth daily. 30 tablet 11  . Vitamin D, Ergocalciferol, (DRISDOL) 1.25 MG (50000 UT) CAPS capsule Take 50,000 Units by mouth every 7 (seven) days.    . sacubitril-valsartan (ENTRESTO) 97-103 MG Take 1 tablet by mouth 2 (two) times daily. 60 tablet 3   No current facility-administered medications for this encounter.   BP 120/82   Pulse 85   Wt (!) 152.9 kg (337 lb)  SpO2 99%   BMI 47.00 kg/m  General: NAD, obese Neck: No JVD, no thyromegaly or thyroid nodule.  Lungs: Clear to auscultation bilaterally with normal respiratory effort. CV: Nondisplaced PMI.  Heart regular S1/S2, no S3/S4, no murmur.  No peripheral edema.  No carotid bruit.  Normal pedal pulses.  Abdomen: Soft, nontender, no hepatosplenomegaly, no distention.  Skin: Intact without lesions or rashes.  Neurologic: Alert and oriented x 3.  Psych: Normal affect. Extremities: No clubbing or cyanosis.  HEENT: Normal.   Assessment/Plan: 1. Chronic systolic CHF:  Nonischemic cardiomyopathy. No definite family history of cardiomyopathy.  No ETOH or drugs. Cath with nonobstructive disease in 11/20.  Cardiac MRI in 1/21 showed EF 21% and severe LV dilation, no LGE.  Possible viral myocarditis versus diabetic cardiomyopathy.  BP does not seem to have been markedly high in the past so doubt that HTN explains CMP.  On exam today, he is euvolemic.  NYHA class I-II symptoms.  - Increase Entresto to 97/103 bid, BMET today and in 10 days.  - Decrease Lasix to 20 mg daily.   - Continue Coreg 25 mg bid.  - Continue spironolactone 25 mg daily.  - Continue dapagliflozin 10 mg daily.   - Next step will be addition of Bidil.   - EF is still low on echo today on good meds; given young age (despite nonischemic cardiomyopathy), would consider ICD.  He has a narrow QRS, not candidate for CRT.  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  We agreed to keep adjusting meds and repeat echo in another 6 months.  2. CKD: Stage 3, likely due to DM and HTN.   - BMET today.  3. HTN: BP is controlled.  4. OSA: Continue to use CPAP.  5. Type 2 diabetes: Dapagliflozin.   Followup with HF pharmacist in 3 wks for medication titration (Bidil), see me in 3 months.   Marca Ancona 04/01/2020

## 2020-04-02 ENCOUNTER — Telehealth (HOSPITAL_COMMUNITY): Payer: Self-pay | Admitting: Pharmacist

## 2020-04-02 MED ORDER — ENTRESTO 97-103 MG PO TABS: 1.0000 | ORAL_TABLET | Freq: Two times a day (BID) | ORAL | 3 refills | Status: DC

## 2020-04-02 MED FILL — FARXIGA 10 MG TABLET: 10 | 30 days supply | Qty: 30 | Fill #2

## 2020-04-02 MED FILL — SPIRONOLACTONE 25 MG TABS: 25 | 30 days supply | Qty: 30 | Fill #2

## 2020-04-02 MED FILL — FUROSEMIDE 40 MG TAB: 40 | 30 days supply | Qty: 30 | Fill #1

## 2020-04-02 NOTE — Telephone Encounter (Signed)
Sent new Entresto prescription to [facility]. Previously sent to [facility].   Karle Plumber, PharmD, BCPS, BCCP, CPP Heart Failure Clinic Pharmacist [phone]

## 2020-04-09 ENCOUNTER — Ambulatory Visit (HOSPITAL_COMMUNITY)
Admission: RE | Admit: 2020-04-09 | Discharge: 2020-04-09 | Disposition: A | Discharge status: Home / Self Care | Payer: BC Managed Care – PPO | Source: Ambulatory Visit | Attending: Cardiology | Admitting: Cardiology

## 2020-04-09 ENCOUNTER — Other Ambulatory Visit: Payer: Self-pay

## 2020-04-09 DIAGNOSIS — I509 Heart failure, unspecified: Secondary | ICD-10-CM | POA: Diagnosis not present

## 2020-04-09 LAB — BASIC METABOLIC PANEL
Anion gap: 11 (ref 5–15)
BUN: 15 mg/dL (ref 6–20)
CO2: 25 mmol/L (ref 22–32)
Calcium: 9.6 mg/dL (ref 8.9–10.3)
Chloride: 102 mmol/L (ref 98–111)
Creatinine, Ser: 1.39 mg/dL — ABNORMAL HIGH (ref 0.61–1.24)
GFR calc Af Amer: >60 mL/min (ref >60)
GFR calc non Af Amer: 60 mL/min — ABNORMAL LOW (ref >60)
Glucose, Bld: 202 mg/dL — ABNORMAL HIGH (ref 70–99)
Potassium: 4.2 mmol/L (ref 3.5–5.1)
Sodium: 138 mmol/L (ref 135–145)

## 2020-04-28 ENCOUNTER — Inpatient Hospital Stay (HOSPITAL_COMMUNITY)
Admission: RE | Admit: 2020-04-28 | Discharge: 2020-04-28 | Disposition: A | Discharge status: Home / Self Care | Payer: BC Managed Care – PPO | Source: Ambulatory Visit

## 2020-04-30 ENCOUNTER — Other Ambulatory Visit (HOSPITAL_COMMUNITY): Payer: BC Managed Care – PPO

## 2020-05-01 IMAGING — MR MR CARD MORPHOLOGY WO/W CM
39 of 41 series · 39 of 41 positions shown · IV contrast (Gadavist)
Comparison: none

CLINICAL DATA: Heart failure, systolic, further testing Chronic
systolic congestive heart failure and dilated cardiomyopathy

EXAM:
CARDIAC MRI
TECHNIQUE: The patient was scanned on a 1.5 Tesla GE magnet. A dedicated
cardiac coil was used. Functional imaging was done using Fiesta
sequences. [DATE], and 4 chamber views were done to assess for RWMA's.
Modified Dhruv rule using a short axis stack was used to
calculate an ejection fraction on a dedicated work station using
Circle software. The patient received 14mL GADAVIST GADOBUTROL 1
MMOL/ML IV SOLN. After 10 minutes inversion recovery sequences were
used to assess for infiltration and scar tissue.
CONTRAST:  14mL GADAVIST GADOBUTROL 1 MMOL/ML IV SOLN

[Series 7: bSSFP · oblique · 8.0mm · 1.70mm/px · 1 of 25 slices shown (1 of 20)]
[im 1/25]
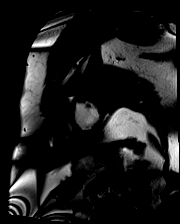

[Series 7: bSSFP · oblique · 8.0mm · 1.70mm/px · 1 of 25 slices shown (2 of 20)]
[im 1/25]
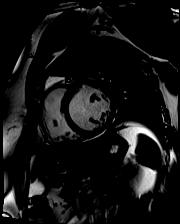

[Series 7: bSSFP · oblique · 8.0mm · 1.70mm/px · 1 of 25 slices shown (3 of 20)]
[im 1/25]
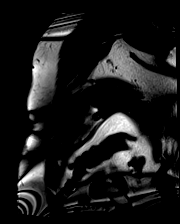

[Series 7: bSSFP · oblique · 8.0mm · 1.70mm/px · 1 of 25 slices shown (4 of 20)]
[im 1/25]
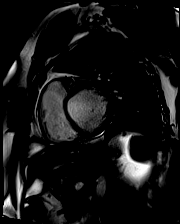

[Series 7: bSSFP · oblique · 8.0mm · 1.70mm/px · 1 of 25 slices shown (5 of 20)]
[im 1/25]
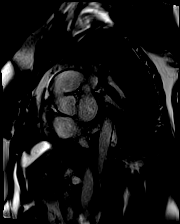

[Series 7: bSSFP · oblique · 8.0mm · 1.70mm/px · 1 of 25 slices shown (6 of 20)]
[im 1/25]
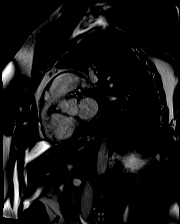

[Series 7: bSSFP · oblique · 8.0mm · 1.70mm/px · 1 of 25 slices shown (7 of 20)]
[im 1/25]
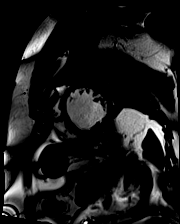

[Series 7: bSSFP · oblique · 8.0mm · 1.70mm/px · 1 of 25 slices shown (8 of 20)]
[im 1/25]
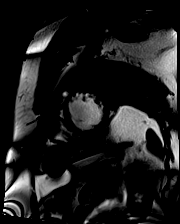

[Series 7: bSSFP · oblique · 8.0mm · 1.70mm/px · 1 of 25 slices shown (9 of 20)]
[im 1/25]
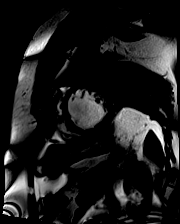

[Series 7: bSSFP · oblique · 8.0mm · 1.70mm/px · 1 of 25 slices shown (10 of 20)]
[im 1/25]
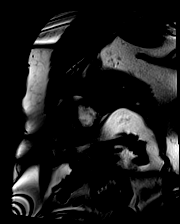

[Series 7: bSSFP · oblique · 8.0mm · 1.70mm/px · 1 of 25 slices shown (11 of 20)]
[im 1/25]
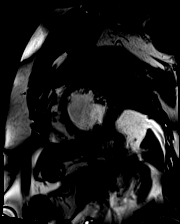

[Series 7: bSSFP · oblique · 8.0mm · 1.70mm/px · 1 of 25 slices shown (12 of 20)]
[im 1/25]
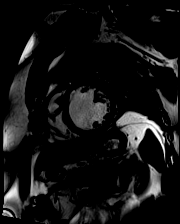

[Series 7: bSSFP · oblique · 8.0mm · 1.70mm/px · 1 of 25 slices shown (13 of 20)]
[im 1/25]
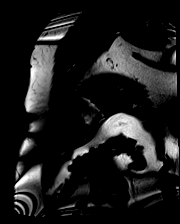

[Series 7: bSSFP · oblique · 8.0mm · 1.70mm/px · 1 of 25 slices shown (14 of 20)]
[im 1/25]
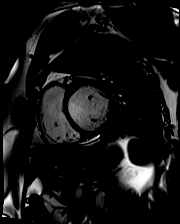

[Series 7: bSSFP · oblique · 8.0mm · 1.70mm/px · 1 of 25 slices shown (15 of 20)]
[im 1/25]
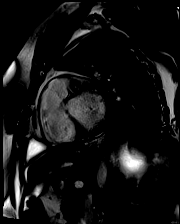

[Series 7: bSSFP · oblique · 8.0mm · 1.70mm/px · 1 of 25 slices shown (16 of 20)]
[im 1/25]
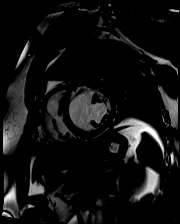

[Series 7: bSSFP · oblique · 8.0mm · 1.70mm/px · 1 of 25 slices shown (17 of 20)]
[im 1/25]
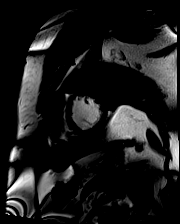

[Series 9: (id)_long_t1_moco · oblique · 8.0mm · 1.48mm/px · 1 of 24 slices shown]
[im 1/24]
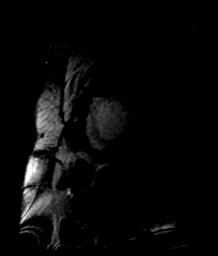

[Series 10: (id)_long_t1_moco_t1 · oblique · 8.0mm · 1.48mm/px · 1 of 6 slices shown]
[im 1/6]
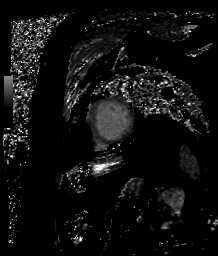

[Series 13: (id)_trufi_moco · oblique · 8.0mm · 1.98mm/px · 1 of 9 slices shown]
[im 1/9]
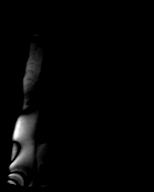

[Series 14: (id)_trufi_moco_t2 · oblique · 8.0mm · 1.98mm/px · 1 of 6 slices shown]
[im 1/6]
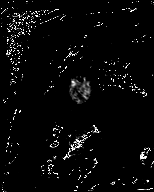

[Series 16: t2_stir_db_radial ((date)ch) · axial · 6.0mm · 1.83mm/px · 1 of 2 slices shown]
[im 1/2]
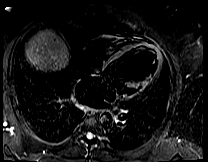

[Series 17: bSSFP · axial · 6.5mm · 1.41mm/px · 1 of 25 slices shown (18 of 20)]
[im 1/25]
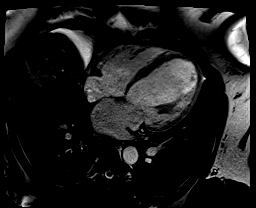

[Series 18: bSSFP · axial · 6.5mm · 1.41mm/px · 1 of 25 slices shown (19 of 20)]
[im 1/25]
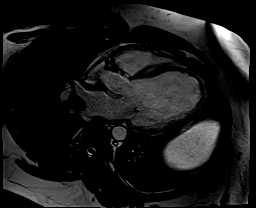

[Series 19: bSSFP · coronal · 6.5mm · 1.41mm/px · 1 of 25 slices shown (20 of 20)]
[im 1/25]
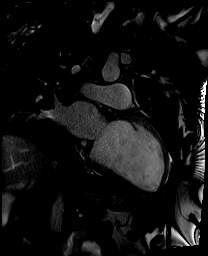

[Series 21: lge_single shot sa · oblique · 8.0mm · 1.98mm/px · 1 of 17 slices shown (1 of 2)]
[im 1/17]
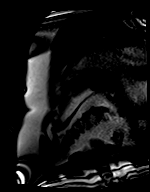

[Series 22: lge_single shot sa · oblique · 8.0mm · 1.98mm/px · 1 of 17 slices shown (2 of 2)]
[im 1/17]
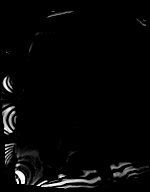

[Series 23: lge_single shot radial_mag · axial · 6.0mm · 1.98mm/px · 1 of 1 slices shown (1 of 2)]
[im 1/1]
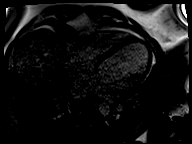

[Series 24: lge_single shot radial_psir · axial · 6.0mm · 1.98mm/px · 1 of 1 slices shown (1 of 2)]
[im 1/1]
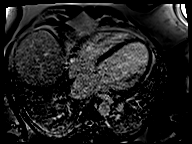

[Series 25: lge_single shot radial_mag · axial · 6.0mm · 1.98mm/px · 1 of 1 slices shown (2 of 2)]
[im 1/1]
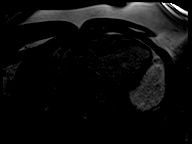

[Series 26: lge_single shot radial_psir · axial · 6.0mm · 1.98mm/px · 1 of 1 slices shown (2 of 2)]
[im 1/1]
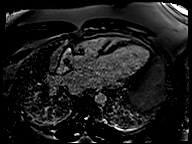

[Series 30: (id)_short_t1_moco · oblique · 8.0mm · 1.48mm/px · 1 of 27 slices shown]
[im 1/27]
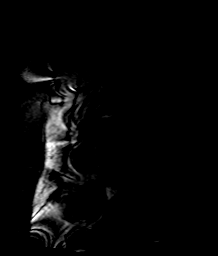

[Series 31: (id)_short_t1_moco_t1 · oblique · 8.0mm · 1.48mm/px · 1 of 4 slices shown]
[im 1/4]
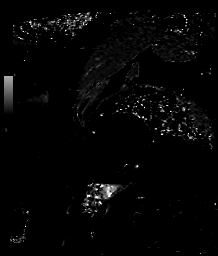

[Series 34: lge short axis_mag · oblique · 8.0mm · 1.79mm/px · 1 of 15 slices shown]
[im 1/15]
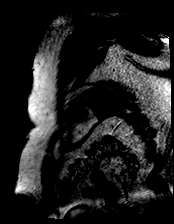

[Series 35: lge short axis_psir · oblique · 8.0mm · 1.79mm/px · 1 of 15 slices shown]
[im 1/15]
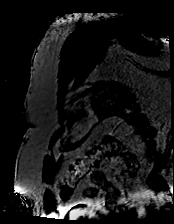

[Series 36: lge radial ((date)ch)_mag · axial · 6.0mm · 1.79mm/px · 1 of 1 slices shown (1 of 2)]
[im 1/1]
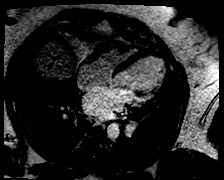

[Series 37: lge radial ((date)ch)_psir · axial · 6.0mm · 1.79mm/px · 1 of 1 slices shown (1 of 2)]
[im 1/1]
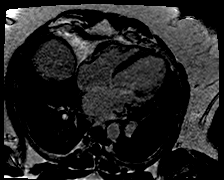

[Series 42: lge radial ((date)ch)_mag · axial · 6.0mm · 1.79mm/px · 1 of 1 slices shown (2 of 2)]
[im 1/1]
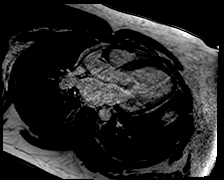

[Series 43: lge radial ((date)ch)_psir · axial · 6.0mm · 1.79mm/px · 1 of 1 slices shown (2 of 2)]
[im 1/1]
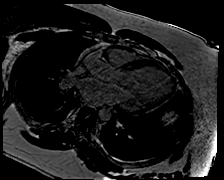

[39 of 41 positions shown; findings below may reference images not displayed]

FINDINGS: LEFT VENTRICLE:

Severe left ventricular enlargement with severely reduced systolic
function (LVEF = 21%). Severe global hypokinesis.

There is no definite late gadolinium enhancement in the left
ventricular myocardium.

Normal T1 myocardial nulling kinetics suggests against a diagnosis
of cardiac amyloidosis.

T1 precontrast myocardium = 1926 ms

ROUXANA1 precontrast blood pool = 8248 ms

ROUXANA1 postcontrast myocardium = 361 ms

ROUXANA1 postcontrast blood pool = 244 ms

ECV = 28%

These values suggest no definite infiltrative process in the
myocardium.

RIGHT VENTRICLE:

Mild-moderate RV enlargement with mildly reduced systolic function
(RVEF = 33%). Global hypokinesis.

ATRIA:

Severe left atrial enlargement, mild-moderate right atrial
enlargement.

VALVES:

Qualitatively mild mitral valve regurgitation and trivial aortic
valve regurgitation.

PERICARDIUM:

Normal pericardium.  No pericardial effusion.

OTHER:

MEASUREMENTS:

LVEDD:73 mm

LVESD: 66 mm

LVEDV: 363 mL

LVESV: 287 mL

SV: 76 mL

CO: 6.5 L/min

Myocardial mass: 190 g

RVEDV: 198 mL

RVEDS: 132 mL

RVSV: 66 mL
IMPRESSION: 1. Severe left ventricular enlargement with severely reduced
systolic function, LVEF 21%.

2. Mild-moderate right ventricular enlargement with mildly reduced
systolic function, RVEF 33%.

3. No delayed myocardial enhancement in the myocardium. Pre and post
contrast T1 values and ECV suggest no definite infiltrative process
in the myocardium. No evidence of scar, fibrosis, infarct.

## 2020-05-04 MED FILL — FUROSEMIDE 40 MG TAB: 40 | 30 days supply | Qty: 30 | Fill #0 | Status: TO

## 2020-05-04 MED FILL — FARXIGA 10 MG TABLET: 10 | 30 days supply | Qty: 30 | Fill #0

## 2020-05-04 MED FILL — SPIRONOLACTONE 25 MG TABS: 25 | 30 days supply | Qty: 30 | Fill #0 | Status: TO

## 2020-05-04 MED FILL — FARXIGA 10 MG TABLET: 10 | 30 days supply | Qty: 30 | Fill #0 | Status: TO

## 2020-05-04 MED FILL — FUROSEMIDE 40 MG TAB: 40 | 30 days supply | Qty: 30 | Fill #0

## 2020-05-04 MED FILL — SPIRONOLACTONE 25 MG TABS: 25 | 30 days supply | Qty: 30 | Fill #0

## 2020-05-14 NOTE — Progress Notes (Signed)
PCP: Laurey Morale, MD HF Cardiology: Dr. Shirlee Latch  HPI:  49 y.o. with history of HTN, DM, OSA on CPAP, and nonischemic cardiomyopathy was referred by Tereso Newcomer for evaluation of CHF.  Patient was admitted in 11/20 with dyspnea and leg swelling. He was found to have CHF.  Echo in 11/20 showed EF 20-25%.  LHC/RHC showed elevated filling pressures, CI 2.0, and mild nonobstructive CAD. He was started on a regimen of cardiac meds.  Cardiac MRI in 1/21 showed EF still low at 21% with severely dilated LV.  There was no late gadolinium enhancement.    Echo was done 03/31/20 and reviewed, EF 30-35%, normal RV.   Recently presented to HF Clinic with Dr. Shirlee Latch on 03/31/20. Weight was down 15 lbs.  He had been doing well, walking twice a day with his wife.  No exertional dyspnea.  No chest pain.  No orthopnea/PND.  He reported using CPAP regularly.   Today he returns to HF clinic for pharmacist medication titration. At last visit with MD, Sherryll Burger was increased to 97/103 mg BID and furosemide was decreased to 20 mg daily. Overall he is feeling well today. No dizziness, lightheadedness, chest pain or palpitations. No SOB/DOE. He has increased his activity level. 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 His weights have been stable at home, ranging 327-334 lbs. He takes furosemide 20 mg daily and has not needed any extra. No LEE, PND or orthopnea. His appetite has decreased since he has been working to lose weight. Taking all medications as prescribed and tolerating all medications.    HF Medications: Carvedilol 25 mg BID Entresto 97/103 mg BID Spironolactone 25 mg daily Dapagliflozin 10 mg daily Furosemide 20 mg daily  Has the patient been experiencing any side effects to the medications prescribed?  no  Does the patient have any problems obtaining medications due to transportation or finances?   Not currently. Has no  prescription insurance. Obtains Entresto through [employer] patient assistance program. Obtains all other heart failure medications through HF fund at [facility].   Understanding of regimen: good Understanding of indications: good Potential of compliance: good Patient understands to avoid NSAIDs. Patient understands to avoid decongestants.    Pertinent Lab Values (04/09/20): Marland Kitchen Serum creatinine 1.39, BUN 15, Potassium 4.2, Sodium 138  Vital Signs: . Weight: 336.4 lbs (last clinic weight: 337 lbs) . Blood pressure: 120/84  . Heart rate: 68   Assessment: 1. Chronic systolic CHF:  Nonischemic cardiomyopathy. No definite family history of cardiomyopathy.  No ETOH or drugs. Cath with nonobstructive disease in 11/20.  Cardiac MRI in 1/21 showed EF 21% and severe LV dilation, no LGE. Possible viral myocarditis versus diabetic cardiomyopathy.  BP does not seem to have been markedly high in the past so doubt that HTN explains CMP.   -On exam today, he is euvolemic.  NYHA class I-II symptoms.  - Continue furosemide 20 mg daily.   - Continue carvedilol 25 mg BID. - Continue Entresto 97/103 mg BID  - Continue spironolactone 25 mg daily.  - Continue dapagliflozin 10 mg daily.   - Start hydralazine 12.5 mg TID and isosorbide mononitrate 15 mg daily. Will get through HF Fund at [facility] as he has no prescription insurance.   - EF was still low on echo 03/31/2020 on good meds; given young age (despite nonischemic cardiomyopathy), would consider ICD.  He has a narrow QRS,  not candidate for CRT.  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  We agreed to keep adjusting meds and repeat echo in another 6 months.  2. CKD: Stage 3, likely due to DM and HTN.   3. HTN: BP is controlled.  4. OSA: Continue to use CPAP.  5. Type 2 diabetes: Dapagliflozin.    Plan: 1) Medication changes: Based on  clinical presentation, vital signs and recent labs will start hydralazine 12.5 mg TID and isosorbide mononitrate 15 mg daily. 2) Follow-up: 4 weeks with Dr. Shirlee Latch.    Karle Plumber, PharmD, BCPS, BCCP, CPP Heart Failure Clinic Pharmacist [phone]

## 2020-05-25 ENCOUNTER — Other Ambulatory Visit (HOSPITAL_COMMUNITY): Payer: Self-pay | Admitting: Cardiology

## 2020-05-25 ENCOUNTER — Other Ambulatory Visit: Payer: Self-pay

## 2020-05-25 ENCOUNTER — Ambulatory Visit (HOSPITAL_COMMUNITY)
Admission: RE | Admit: 2020-05-25 | Discharge: 2020-05-25 | Disposition: A | Discharge status: Home / Self Care | Payer: BC Managed Care – PPO | Source: Ambulatory Visit | Attending: Internal Medicine | Admitting: Internal Medicine

## 2020-05-25 VITALS — BP 120/84 | HR 68 | Wt 336.4 lb

## 2020-05-25 DIAGNOSIS — I130 Hypertensive heart and chronic kidney disease with heart failure and stage 1 through stage 4 chronic kidney disease, or unspecified chronic kidney disease: Secondary | ICD-10-CM | POA: Diagnosis not present

## 2020-05-25 DIAGNOSIS — G4733 Obstructive sleep apnea (adult) (pediatric): Secondary | ICD-10-CM | POA: Diagnosis not present

## 2020-05-25 DIAGNOSIS — I428 Other cardiomyopathies: Secondary | ICD-10-CM | POA: Insufficient documentation

## 2020-05-25 DIAGNOSIS — E1122 Type 2 diabetes mellitus with diabetic chronic kidney disease: Secondary | ICD-10-CM | POA: Insufficient documentation

## 2020-05-25 DIAGNOSIS — I110 Hypertensive heart disease with heart failure: Secondary | ICD-10-CM | POA: Diagnosis present

## 2020-05-25 DIAGNOSIS — N183 Chronic kidney disease, stage 3 unspecified: Secondary | ICD-10-CM | POA: Insufficient documentation

## 2020-05-25 DIAGNOSIS — I5022 Chronic systolic (congestive) heart failure: Secondary | ICD-10-CM | POA: Diagnosis not present

## 2020-05-25 MED ORDER — HYDRALAZINE HCL 25 MG PO TABS: 12.5000 mg | ORAL_TABLET | Freq: Three times a day (TID) | ORAL | 11 refills | Status: DC

## 2020-05-25 MED ORDER — ISOSORBIDE MONONITRATE ER 30 MG PO TB24: 15.0000 mg | ORAL_TABLET | Freq: Every day | ORAL | 11 refills | Status: DC

## 2020-05-25 MED FILL — ISOSORBIDE MN ER 30 MG TAB: 30 | 30 days supply | Qty: 15 | Fill #0

## 2020-05-25 MED FILL — hydrALAZINE HCL 25 MG TABS: 25 | 30 days supply | Qty: 45 | Fill #0

## 2020-05-25 NOTE — Patient Instructions (Signed)
It was a pleasure seeing you today!  MEDICATIONS: -We are changing your medications today -Start hydralazine 12.5 mg (1/2 tablet) three times daily -Start isosorbide mononitrate 15 mg (1/2 tablet) daily -Call if you have questions about your medications.   NEXT APPOINTMENT: Return to clinic in 4 weeks with Dr. Shirlee Latch.  In general, to take care of your heart failure: -Limit your fluid intake to 2 Liters (half-gallon) per day.   -Limit your salt intake to ideally 2-3 grams (2000-3000 mg) per day. -Weigh yourself daily and record, and bring that "weight diary" to your next appointment.  (Weight gain of 2-3 pounds in 1 day typically means fluid weight.) -The medications for your heart are to help your heart and help you live longer.   -Please contact us before stopping any of your heart medications.  Call the clinic at [phone] with questions or to reschedule future appointments.

## 2020-06-02 ENCOUNTER — Other Ambulatory Visit (HOSPITAL_COMMUNITY): Payer: Self-pay | Admitting: *Deleted

## 2020-06-02 DIAGNOSIS — I5022 Chronic systolic (congestive) heart failure: Secondary | ICD-10-CM

## 2020-06-02 MED ORDER — CARVEDILOL 25 MG PO TABS: 25.0000 mg | ORAL_TABLET | Freq: Two times a day (BID) | ORAL | 11 refills | Status: DC

## 2020-06-02 MED FILL — FUROSEMIDE 40 MG TAB: 40 | 30 days supply | Qty: 30 | Fill #1

## 2020-06-02 MED FILL — SPIRONOLACTONE 25 MG TABS: 25 | 30 days supply | Qty: 30 | Fill #1

## 2020-06-02 MED FILL — FARXIGA 10 MG TABLET: 10 | 30 days supply | Qty: 30 | Fill #1

## 2020-06-03 MED FILL — CARVEDILOL 25 MG TABLET: 25 | 30 days supply | Qty: 60 | Fill #0

## 2020-06-23 MED FILL — hydrALAZINE HCL 25 MG TABS: 25 | 30 days supply | Qty: 45 | Fill #1

## 2020-06-23 MED FILL — ISOSORBIDE MN ER 30 MG TAB: 30 | 30 days supply | Qty: 15 | Fill #1

## 2020-06-30 ENCOUNTER — Encounter (HOSPITAL_COMMUNITY): Payer: BC Managed Care – PPO | Admitting: Cardiology

## 2020-07-01 MED FILL — CARVEDILOL 25 MG TABLET: 25 | 30 days supply | Qty: 60 | Fill #1

## 2020-07-01 MED FILL — FARXIGA 10 MG TABLET: 10 | 30 days supply | Qty: 30 | Fill #2

## 2020-07-01 MED FILL — FUROSEMIDE 40 MG TAB: 40 | 30 days supply | Qty: 30 | Fill #2

## 2020-07-08 MED FILL — SPIRONOLACTONE 25 MG TABS: 25 | 30 days supply | Qty: 30 | Fill #2

## 2020-07-22 MED FILL — ISOSORBIDE MN ER 30 MG TAB: 30 | 30 days supply | Qty: 15 | Fill #2

## 2020-07-28 MED FILL — CARVEDILOL 25 MG TABLET: 25 | 30 days supply | Qty: 60 | Fill #2

## 2020-07-28 MED FILL — hydrALAZINE HCL 25 MG TABS: 25 | 30 days supply | Qty: 45 | Fill #2

## 2020-07-28 MED FILL — FARXIGA 10 MG TABLET: 10 | 30 days supply | Qty: 30 | Fill #3

## 2020-08-09 MED FILL — FUROSEMIDE 40 MG TAB: 40 | 30 days supply | Qty: 30 | Fill #3

## 2020-08-09 MED FILL — SPIRONOLACTONE 25 MG TABS: 25 | 30 days supply | Qty: 30 | Fill #3

## 2020-08-20 ENCOUNTER — Ambulatory Visit (HOSPITAL_COMMUNITY)
Admission: RE | Admit: 2020-08-20 | Discharge: 2020-08-20 | Disposition: A | Discharge status: Home / Self Care | Payer: BC Managed Care – PPO | Source: Ambulatory Visit | Attending: Cardiology | Admitting: Cardiology

## 2020-08-20 ENCOUNTER — Encounter (HOSPITAL_COMMUNITY): Payer: Self-pay | Admitting: Cardiology

## 2020-08-20 ENCOUNTER — Other Ambulatory Visit: Payer: Self-pay

## 2020-08-20 VITALS — BP 112/70 | HR 82 | Ht 71.0 in | Wt 338.8 lb

## 2020-08-20 DIAGNOSIS — I50812 Chronic right heart failure: Secondary | ICD-10-CM | POA: Diagnosis not present

## 2020-08-20 DIAGNOSIS — I2510 Atherosclerotic heart disease of native coronary artery without angina pectoris: Secondary | ICD-10-CM | POA: Diagnosis not present

## 2020-08-20 DIAGNOSIS — Z7982 Long term (current) use of aspirin: Secondary | ICD-10-CM | POA: Insufficient documentation

## 2020-08-20 DIAGNOSIS — E1122 Type 2 diabetes mellitus with diabetic chronic kidney disease: Secondary | ICD-10-CM | POA: Diagnosis not present

## 2020-08-20 DIAGNOSIS — I428 Other cardiomyopathies: Secondary | ICD-10-CM | POA: Insufficient documentation

## 2020-08-20 DIAGNOSIS — I130 Hypertensive heart and chronic kidney disease with heart failure and stage 1 through stage 4 chronic kidney disease, or unspecified chronic kidney disease: Secondary | ICD-10-CM | POA: Diagnosis not present

## 2020-08-20 DIAGNOSIS — N183 Chronic kidney disease, stage 3 unspecified: Secondary | ICD-10-CM | POA: Diagnosis not present

## 2020-08-20 DIAGNOSIS — Z86718 Personal history of other venous thrombosis and embolism: Secondary | ICD-10-CM | POA: Insufficient documentation

## 2020-08-20 DIAGNOSIS — Z7984 Long term (current) use of oral hypoglycemic drugs: Secondary | ICD-10-CM | POA: Diagnosis not present

## 2020-08-20 DIAGNOSIS — G4733 Obstructive sleep apnea (adult) (pediatric): Secondary | ICD-10-CM | POA: Diagnosis not present

## 2020-08-20 DIAGNOSIS — Z79899 Other long term (current) drug therapy: Secondary | ICD-10-CM | POA: Insufficient documentation

## 2020-08-20 DIAGNOSIS — E785 Hyperlipidemia, unspecified: Secondary | ICD-10-CM | POA: Diagnosis not present

## 2020-08-20 DIAGNOSIS — I5022 Chronic systolic (congestive) heart failure: Secondary | ICD-10-CM | POA: Diagnosis not present

## 2020-08-20 LAB — BASIC METABOLIC PANEL
Anion gap: 13 (ref 5–15)
BUN: 13 mg/dL (ref 6–20)
CO2: 23 mmol/L (ref 22–32)
Calcium: 9.6 mg/dL (ref 8.9–10.3)
Chloride: 100 mmol/L (ref 98–111)
Creatinine, Ser: 1.31 mg/dL — ABNORMAL HIGH (ref 0.61–1.24)
GFR, Estimated: >60 mL/min (ref >60)
Glucose, Bld: 179 mg/dL — ABNORMAL HIGH (ref 70–99)
Potassium: 4.3 mmol/L (ref 3.5–5.1)
Sodium: 136 mmol/L (ref 135–145)

## 2020-08-20 LAB — LIPID PANEL
Cholesterol: 101 mg/dL (ref 0–200)
HDL: 38 mg/dL — ABNORMAL LOW (ref >40)
LDL Cholesterol: 34 mg/dL (ref 0–99)
Total CHOL/HDL Ratio: 2.7 RATIO
Triglycerides: 147 mg/dL (ref <150)
VLDL: 29 mg/dL (ref 0–40)

## 2020-08-20 MED ORDER — HYDRALAZINE HCL 25 MG PO TABS
25.0000 mg | ORAL_TABLET | Freq: Three times a day (TID) | ORAL | 11 refills | Status: DC
Start: 2020-08-20 — End: 2020-08-24

## 2020-08-20 MED ORDER — ISOSORBIDE MONONITRATE ER 30 MG PO TB24
30.0000 mg | ORAL_TABLET | Freq: Every day | ORAL | 11 refills | Status: DC
Start: 2020-08-20 — End: 2020-11-04

## 2020-08-20 NOTE — Patient Instructions (Addendum)
Labs done today. We will only contact you if your labs are abnormal.  INCREASE Hydralazine 25mg  (1 tablet) by mouth 3 times daily.  INCREASE Imdur 30mg  (1 tablet) by mouth daily.  No other medication changes were made. Please continue all current medications as prescribed.   Your physician recommends that you schedule a follow-up appointment in: 2 months with an echo prior to your appointment.  Your physician has requested that you have an echocardiogram. Echocardiography is a painless test that uses sound waves to create images of your heart. It provides your doctor with information about the size and shape of your heart and how well your heart's chambers and valves are working. This procedure takes approximately one hour. There are no restrictions for this procedure.   If you have any questions or concerns before your next appointment please send a message through [place] or call our office at [phone].    TO LEAVE A MESSAGE FOR THE NURSE SELECT OPTION 2, PLEASE LEAVE A MESSAGE INCLUDING: . YOUR NAME . DATE OF BIRTH . CALL BACK NUMBER . REASON FOR CALL**this is important as we prioritize the call backs  YOU WILL RECEIVE A CALL BACK THE SAME DAY AS LONG AS YOU CALL BEFORE 4:00 PM   Do the following things EVERYDAY: 1) Weigh yourself in the morning before breakfast. Write it down and keep it in a log. 2) Take your medicines as prescribed 3) Eat low salt foods--Limit salt (sodium) to 2000 mg per day.  4) Stay as active as you can everyday 5) Limit all fluids for the day to less than 2 liters    At the [facility], you and your health needs are our priority. As part of our continuing mission to provide you with exceptional heart care, we have created designated Provider Care Teams. These Care Teams include your primary Cardiologist (physician) and Advanced Practice Providers (APPs- Physician Assistants and Nurse Practitioners) who all work together to provide  you with the care you need, when you need it.   You may see any of the following providers on your designated Care Team at your next follow up: [address] [phone] . Dr Marland Kitchen . Arvilla Meres, NP . Marca Ancona, PA . Tonye Becket, PharmD   Please be sure to bring in all your medications bottles to every appointment.

## 2020-08-22 NOTE — Progress Notes (Signed)
PCP: Laurey Morale, MD HF Cardiology: Dr. Shirlee Latch  49 y.o. with history of HTN, DM, OSA on CPAP, and nonischemic cardiomyopathy was referred by Tereso Newcomer for evaluation of CHF.  Patient was admitted in 11/20 with dyspnea and leg swelling. He was found to have CHF.  Echo in 11/20 showed EF 20-25%.  LHC/RHC showed elevated filling pressures, CI 2.0, and mild nonobstructive CAD. He was started on a regimen of cardiac meds.  Cardiac MRI in 1/21 showed EF still low at 21% with severely dilated LV.  There was no late gadolinium enhancement.    Echo in 7/21 showed EF 30-35%, normal RV.   Weight is stable.  No significant exertional dyspnea.  He is working full time, job is physical but he has no limitations.  No chest pain.  No orthopnea/PND.  No lightheadedness.  He uses CPAP regularly.   ECG (personally reviewed): NSR, poor RWP  Labs (11/20): K 5.1, creatinine 1.56 Labs (1/21): creatinine 1.38 Labs (3/21): K 4.6, creatinine 1.22 Labs (4/21): K 4.7, creatinine 1.42 Labs (7/21): K 4.2, creatinine 1.39  PMH: 1. Type 2 diabetes 2. HTN 3. Hyperlipidemia 4. OSA: Uses CPAP.  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  6. CKD stage 3 7. Chronic systolic CHF: Nonischemic cardiomyopathy.  - Echo (11/20): EF 20-25%, mild-moderate MR.  - LHC (11/20): Nonobstructive CAD; mean RA 10, PA 60/30 mean 40, mean PCWP 27, CI 2.0.  - Cardiac MRI (1/21): Severe LV enlargement with EF 21%, global hypokinesis, RV EF 33%, no LGE.  - Echo (7/21): EF 30-35%, normal RV.   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  No ETOH, no smoking.   FH: Mother with "heart issues," died when he was born.  No other known heart disease.   ROS: All systems reviewed and negative except as per HPI.   Current Outpatient Medications  Medication Sig Dispense Refill  . aspirin 81 MG chewable tablet Chew 81 mg by mouth daily.    Marland Kitchen atorvastatin (LIPITOR) 40 MG tablet Take 1 tablet (40 mg  total) by mouth at bedtime. 30 tablet 11  . carvedilol (COREG) 25 MG tablet Take 1 tablet (25 mg total) by mouth 2 (two) times daily. 60 tablet 11  . dapagliflozin propanediol (FARXIGA) 10 MG TABS tablet Take 10 mg by mouth daily before breakfast. 30 tablet 11  . Dulaglutide (TRULICITY) 1.5 MG/0.5ML SOPN Inject 1.5 mg into the skin once a week.    . ferrous sulfate 325 (65 FE) MG tablet Take 325 mg by mouth daily with breakfast.    . furosemide (LASIX) 40 MG tablet Take 0.5 tablets (20 mg total) by mouth daily. 15 tablet 11  . glipiZIDE (GLUCOTROL XL) 10 MG 24 hr tablet Take 10 mg by mouth daily with breakfast.    . hydrALAZINE (APRESOLINE) 25 MG tablet Take 1 tablet (25 mg total) by mouth 3 (three) times daily. 30 tablet 11  . isosorbide mononitrate (IMDUR) 30 MG 24 hr tablet Take 1 tablet (30 mg total) by mouth daily. 30 tablet 11  . metFORMIN (GLUCOPHAGE) 1000 MG tablet Take 1,000 mg by mouth 2 (two) times daily with a meal.     . Multiple Vitamin (MULTIVITAMIN WITH MINERALS) TABS tablet Take 1 tablet by mouth daily.    . sacubitril-valsartan (ENTRESTO) 97-103 MG Take 1 tablet by mouth 2 (two) times daily. 180 tablet 3  . spironolactone (ALDACTONE) 25 MG tablet Take 1  tablet (25 mg total) by mouth daily. 30 tablet 11  . Vitamin D, Ergocalciferol, (DRISDOL) 1.25 MG (50000 UT) CAPS capsule Take 50,000 Units by mouth every 7 (seven) days.     No current facility-administered medications for this encounter.   BP 112/70   Pulse 82   Ht 5\' 11"  (1.803 m)   Wt (!) 153.7 kg (338 lb 12.8 oz)   SpO2 97%   BMI 47.25 kg/m  General: NAD, obese Neck: No JVD, no thyromegaly or thyroid nodule.  Lungs: Clear to auscultation bilaterally with normal respiratory effort. CV: Nondisplaced PMI.  Heart regular S1/S2, no S3/S4, no murmur.  No peripheral edema.  No carotid bruit.  Normal pedal pulses.  Abdomen: Soft, nontender, no hepatosplenomegaly, no distention.  Skin: Intact without lesions or rashes.   Neurologic: Alert and oriented x 3.  Psych: Normal affect. Extremities: No clubbing or cyanosis.  HEENT: Normal.   Assessment/Plan: 1. Chronic systolic CHF:  Nonischemic cardiomyopathy. No definite family history of cardiomyopathy.  No ETOH or drugs. Cath with nonobstructive disease in 11/20.  Cardiac MRI in 1/21 showed EF 21% and severe LV dilation, no LGE. Possible viral myocarditis versus diabetic cardiomyopathy.  BP does not seem to have been markedly high in the past so doubt that HTN explains CMP.  On exam today, he is euvolemic.  NYHA class I-II symptoms.  - Continue Entresto 97/103 bid.  - Continue Lasix 20 mg daily.  BMET today.  - Continue Coreg 25 mg bid.  - Continue spironolactone 25 mg daily.  - Continue dapagliflozin 10 mg daily.   - Increase hydralazine to 25 mg tid and Imdur to 30 mg daily.  - Repeat echo in 2/22, if EF remains low will need to consider ICD.  He has a narrow QRS, not candidate for CRT.   2. CKD: Stage 3, likely due to DM and HTN.   - BMET today.  3. HTN: BP is controlled.  4. OSA: Continue to use CPAP.  5. Type 2 diabetes: Dapagliflozin.  6. Hyperlipidemia: On atorvastatin.  - Check lipids today.   Followup in 2/22 with echo.   3/22 08/22/2020

## 2020-08-24 ENCOUNTER — Other Ambulatory Visit (HOSPITAL_COMMUNITY): Payer: Self-pay | Admitting: *Deleted

## 2020-08-24 MED ORDER — ENTRESTO 97-103 MG PO TABS: 1.0000 | ORAL_TABLET | Freq: Two times a day (BID) | ORAL | 3 refills | Status: DC

## 2020-08-24 MED ORDER — HYDRALAZINE HCL 25 MG PO TABS: 25.0000 mg | ORAL_TABLET | Freq: Three times a day (TID) | ORAL | 3 refills | Status: DC

## 2020-09-01 MED FILL — FARXIGA 10 MG TABLET: 10 | 30 days supply | Qty: 30 | Fill #4

## 2020-09-02 MED FILL — CARVEDILOL 25 MG TABLET: 25 | 30 days supply | Qty: 60 | Fill #3

## 2020-09-13 MED FILL — SPIRONOLACTONE 25 MG TABS: 25 | 30 days supply | Qty: 30 | Fill #4

## 2020-09-13 MED FILL — FUROSEMIDE 40 MG TAB: 40 | 30 days supply | Qty: 30 | Fill #4

## 2020-10-01 MED FILL — CARVEDILOL 25 MG TABLET: 25 | 30 days supply | Qty: 60 | Fill #4

## 2020-10-01 MED FILL — FARXIGA 10 MG TABLET: 10 | 30 days supply | Qty: 30 | Fill #5

## 2020-10-15 MED FILL — SPIRONOLACTONE 25 MG TABS: 25 | 30 days supply | Qty: 30 | Fill #5

## 2020-10-15 MED FILL — FUROSEMIDE 40 MG TAB: 40 | 30 days supply | Qty: 30 | Fill #5

## 2020-11-01 MED FILL — FARXIGA 10 MG TABLET: 10 | 30 days supply | Qty: 30 | Fill #6

## 2020-11-02 MED FILL — CARVEDILOL 25 MG TABLET: 25 | 30 days supply | Qty: 60 | Fill #5

## 2020-11-04 ENCOUNTER — Other Ambulatory Visit (HOSPITAL_COMMUNITY): Payer: Self-pay | Admitting: Cardiology

## 2020-11-04 ENCOUNTER — Encounter (HOSPITAL_COMMUNITY): Payer: Self-pay | Admitting: Cardiology

## 2020-11-04 ENCOUNTER — Encounter (HOSPITAL_COMMUNITY): Payer: BC Managed Care – PPO | Admitting: Cardiology

## 2020-11-04 ENCOUNTER — Ambulatory Visit (HOSPITAL_BASED_OUTPATIENT_CLINIC_OR_DEPARTMENT_OTHER)
Admission: RE | Admit: 2020-11-04 | Discharge: 2020-11-04 | Disposition: A | Discharge status: Home / Self Care | Payer: BC Managed Care – PPO | Source: Ambulatory Visit | Attending: Cardiology | Admitting: Cardiology

## 2020-11-04 ENCOUNTER — Other Ambulatory Visit: Payer: Self-pay

## 2020-11-04 ENCOUNTER — Ambulatory Visit (HOSPITAL_COMMUNITY)
Admission: RE | Admit: 2020-11-04 | Discharge: 2020-11-04 | Disposition: A | Discharge status: Home / Self Care | Payer: BC Managed Care – PPO | Source: Ambulatory Visit | Attending: Cardiology | Admitting: Cardiology

## 2020-11-04 VITALS — BP 122/78 | HR 75 | Wt 330.0 lb

## 2020-11-04 DIAGNOSIS — Z7982 Long term (current) use of aspirin: Secondary | ICD-10-CM | POA: Insufficient documentation

## 2020-11-04 DIAGNOSIS — M7989 Other specified soft tissue disorders: Secondary | ICD-10-CM | POA: Insufficient documentation

## 2020-11-04 DIAGNOSIS — Z79899 Other long term (current) drug therapy: Secondary | ICD-10-CM | POA: Diagnosis not present

## 2020-11-04 DIAGNOSIS — N183 Chronic kidney disease, stage 3 unspecified: Secondary | ICD-10-CM | POA: Insufficient documentation

## 2020-11-04 DIAGNOSIS — Z86718 Personal history of other venous thrombosis and embolism: Secondary | ICD-10-CM | POA: Insufficient documentation

## 2020-11-04 DIAGNOSIS — I130 Hypertensive heart and chronic kidney disease with heart failure and stage 1 through stage 4 chronic kidney disease, or unspecified chronic kidney disease: Secondary | ICD-10-CM | POA: Insufficient documentation

## 2020-11-04 DIAGNOSIS — I2510 Atherosclerotic heart disease of native coronary artery without angina pectoris: Secondary | ICD-10-CM | POA: Diagnosis not present

## 2020-11-04 DIAGNOSIS — E785 Hyperlipidemia, unspecified: Secondary | ICD-10-CM | POA: Diagnosis not present

## 2020-11-04 DIAGNOSIS — Z7984 Long term (current) use of oral hypoglycemic drugs: Secondary | ICD-10-CM | POA: Diagnosis not present

## 2020-11-04 DIAGNOSIS — I428 Other cardiomyopathies: Secondary | ICD-10-CM | POA: Insufficient documentation

## 2020-11-04 DIAGNOSIS — I5022 Chronic systolic (congestive) heart failure: Secondary | ICD-10-CM | POA: Diagnosis not present

## 2020-11-04 DIAGNOSIS — E1122 Type 2 diabetes mellitus with diabetic chronic kidney disease: Secondary | ICD-10-CM | POA: Diagnosis not present

## 2020-11-04 DIAGNOSIS — G4733 Obstructive sleep apnea (adult) (pediatric): Secondary | ICD-10-CM | POA: Diagnosis not present

## 2020-11-04 LAB — BASIC METABOLIC PANEL
Anion gap: 7 (ref 5–15)
BUN: 12 mg/dL (ref 6–20)
CO2: 27 mmol/L (ref 22–32)
Calcium: 9.7 mg/dL (ref 8.9–10.3)
Chloride: 103 mmol/L (ref 98–111)
Creatinine, Ser: 1.28 mg/dL — ABNORMAL HIGH (ref 0.61–1.24)
GFR, Estimated: >60 mL/min (ref >60)
Glucose, Bld: 157 mg/dL — ABNORMAL HIGH (ref 70–99)
Potassium: 4.4 mmol/L (ref 3.5–5.1)
Sodium: 137 mmol/L (ref 135–145)

## 2020-11-04 LAB — ECHOCARDIOGRAM COMPLETE
Area-P 1/2: 2.11 cm2
Calc EF: 44.2 %
S' Lateral: 3.9 cm
Single Plane A2C EF: 44.7 %
Single Plane A4C EF: 47.1 %

## 2020-11-04 MED ORDER — HYDRALAZINE HCL 50 MG PO TABS
50.0000 mg | ORAL_TABLET | Freq: Three times a day (TID) | ORAL | 3 refills | Status: DC
Start: 2020-11-04 — End: 2021-02-01

## 2020-11-04 MED ORDER — ISOSORBIDE MONONITRATE ER 60 MG PO TB24
60.0000 mg | ORAL_TABLET | Freq: Every day | ORAL | 3 refills | Status: DC
Start: 2020-11-04 — End: 2020-11-04

## 2020-11-04 MED FILL — ISOSORBIDE MONONITRATE ER 6: 60 | 30 days supply | Qty: 30 | Fill #0

## 2020-11-04 NOTE — Patient Instructions (Addendum)
Labs done today. We will contact you only if your labs are abnormal.  INCREASE Hydralazine to 50mg  (1 tablet) by mouth 3 times daily  INCREASE Imdur to 60mg  (1 tablet) by mouth daily.  No other medication changes were made. Please continue all current medications as prescribed.  Your physician recommends that you schedule a follow-up appointment in: 3 months with [facility] here in our office  If you have any questions or concerns before your next appointment please send a message through [place] or call our office at [phone].    TO LEAVE A MESSAGE FOR THE NURSE SELECT OPTION 2, PLEASE LEAVE A MESSAGE INCLUDING: . YOUR NAME . DATE OF BIRTH . CALL BACK NUMBER . REASON FOR CALL**this is important as we prioritize the call backs  YOU WILL RECEIVE A CALL BACK THE SAME DAY AS LONG AS YOU CALL BEFORE 4:00 PM   Do the following things EVERYDAY: 1) Weigh yourself in the morning before breakfast. Write it down and keep it in a log. 2) Take your medicines as prescribed 3) Eat low salt foods--Limit salt (sodium) to 2000 mg per day.  4) Stay as active as you can everyday 5) Limit all fluids for the day to less than 2 liters   At the [facility], you and your health needs are our priority. As part of our continuing mission to provide you with exceptional heart care, we have created designated Provider Care Teams. These Care Teams include your primary Cardiologist (physician) and Advanced Practice Providers (APPs- Physician Assistants and Nurse Practitioners) who all work together to provide you with the care you need, when you need it.   You may see any of the following providers on your designated Care Team at your next follow up: [address] [phone] . Dr Marland Kitchen . Arvilla Meres, NP . Marca Ancona, PA . Tonye Becket, PharmD   Please be sure to bring in all your medications bottles to every appointment.

## 2020-11-04 NOTE — Progress Notes (Signed)
  Echocardiogram 2D Echocardiogram has been performed.  Augustine Radar 11/04/2020, 12:07 PM

## 2020-11-05 NOTE — Progress Notes (Signed)
PCP: Laurey Morale, MD HF Cardiology: Dr. Shirlee Latch  50 y.o. with history of HTN, DM, OSA on CPAP, and nonischemic cardiomyopathy was referred by Tereso Newcomer for evaluation of CHF.  Patient was admitted in 11/20 with dyspnea and leg swelling. He was found to have CHF.  Echo in 11/20 showed EF 20-25%.  LHC/RHC showed elevated filling pressures, CI 2.0, and mild nonobstructive CAD. He was started on a regimen of cardiac meds.  Cardiac MRI in 1/21 showed EF still low at 21% with severely dilated LV.  There was no late gadolinium enhancement.    Echo in 7/21 showed EF 30-35%, normal RV. Echo was done today and reviewed, EF up to 40-45%.   Patient returns for followup of CHF.  He is doing well, very active at work (physical job).  No exertional dyspnea.  No chest pain.  No lightheadedness.  No orthopnea/PND.  He uses CPAP regularly. Weight down 8 lbs.  Right lower leg has large venous varicosities and swells at times.   Labs (11/20): K 5.1, creatinine 1.56 Labs (1/21): creatinine 1.38 Labs (3/21): K 4.6, creatinine 1.22 Labs (4/21): K 4.7, creatinine 1.42 Labs (7/21): K 4.2, creatinine 1.39 Labs (12/21): K 4.3, creatinine 1.31, LDL 34  PMH: 1. Type 2 diabetes 2. HTN 3. Hyperlipidemia 4. OSA: Uses CPAP.  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  6. CKD stage 3 7. Chronic systolic CHF: Nonischemic cardiomyopathy.  - Echo (11/20): EF 20-25%, mild-moderate MR.  - LHC (11/20): Nonobstructive CAD; mean RA 10, PA 60/30 mean 40, mean PCWP 27, CI 2.0.  - Cardiac MRI (1/21): Severe LV enlargement with EF 21%, global hypokinesis, RV EF 33%, no LGE.  - Echo (7/21): EF 30-35%, normal RV.  - Echo (2/22): EF 40-45%, normal RV.   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  No ETOH, no smoking.   FH: Mother with "heart issues," died when he was born.  No other known heart disease.   ROS: All systems reviewed and negative except as per HPI.   Current  Outpatient Medications  Medication Sig Dispense Refill  . aspirin 81 MG chewable tablet Chew 81 mg by mouth daily.    Marland Kitchen atorvastatin (LIPITOR) 40 MG tablet Take 1 tablet (40 mg total) by mouth at bedtime. 30 tablet 11  . carvedilol (COREG) 25 MG tablet Take 1 tablet (25 mg total) by mouth 2 (two) times daily. 60 tablet 11  . dapagliflozin propanediol (FARXIGA) 10 MG TABS tablet Take 10 mg by mouth daily before breakfast. 30 tablet 11  . Dulaglutide (TRULICITY) 1.5 MG/0.5ML SOPN Inject 1.5 mg into the skin once a week.    . ferrous sulfate 325 (65 FE) MG EC tablet Take 325 mg by mouth once a week.    . furosemide (LASIX) 40 MG tablet Take 0.5 tablets (20 mg total) by mouth daily. 15 tablet 11  . glipiZIDE (GLUCOTROL XL) 10 MG 24 hr tablet Take 10 mg by mouth daily with breakfast.    . metFORMIN (GLUCOPHAGE) 1000 MG tablet Take 1,000 mg by mouth 2 (two) times daily with a meal.     . Multiple Vitamin (MULTIVITAMIN WITH MINERALS) TABS tablet Take 1 tablet by mouth daily.    . sacubitril-valsartan (ENTRESTO) 97-103 MG Take 1 tablet by mouth 2 (two) times daily. 180 tablet 3  . spironolactone (ALDACTONE) 25 MG tablet Take 1 tablet (25 mg total) by mouth daily. 30 tablet  11  . Vitamin D, Ergocalciferol, (DRISDOL) 1.25 MG (50000 UT) CAPS capsule Take 50,000 Units by mouth every 7 (seven) days.    . hydrALAZINE (APRESOLINE) 50 MG tablet Take 1 tablet (50 mg total) by mouth 3 (three) times daily. 90 tablet 3  . isosorbide mononitrate (IMDUR) 60 MG 24 hr tablet Take 1 tablet (60 mg total) by mouth daily. 90 tablet 3   No current facility-administered medications for this encounter.   BP 122/78   Pulse 75   Wt (!) 149.7 kg (330 lb)   SpO2 99%   BMI 46.03 kg/m  General: NAD, obese.  Neck: No JVD, no thyromegaly or thyroid nodule.  Lungs: Clear to auscultation bilaterally with normal respiratory effort. CV: Nondisplaced PMI.  Heart regular S1/S2, no S3/S4, no murmur.  No peripheral edema.  No carotid  bruit.  Normal pedal pulses.  Abdomen: Soft, nontender, no hepatosplenomegaly, no distention.  Skin: Intact without lesions or rashes.  Neurologic: Alert and oriented x 3.  Psych: Normal affect. Extremities: No clubbing or cyanosis. Extensive right lower leg venous varicosities.  HEENT: Normal.   Assessment/Plan: 1. Chronic systolic CHF:  Nonischemic cardiomyopathy. No definite family history of cardiomyopathy.  No ETOH or drugs. Cath with nonobstructive disease in 11/20.  Cardiac MRI in 1/21 showed EF 21% and severe LV dilation, no LGE. Possible viral myocarditis versus diabetic cardiomyopathy.  BP does not seem to have been markedly high in the past so doubt that HTN explains CMP.  Echo today showed EF up to 40-45%.  On exam today, he is euvolemic.  NYHA class I symptoms.  - Continue Entresto 97/103 bid.  - Continue Lasix 20 mg daily.  BMET today.  - Continue Coreg 25 mg bid.  - Continue spironolactone 25 mg daily.  - Continue dapagliflozin 10 mg daily.   - Increase hydralazine to 50 mg tid and Imdur to 60 mg daily.  - EF is out of ICD range.   2. CKD: Stage 3, likely due to DM and HTN.   - BMET today.  3. HTN: BP is controlled.  4. OSA: Continue to use CPAP.  5. Type 2 diabetes: Dapagliflozin.  6. Hyperlipidemia: On atorvastatin.  - Good lipids in 12/21.  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   Followup NP/PA in 3 months.   Marca Ancona 11/05/2020

## 2020-11-09 ENCOUNTER — Other Ambulatory Visit (HOSPITAL_COMMUNITY): Payer: Self-pay | Admitting: *Deleted

## 2020-11-09 DIAGNOSIS — I83891 Varicose veins of right lower extremities with other complications: Secondary | ICD-10-CM

## 2020-11-15 MED FILL — SPIRONOLACTONE 25 MG TABS: 25 | 30 days supply | Qty: 30 | Fill #6

## 2020-11-25 ENCOUNTER — Telehealth (HOSPITAL_COMMUNITY): Payer: Self-pay | Admitting: Pharmacy Technician

## 2020-11-25 NOTE — Telephone Encounter (Signed)
It's time to re-enroll patient to receive medication assistance for Entresto from [employer]. The patient currently has a Secondary school teacher. His 30 day co-pay is $150.66. Will activate a co-pay card on his behalf.  Will not seek assistance at this time.

## 2020-12-03 NOTE — Telephone Encounter (Signed)
Spoke with patient, activated $10 co-pay card for [facility]. Had the information sent to the patient's email to give to the pharmacy.  BIN [id number] [id number] [id number] ID [id number]  Asked Leavy Cella (CMA), to send in 90 day RX to [facility] for the patient.  Advised him to call me with any issues.   Archer Asa, CPhT

## 2020-12-06 ENCOUNTER — Encounter (HOSPITAL_COMMUNITY): Payer: Self-pay | Admitting: *Deleted

## 2020-12-06 ENCOUNTER — Telehealth (HOSPITAL_COMMUNITY): Payer: Self-pay | Admitting: *Deleted

## 2020-12-06 NOTE — Telephone Encounter (Signed)
Letter written and signed. Left at the front desk for pt to pick up. Pt is aware.

## 2020-12-06 NOTE — Telephone Encounter (Signed)
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 Pt said his last echo is 40-45% but Dr.McLean told him it looked more like 45%. Pt asks that our office write him a note for work.   Routed to Dr.McLean: Dr.McLean ok to write?

## 2020-12-06 NOTE — Telephone Encounter (Signed)
OK to write note, EF 45%.

## 2020-12-15 MED FILL — SPIRONOLACTONE 25 MG TABLET: 25 | 30 days supply | Qty: 30 | Fill #7

## 2020-12-15 MED FILL — CARVEDILOL 25 MG TABLET: 25 | 30 days supply | Qty: 60 | Fill #6

## 2020-12-18 ENCOUNTER — Other Ambulatory Visit (HOSPITAL_COMMUNITY): Payer: Self-pay

## 2021-01-06 ENCOUNTER — Other Ambulatory Visit (HOSPITAL_COMMUNITY): Payer: Self-pay

## 2021-01-06 ENCOUNTER — Telehealth (HOSPITAL_COMMUNITY): Payer: Self-pay

## 2021-01-06 ENCOUNTER — Other Ambulatory Visit (HOSPITAL_COMMUNITY): Payer: Self-pay | Admitting: Cardiology

## 2021-01-06 MED ORDER — FUROSEMIDE 40 MG PO TABS
40.0000 mg | ORAL_TABLET | Freq: Every day | ORAL | 3 refills | Status: DC
  Filled 2021-01-06: qty 30, 30d supply, fill #0

## 2021-01-06 MED FILL — Dapagliflozin Propanediol Tab 10 MG (Base Equivalent): ORAL | 30 days supply | Qty: 30 | Fill #0 | Status: AC

## 2021-01-06 NOTE — Telephone Encounter (Signed)
FYI [facility] has been unsuccessful in trying to contact patient to schedule an appointment. A letter was mailed to patients address on file.

## 2021-01-15 ENCOUNTER — Other Ambulatory Visit: Payer: Self-pay

## 2021-01-15 DIAGNOSIS — I83891 Varicose veins of right lower extremities with other complications: Secondary | ICD-10-CM

## 2021-01-17 ENCOUNTER — Ambulatory Visit: Payer: BC Managed Care – PPO | Admitting: Physician Assistant

## 2021-01-17 ENCOUNTER — Ambulatory Visit (HOSPITAL_COMMUNITY)
Admission: RE | Admit: 2021-01-17 | Discharge: 2021-01-17 | Disposition: A | Discharge status: Home / Self Care | Payer: BC Managed Care – PPO | Source: Ambulatory Visit | Attending: Surgery | Admitting: Surgery

## 2021-01-17 ENCOUNTER — Other Ambulatory Visit (HOSPITAL_COMMUNITY): Payer: Self-pay | Admitting: Cardiology

## 2021-01-17 ENCOUNTER — Other Ambulatory Visit (HOSPITAL_COMMUNITY): Payer: Self-pay

## 2021-01-17 ENCOUNTER — Other Ambulatory Visit: Payer: Self-pay

## 2021-01-17 VITALS — BP 129/90 | HR 74 | Temp 97.6°F | Resp 20 | Ht 71.0 in | Wt 338.6 lb

## 2021-01-17 DIAGNOSIS — I872 Venous insufficiency (chronic) (peripheral): Secondary | ICD-10-CM

## 2021-01-17 DIAGNOSIS — I83891 Varicose veins of right lower extremities with other complications: Secondary | ICD-10-CM | POA: Diagnosis not present

## 2021-01-17 DIAGNOSIS — I5022 Chronic systolic (congestive) heart failure: Secondary | ICD-10-CM

## 2021-01-17 DIAGNOSIS — I8393 Asymptomatic varicose veins of bilateral lower extremities: Secondary | ICD-10-CM

## 2021-01-17 NOTE — Progress Notes (Signed)
[facility]   Reason for referral: Swollen right leg  History of Present Illness  Gary Jacobs is a 50 y.o. male who presents with chief complaint: swollen right leg with multiple varicose veins.  He has a history of CHF with exacerbations.    He develops SOB and leg swelling with Echo in 11/20 showed EF 20-25%.    PMH: 1. Type 2 diabetes 2. HTN 3. Hyperlipidemia 4. OSA: Uses CPAP.  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  6. CKD stage 3 7. Chronic systolic CHF: Nonischemic cardiomyopathy.  - Echo (11/20): EF 20-25%, mild-moderate MR.  - LHC (11/20): Nonobstructive CAD; mean RA 10, PA 60/30 mean 40, mean PCWP 27, CI 2.0.  - Cardiac MRI (1/21): Severe LV enlargement with EF 21%, global hypokinesis, RV EF 33%, no LGE.  - Echo (7/21): EF 30-35%, normal RV.  - Echo (2/22): EF 40-45%, normal RV.   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  He had an injury as a Land and develops a right LE DVT.  The patient has had + history of DVT, + history of varicose vein, no history of venous stasis ulcers, no history of  Lymphedema and + history of skin changes in lower legs.  There is no family history of venous disorders.  The patient has  used compression stockings in the past.  He states the swelling is gone first thing in the am and as the days goes on his right LE gets very swollen.  He also notes prominent varicose veins over the anterior shin.  He denise skin weeping and non healing wounds.  Past Medical History:  Diagnosis Date  . Chronic systolic CHF    cMRI 09/2019: EF 21%, severe LVE, mild to mod RVE, mild reduced RVSF (33%), no LGE; T1/ECV not suggestive of infiltrative process; no scar, fibrosis, infarct  . Diabetes mellitus without complication (HCC)   . Dyslipidemia   . Hypertension   . Nonischemic cardiomyopathy   . Sleep apnea     Past  Surgical History:  Procedure Laterality Date  . RIGHT/LEFT HEART CATH AND CORONARY ANGIOGRAPHY N/A 08/04/2019   Procedure: RIGHT/LEFT HEART CATH AND CORONARY ANGIOGRAPHY;  Surgeon: Yvonne Kendall, MD;  Location: [facility];  Service: Cardiovascular;  Laterality: N/A;    Social History   Socioeconomic History  . Marital status: Married    Spouse name: Not on file  . Number of children: Not on file  . Years of education: Not on file  . Highest education level: Not on file  Occupational History  . Not on file  Tobacco Use  . Smoking status: Never Smoker  . Smokeless tobacco: Never Used  Vaping Use  . Vaping Use: Never used  Substance and Sexual Activity  . Alcohol use: Not Currently  . Drug use: Not Currently  . Sexual activity: Not on file  Other Topics Concern  . Not on file  Social History Narrative  . Not on file   Social Determinants of Health   Financial Resource Strain: Not on file  Food Insecurity: Not on file  Transportation Needs: Not on file  Physical Activity: Not on file  Stress: Not on file  Social Connections: Not on file  Intimate Partner Violence: Not on file    Family History  Problem Relation Age of Onset  . Heart disease Mother 64  died at childbirth    Current Outpatient Medications on File Prior to Visit  Medication Sig Dispense Refill  . aspirin 81 MG chewable tablet Chew 81 mg by mouth daily.    Marland Kitchen atorvastatin (LIPITOR) 40 MG tablet Take 1 tablet (40 mg total) by mouth at bedtime. 30 tablet 11  . carvedilol (COREG) 25 MG tablet Take 1 tablet (25 mg total) by mouth 2 (two) times daily. 60 tablet 11  . dapagliflozin propanediol (FARXIGA) 10 MG TABS tablet TAKE 1 TABLET BY MOUTH DAILY BEFORE BREAKFAST. 30 tablet 8  . Dulaglutide (TRULICITY) 1.5 MG/0.5ML SOPN Inject 1.5 mg into the skin once a week.    . ferrous sulfate 325 (65 FE) MG EC tablet Take 325 mg by mouth once a week.    . furosemide (LASIX) 40 MG tablet Take 0.5 tablets  (20 mg total) by mouth daily. 15 tablet 11  . glipiZIDE (GLUCOTROL XL) 10 MG 24 hr tablet Take 10 mg by mouth daily with breakfast.    . hydrALAZINE (APRESOLINE) 50 MG tablet Take 1 tablet (50 mg total) by mouth 3 (three) times daily. 90 tablet 3  . isosorbide mononitrate (IMDUR) 30 MG 24 hr tablet Take 30 mg by mouth daily.    . metFORMIN (GLUCOPHAGE) 1000 MG tablet Take 1,000 mg by mouth 2 (two) times daily with a meal.     . Multiple Vitamin (MULTIVITAMIN WITH MINERALS) TABS tablet Take 1 tablet by mouth daily.    . sacubitril-valsartan (ENTRESTO) 97-103 MG Take 1 tablet by mouth 2 (two) times daily. 180 tablet 3  . Vitamin D, Ergocalciferol, (DRISDOL) 1.25 MG (50000 UT) CAPS capsule Take 50,000 Units by mouth every 7 (seven) days.    Marland Kitchen spironolactone (ALDACTONE) 25 MG tablet TAKE 1 TABLET (25 MG TOTAL) BY MOUTH DAILY. 30 tablet 8   No current facility-administered medications on file prior to visit.    Allergies as of 01/17/2021  . (No Known Allergies)     ROS:   General:  No weight loss, Fever, chills  HEENT: No recent headaches, no nasal bleeding, no visual changes, no sore throat  Neurologic: No dizziness, blackouts, seizures. No recent symptoms of stroke or mini- stroke. No recent episodes of slurred speech, or temporary blindness.  Cardiac: No recent episodes of chest pain/pressure, no shortness of breath at rest.  positive shortness of breath with exertion.  Denies history of atrial fibrillation or irregular heartbeat  Positive CHF with fluid over load occasionally  Vascular: No history of rest pain in feet.  No history of claudication.  No history of non-healing ulcer, No history of DVT   Pulmonary: No home oxygen, no productive cough, no hemoptysis,  No asthma or wheezing  Musculoskeletal:  [ ]  Arthritis, [ ]  Low back pain,  [ ]  Joint pain  Hematologic:No history of hypercoagulable state.  No history of easy bleeding.  No history of anemia  Gastrointestinal: No  hematochezia or melena,  No gastroesophageal reflux, no trouble swallowing  Urinary: [x ] chronic Kidney disease, [ ]  on HD - [ ]  MWF or [ ]  TTHS, [ ]  Burning with urination, [ ]  Frequent urination, [ ]  Difficulty urinating;   Skin: No rashes  Psychological: No history of anxiety,  No history of depression  Physical Examination  Vitals:   01/17/21 1132  BP: 129/90  Pulse: 74  Resp: 20  Temp: 97.6 F (36.4 C)  TempSrc: Temporal  SpO2: 100%  Weight: (!) 338 lb 9.6 oz (153.6 kg)  Height: 5\' 11"  (1.803 m)    Body mass index is 47.23 kg/m.  General:  Alert and oriented, no acute distress HEENT: Normal Neck: No bruit or JVD Pulmonary: Clear to auscultation bilaterally Cardiac: Regular Rate and Rhythm without murmur Abdomen: Soft, non-tender, non-distended, no mass, no scars Skin: No rash    Extremity Pulses:  2+ radial, brachial, femoral, dorsalis pedis, posterior tibial pulses bilaterally Musculoskeletal: No deformity or edema  Neurologic: Upper and lower extremity motor 5/5 and symmetric  DATA: Venous Reflux Times  +--------------+---------+------+-----------+------------+----------------+   RIGHT     Reflux NoRefluxReflux TimeDiameter cmsComments                     Yes                         +--------------+---------+------+-----------+------------+----------------+   CFV            yes  >1 second                   +--------------+---------+------+-----------+------------+----------------+   FV prox          yes                         +--------------+---------+------+-----------+------------+----------------+   FV mid          yes                         +--------------+---------+------+-----------+------------+----------------+   FV dist          yes                          +--------------+---------+------+-----------+------------+----------------+   Popliteal         yes                         +--------------+---------+------+-----------+------------+----------------+   GSV at [facility]  no        >500 ms   1.21              +--------------+---------+------+-----------+------------+----------------+   GSV prox thighno        >500 ms   1.29              +--------------+---------+------+-----------+------------+----------------+   GSV mid thigh no        >500 ms   1.32              +--------------+---------+------+-----------+------------+----------------+   GSV dist thighno        >500 ms   1.30              +--------------+---------+------+-----------+------------+----------------+   GSV at knee  no        >500 ms   1.51  chronic  thrombus  +--------------+---------+------+-----------+------------+----------------+   GSV prox calf                 1.42              +--------------+---------+------+-----------+------------+----------------+   GSV mid calf                 1.45              +--------------+---------+------+-----------+------------+----------------+   SSV Pop Fossa no               0.563              +--------------+---------+------+-----------+------------+----------------+  SSV prox calf no               0.420              +--------------+---------+------+-----------+------------+----------------+   SSV mid calf                 0.493              +--------------+---------+------+-----------+------------+----------------+           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  Right:  -  No evidence of deep vein thrombosis seen in the right lower extremity,  from the common femoral through the popliteal veins.  - No evidence of superficial venous reflux seen in the right short  saphenous vein.  - Venous reflux is noted in the right common femoral vein.  - Venous reflux is noted in the right sapheno-femoral junction.  - Venous reflux is noted in the right greater saphenous vein in the thigh.  - Venous reflux is noted in the right greater saphenous vein in the calf.     He has palpable pedal pulses and is not at risk of losing toes or limbs.  The reflux is only in the deep system which is not treatable by intervention.  His GSV is competent.    Plan: He was fitted with thigh compression 20-30 mm hg to be worn daily, elevation when at rest.  He should stop about every 45 min. If possible to walk, and water therapy is available.   If he has problems or concerns in the future he will call.    Mosetta Pigeon PA-C [facility] Office: [phone]  MD in clinic [place]

## 2021-01-18 ENCOUNTER — Other Ambulatory Visit (HOSPITAL_COMMUNITY): Payer: Self-pay

## 2021-01-18 MED ORDER — CARVEDILOL 25 MG PO TABS
25.0000 mg | ORAL_TABLET | Freq: Two times a day (BID) | ORAL | 11 refills | Status: DC
  Filled 2021-01-18: qty 60, 30d supply, fill #0
  Filled 2021-02-22: qty 60, 30d supply, fill #1
  Filled 2021-03-29: qty 60, 30d supply, fill #2
  Filled 2021-04-26: qty 180, 90d supply, fill #3
  Filled 2021-05-17: qty 60, 30d supply, fill #3
  Filled 2021-06-24: qty 60, 30d supply, fill #4
  Filled 2021-07-28: qty 60, 30d supply, fill #5
  Filled 2021-09-05: qty 60, 30d supply, fill #6
  Filled 2021-10-12: qty 60, 30d supply, fill #7
  Filled 2021-11-15: qty 60, 30d supply, fill #8
  Filled 2021-12-22: qty 60, 30d supply, fill #9
  Filled 2022-01-13: qty 60, 30d supply, fill #10

## 2021-01-18 MED ORDER — SPIRONOLACTONE 25 MG PO TABS
25.0000 mg | ORAL_TABLET | Freq: Every day | ORAL | 8 refills | Status: DC
Start: 2021-01-18 — End: 2021-11-15
  Filled 2021-01-18: qty 30, 30d supply, fill #0
  Filled 2021-02-22: qty 30, 30d supply, fill #1
  Filled 2021-03-29: qty 30, 30d supply, fill #2
  Filled 2021-04-26: qty 30, 30d supply, fill #3
  Filled 2021-06-01: qty 30, 30d supply, fill #4
  Filled 2021-07-05: qty 30, 30d supply, fill #5
  Filled 2021-08-08 – 2021-08-16 (×2): qty 30, 30d supply, fill #6
  Filled 2021-09-14: qty 30, 30d supply, fill #7
  Filled 2021-10-12: qty 30, 30d supply, fill #8

## 2021-01-31 NOTE — Progress Notes (Addendum)
PCP: Laurey Morale, MD HF Cardiology: Dr. Shirlee Latch  50 y.o. with history of HTN, DM, OSA on CPAP, and nonischemic cardiomyopathy was referred by Tereso Newcomer for evaluation of CHF.  Patient was admitted in 11/20 with dyspnea and leg swelling. He was found to have CHF.  Echo in 11/20 showed EF 20-25%.  LHC/RHC showed elevated filling pressures, CI 2.0, and mild nonobstructive CAD. He was started on a regimen of cardiac meds.  Cardiac MRI in 1/21 showed EF still low at 21% with severely dilated LV.  There was no late gadolinium enhancement.    Echo in 7/21 showed EF 30-35%, normal RV.   10/2020 Echo reviewed by Dr McLean--> EF up to 40-45%.   Today he returns for HF follow up.Overall feeling fine. Denies SOB/PND/Orthopnea. Using CPAP. Appetite ok. No fever or chills. Weight at home has been stable. Taking all medications. 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   Labs (11/20): K 5.1, creatinine 1.56 Labs (1/21): creatinine 1.38 Labs (3/21): K 4.6, creatinine 1.22 Labs (4/21): K 4.7, creatinine 1.42 Labs (7/21): K 4.2, creatinine 1.39 Labs (12/21): K 4.3, creatinine 1.31, LDL 34 Labs (10/2020): K 4.4 Creatinine 1.28   PMH: 1. Type 2 diabetes 2. HTN 3. Hyperlipidemia 4. OSA: Uses CPAP.  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  6. CKD stage 3 7. Chronic systolic CHF: Nonischemic cardiomyopathy.  - Echo (11/20): EF 20-25%, mild-moderate MR.  - LHC (11/20): Nonobstructive CAD; mean RA 10, PA 60/30 mean 40, mean PCWP 27, CI 2.0.  - Cardiac MRI (1/21): Severe LV enlargement with EF 21%, global hypokinesis, RV EF 33%, no LGE.  - Echo (7/21): EF 30-35%, normal RV.  - Echo (2/22): EF 40-45%, normal RV.   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  No ETOH, no smoking.   FH: Mother with "heart issues," died when he was born.  No other known heart disease.   ROS: All systems reviewed and negative except as per HPI.   Current Outpatient  Medications  Medication Sig Dispense Refill  . aspirin 81 MG chewable tablet Chew 81 mg by mouth daily.    Marland Kitchen atorvastatin (LIPITOR) 40 MG tablet Take 1 tablet (40 mg total) by mouth at bedtime. 30 tablet 11  . carvedilol (COREG) 25 MG tablet Take 1 tablet (25 mg total) by mouth 2 (two) times daily. 60 tablet 11  . dapagliflozin propanediol (FARXIGA) 10 MG TABS tablet TAKE 1 TABLET BY MOUTH DAILY BEFORE BREAKFAST. 30 tablet 8  . Dulaglutide (TRULICITY) 1.5 MG/0.5ML SOPN Inject 1.5 mg into the skin once a week.    . ferrous sulfate 325 (65 FE) MG EC tablet Take 325 mg by mouth once a week.    . furosemide (LASIX) 40 MG tablet Take 0.5 tablets (20 mg total) by mouth daily. 15 tablet 11  . glipiZIDE (GLUCOTROL XL) 10 MG 24 hr tablet Take 10 mg by mouth daily with breakfast.    . hydrALAZINE (APRESOLINE) 50 MG tablet Take 1 tablet (50 mg total) by mouth 3 (three) times daily. 90 tablet 3  . isosorbide mononitrate (IMDUR) 30 MG 24 hr tablet Take 30 mg by mouth daily.    . metFORMIN (GLUCOPHAGE) 1000 MG tablet Take 1,000 mg by mouth 2 (two) times daily with a meal.     . Multiple Vitamin (MULTIVITAMIN WITH MINERALS) TABS tablet Take 1 tablet by mouth daily.    . sacubitril-valsartan (ENTRESTO) 97-103  MG Take 1 tablet by mouth 2 (two) times daily. 180 tablet 3  . spironolactone (ALDACTONE) 25 MG tablet Take 1 tablet (25 mg total) by mouth daily. 30 tablet 8  . Vitamin D, Ergocalciferol, (DRISDOL) 1.25 MG (50000 UT) CAPS capsule Take 50,000 Units by mouth every 7 (seven) days.     No current facility-administered medications for this encounter.   BP (!) 146/98   Pulse 80   Wt (!) 152.9 kg (337 lb)   SpO2 98%   BMI 47.00 kg/m   Wt Readings from Last 3 Encounters:  02/01/21 (!) 152.9 kg (337 lb)  01/17/21 (!) 153.6 kg (338 lb 9.6 oz)  11/04/20 (!) 149.7 kg (330 lb)    General:  Well appearing. No resp difficulty HEENT: normal Neck: supple. no JVD. Carotids 2+ bilat; no bruits. No  lymphadenopathy or thryomegaly appreciated. Cor: PMI nondisplaced. Regular rate & rhythm. No rubs, gallops or murmurs. Lungs: clear Abdomen: obese, soft, nontender, nondistended. No hepatosplenomegaly. No bruits or masses. Good bowel sounds. Extremities: no cyanosis, clubbing, rash, edema. RLE varicosities.  Neuro: alert & orientedx3, cranial nerves grossly intact. moves all 4 extremities w/o difficulty. Affect pleasant  Assessment/Plan: 1. Chronic systolic CHF:  Nonischemic cardiomyopathy. No definite family history of cardiomyopathy.  No ETOH or drugs. Cath with nonobstructive disease in 11/20.  Cardiac MRI in 1/21 showed EF 21% and severe LV dilation, no LGE. Possible viral myocarditis versus diabetic cardiomyopathy.  BP does not seem to have been markedly high in the past so doubt that HTN explains CMP.  Echo 2/20222 showed EF up to 40-45%.  -NYHA II. Volume status stable.  - Continue Entresto 97/103 bid.  - Continue Lasix 20 mg daily.   - Continue Coreg 25 mg bid.  - Continue spironolactone 25 mg daily.  - Continue dapagliflozin 10 mg daily.   - Increase hydralazine to 75 mg tid and continue imdur to 60 mg daily.  - EF is out of ICD range.   -Check BMET  2. CKD: Stage 3, likely due to DM and HTN.   - BMET today.  3. HTN: Elevated today. Increase hydralazine as noted above. .  4. OSA: Continue CPAP.   5. Type 2 diabetes: Dapagliflozin.  6. Hyperlipidemia: On atorvastatin.  - Good lipids in 12/21.  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    Follow up 3 months  Lache Dagher NP-C  02/01/2021

## 2021-02-01 ENCOUNTER — Encounter (HOSPITAL_COMMUNITY): Payer: Self-pay

## 2021-02-01 ENCOUNTER — Ambulatory Visit (HOSPITAL_COMMUNITY)
Admission: RE | Admit: 2021-02-01 | Discharge: 2021-02-01 | Disposition: A | Discharge status: Home / Self Care | Payer: BC Managed Care – PPO | Source: Ambulatory Visit | Attending: Adult Health | Admitting: Adult Health

## 2021-02-01 ENCOUNTER — Other Ambulatory Visit: Payer: Self-pay

## 2021-02-01 VITALS — BP 146/98 | HR 80 | Wt 337.0 lb

## 2021-02-01 DIAGNOSIS — M7989 Other specified soft tissue disorders: Secondary | ICD-10-CM | POA: Diagnosis not present

## 2021-02-01 DIAGNOSIS — Z794 Long term (current) use of insulin: Secondary | ICD-10-CM | POA: Diagnosis not present

## 2021-02-01 DIAGNOSIS — Z7901 Long term (current) use of anticoagulants: Secondary | ICD-10-CM | POA: Insufficient documentation

## 2021-02-01 DIAGNOSIS — E1122 Type 2 diabetes mellitus with diabetic chronic kidney disease: Secondary | ICD-10-CM | POA: Insufficient documentation

## 2021-02-01 DIAGNOSIS — Z79899 Other long term (current) drug therapy: Secondary | ICD-10-CM | POA: Diagnosis not present

## 2021-02-01 DIAGNOSIS — I2510 Atherosclerotic heart disease of native coronary artery without angina pectoris: Secondary | ICD-10-CM | POA: Diagnosis not present

## 2021-02-01 DIAGNOSIS — Z7982 Long term (current) use of aspirin: Secondary | ICD-10-CM | POA: Insufficient documentation

## 2021-02-01 DIAGNOSIS — I428 Other cardiomyopathies: Secondary | ICD-10-CM | POA: Insufficient documentation

## 2021-02-01 DIAGNOSIS — E785 Hyperlipidemia, unspecified: Secondary | ICD-10-CM | POA: Insufficient documentation

## 2021-02-01 DIAGNOSIS — I5022 Chronic systolic (congestive) heart failure: Secondary | ICD-10-CM

## 2021-02-01 DIAGNOSIS — G4733 Obstructive sleep apnea (adult) (pediatric): Secondary | ICD-10-CM | POA: Insufficient documentation

## 2021-02-01 DIAGNOSIS — Z7984 Long term (current) use of oral hypoglycemic drugs: Secondary | ICD-10-CM | POA: Insufficient documentation

## 2021-02-01 DIAGNOSIS — N183 Chronic kidney disease, stage 3 unspecified: Secondary | ICD-10-CM | POA: Insufficient documentation

## 2021-02-01 DIAGNOSIS — I130 Hypertensive heart and chronic kidney disease with heart failure and stage 1 through stage 4 chronic kidney disease, or unspecified chronic kidney disease: Secondary | ICD-10-CM | POA: Diagnosis not present

## 2021-02-01 LAB — BASIC METABOLIC PANEL
Anion gap: 6 (ref 5–15)
BUN: 13 mg/dL (ref 6–20)
CO2: 27 mmol/L (ref 22–32)
Calcium: 9.4 mg/dL (ref 8.9–10.3)
Chloride: 102 mmol/L (ref 98–111)
Creatinine, Ser: 1.21 mg/dL (ref 0.61–1.24)
GFR, Estimated: >60 mL/min (ref >60)
Glucose, Bld: 105 mg/dL — ABNORMAL HIGH (ref 70–99)
Potassium: 4.1 mmol/L (ref 3.5–5.1)
Sodium: 135 mmol/L (ref 135–145)

## 2021-02-01 MED ORDER — HYDRALAZINE HCL 50 MG PO TABS
75.0000 mg | ORAL_TABLET | Freq: Three times a day (TID) | ORAL | 11 refills | Status: DC
Start: 2021-02-01 — End: 2021-03-25

## 2021-02-01 NOTE — Patient Instructions (Addendum)
INCREASE Hydralazine to 75 mg, one and one half tab three times daily  Labs today We will only contact you if something comes back abnormal or we need to make some changes. Otherwise no news is good news!  Do the following things EVERYDAY: 1) Weigh yourself in the morning before breakfast. Write it down and keep it in a log. 2) Take your medicines as prescribed 3) Eat low salt foods--Limit salt (sodium) to 2000 mg per day.  4) Stay as active as you can everyday 5) Limit all fluids for the day to less than 2 liters  At the [facility], you and your health needs are our priority. As part of our continuing mission to provide you with exceptional heart care, we have created designated Provider Care Teams. These Care Teams include your primary Cardiologist (physician) and Advanced Practice Providers (APPs- Physician Assistants and Nurse Practitioners) who all work together to provide you with the care you need, when you need it.   You may see any of the following providers on your designated Care Team at your next follow up: Marland Kitchen Dr Arvilla Meres . Dr Marca Ancona . Dr Thornell Mule . Tonye Becket, NP . Robbie Lis, PA . Shanda Bumps [place],NP . Karle Plumber, PharmD   Please be sure to bring in all your medications bottles to every appointment.   If you have any questions or concerns before your next appointment please send [place] a message through [place] or call our office at [phone].    TO LEAVE A MESSAGE FOR THE NURSE SELECT OPTION 2, PLEASE LEAVE A MESSAGE INCLUDING: . YOUR NAME . DATE OF BIRTH . CALL BACK NUMBER . REASON FOR CALL**this is important as we prioritize the call backs  YOU WILL RECEIVE A CALL BACK THE SAME DAY AS LONG AS YOU CALL BEFORE 4:00 PM

## 2021-02-08 ENCOUNTER — Other Ambulatory Visit (HOSPITAL_COMMUNITY): Payer: Self-pay | Admitting: Cardiology

## 2021-02-08 ENCOUNTER — Other Ambulatory Visit (HOSPITAL_COMMUNITY): Payer: Self-pay

## 2021-02-09 ENCOUNTER — Other Ambulatory Visit (HOSPITAL_COMMUNITY): Payer: Self-pay

## 2021-02-09 ENCOUNTER — Other Ambulatory Visit (HOSPITAL_COMMUNITY): Payer: Self-pay | Admitting: Cardiology

## 2021-02-09 MED ORDER — FUROSEMIDE 40 MG PO TABS
40.0000 mg | ORAL_TABLET | Freq: Every day | ORAL | 3 refills | Status: DC
  Filled 2021-02-09: qty 30, 30d supply, fill #0
  Filled 2021-03-16: qty 30, 30d supply, fill #1

## 2021-02-11 ENCOUNTER — Other Ambulatory Visit (HOSPITAL_COMMUNITY): Payer: Self-pay

## 2021-02-11 ENCOUNTER — Other Ambulatory Visit (HOSPITAL_COMMUNITY): Payer: Self-pay | Admitting: Cardiology

## 2021-02-11 DIAGNOSIS — I5022 Chronic systolic (congestive) heart failure: Secondary | ICD-10-CM

## 2021-02-11 MED ORDER — DAPAGLIFLOZIN PROPANEDIOL 10 MG PO TABS
ORAL_TABLET | Freq: Every day | ORAL | 8 refills | Status: DC
  Filled 2021-02-11: qty 30, 30d supply, fill #0
  Filled 2021-03-16: qty 30, 30d supply, fill #1

## 2021-02-22 ENCOUNTER — Other Ambulatory Visit (HOSPITAL_COMMUNITY): Payer: Self-pay

## 2021-03-01 ENCOUNTER — Encounter (HOSPITAL_COMMUNITY): Payer: BC Managed Care – PPO

## 2021-03-16 ENCOUNTER — Other Ambulatory Visit (HOSPITAL_COMMUNITY): Payer: Self-pay

## 2021-03-23 ENCOUNTER — Encounter (HOSPITAL_COMMUNITY): Payer: BC Managed Care – PPO

## 2021-03-24 NOTE — Progress Notes (Signed)
[facility] PCP: Laurey Morale, MD HF Cardiology: Dr. Shirlee Latch  50 y.o. with history of HTN, DM, OSA on CPAP, and nonischemic cardiomyopathy was referred by Tereso Newcomer for evaluation of CHF.  Patient was admitted in 11/20 with dyspnea and leg swelling. He was found to have CHF.  Echo in 11/20 showed EF 20-25%.  LHC/RHC showed elevated filling pressures, CI 2.0, and mild nonobstructive CAD. He was started on a regimen of cardiac meds.  Cardiac MRI in 1/21 showed EF still low at 21% with severely dilated LV.  There was no late gadolinium enhancement.    Echo in 7/21 showed EF 30-35%, normal RV.   10/2020 Echo reviewed by Dr McLean--> EF up to 40-45%.   He returned 5/22 for HF follow up. Stable Class II symptoms. Weight at home has been stable. Taking all medications. 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 Hydralazine increased.  Today he returns for HF follow up. He cut back on hydralazine back to 50 tid because he started having sloughing off of skin on glans of penis, thought it was due to hydralazine increase. Overall feeling fine. Denies increasing SOB, CP, dizziness, edema, or PND/Orthopnea. Appetite ok. No fever or chills. Does not weigh at home. Taking all medications. Working out more with son who is home this summer.  Labs (11/20): K 5.1, creatinine 1.56 Labs (1/21): creatinine 1.38 Labs (3/21): K 4.6, creatinine 1.22 Labs (4/21): K 4.7, creatinine 1.42 Labs (7/21): K 4.2, creatinine 1.39 Labs (12/21): K 4.3, creatinine 1.31, LDL 34 Labs (10/2020): K 4.4 Creatinine 1.28  Labs (7/22): K 4.3, creatinine 1.05  PMH: 1. Type 2 diabetes 2. HTN 3. Hyperlipidemia 4. OSA: Uses CPAP.  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  6. CKD stage 3 7. Chronic systolic CHF: Nonischemic cardiomyopathy.  - Echo (11/20): EF 20-25%, mild-moderate MR.  - LHC (11/20): Nonobstructive CAD; mean RA 10, PA 60/30 mean 40, mean PCWP 27, CI 2.0.  - Cardiac MRI  (1/21): Severe LV enlargement with EF 21%, global hypokinesis, RV EF 33%, no LGE.  - Echo (7/21): EF 30-35%, normal RV.  - Echo (2/22): EF 40-45%, normal RV.   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  No ETOH, no smoking.   FH: Mother with "heart issues," died when he was born.  No other known heart disease.   ROS: All systems reviewed and negative except as per HPI.   Current Outpatient Medications  Medication Sig Dispense Refill   aspirin 81 MG chewable tablet Chew 81 mg by mouth daily.     atorvastatin (LIPITOR) 40 MG tablet Take 1 tablet (40 mg total) by mouth at bedtime. 30 tablet 11   carvedilol (COREG) 25 MG tablet Take 1 tablet (25 mg total) by mouth 2 (two) times daily. 60 tablet 11   dapagliflozin propanediol (FARXIGA) 10 MG TABS tablet TAKE 1 TABLET BY MOUTH DAILY BEFORE BREAKFAST. 30 tablet 8   Dulaglutide (TRULICITY) 1.5 MG/0.5ML SOPN Inject 1.5 mg into the skin once a week.     furosemide (LASIX) 40 MG tablet Take 0.5 tablets (20 mg total) by mouth daily. 15 tablet 11   glipiZIDE (GLUCOTROL XL) 10 MG 24 hr tablet Take 10 mg by mouth daily with breakfast.     hydrALAZINE (APRESOLINE) 50 MG tablet Take 1.5 tablets (75 mg total) by mouth 3 (three) times daily. 135 tablet 11   isosorbide mononitrate (IMDUR) 30 MG  24 hr tablet Take 30 mg by mouth daily.     metFORMIN (GLUCOPHAGE) 1000 MG tablet Take 1,000 mg by mouth 2 (two) times daily with a meal.      Multiple Vitamin (MULTIVITAMIN WITH MINERALS) TABS tablet Take 1 tablet by mouth daily.     sacubitril-valsartan (ENTRESTO) 97-103 MG Take 1 tablet by mouth 2 (two) times daily. 180 tablet 3   spironolactone (ALDACTONE) 25 MG tablet Take 1 tablet (25 mg total) by mouth daily. 30 tablet 8   No current facility-administered medications for this encounter.   BP 138/89   Pulse 77   Wt (!) 156.5 kg (345 lb)   SpO2 98%   BMI 48.12 kg/m    Wt Readings from Last 3 Encounters:  03/25/21 (!) 156.5 kg (345  lb)  02/01/21 (!) 152.9 kg (337 lb)  01/17/21 (!) 153.6 kg (338 lb 9.6 oz)   General:  NAD. No resp difficulty HEENT: Normal Neck: Supple. Thick neck. Carotids 2+ bilat; no bruits. No lymphadenopathy or thryomegaly appreciated. Cor: PMI nondisplaced. Regular rate & rhythm. No rubs, gallops or murmurs. Lungs: Clear Abdomen: Obese, nontender, nondistended. No hepatosplenomegaly. No bruits or masses. Good bowel sounds. Extremities: No cyanosis, clubbing, rash, edema, RLE varicose veins. Neuro: Alert & oriented x 3, cranial nerves grossly intact. Moves all 4 extremities w/o difficulty. Affect pleasant.  Assessment/Plan: 1. Chronic systolic CHF:  Nonischemic cardiomyopathy. No definite family history of cardiomyopathy.  No ETOH or drugs. Cath with nonobstructive disease in 11/20.  Cardiac MRI in 1/21 showed EF 21% and severe LV dilation, no LGE. Possible viral myocarditis versus diabetic cardiomyopathy.  BP does not seem to have been markedly high in the past so doubt that HTN explains CMP.  Echo 2/20222 showed EF up to 40-45%.  - NYHA II. Volume status stable, weight up 8 lbs.  - Continue Entresto 97/103 bid.  - Continue Lasix 20 mg daily.   - Continue Coreg 25 mg bid.  - Continue spironolactone 25 mg daily.  - Continue dapagliflozin 10 mg daily.  - Increase hydralazine back to 75 mg tid and continue imdur to 60 mg daily.  - EF is out of ICD range.   - Check BMET. 2. CKD: Stage 3, likely due to DM and HTN.   - BMET today.  3. HTN: Increase hydralazine as above. 4. OSA: Continue CPAP.   5. Type 2 diabetes: Dapagliflozin.  ? If recent penile symptoms due to yeast/excessive sweating. Counseled to keep an eye out for recurrence. 6. Hyperlipidemia: On atorvastatin.  - Good lipids in 12/21.  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    Follow up in 3 months with APP.   Anderson [facility] FNP 03/25/2021

## 2021-03-25 ENCOUNTER — Other Ambulatory Visit: Payer: Self-pay

## 2021-03-25 ENCOUNTER — Encounter (HOSPITAL_COMMUNITY): Payer: Self-pay

## 2021-03-25 ENCOUNTER — Ambulatory Visit (HOSPITAL_COMMUNITY)
Admission: RE | Admit: 2021-03-25 | Discharge: 2021-03-25 | Disposition: A | Discharge status: Home / Self Care | Payer: BC Managed Care – PPO | Source: Ambulatory Visit | Attending: Family Medicine | Admitting: Family Medicine

## 2021-03-25 VITALS — BP 138/89 | HR 77 | Wt 345.0 lb

## 2021-03-25 DIAGNOSIS — I130 Hypertensive heart and chronic kidney disease with heart failure and stage 1 through stage 4 chronic kidney disease, or unspecified chronic kidney disease: Secondary | ICD-10-CM | POA: Insufficient documentation

## 2021-03-25 DIAGNOSIS — G4733 Obstructive sleep apnea (adult) (pediatric): Secondary | ICD-10-CM

## 2021-03-25 DIAGNOSIS — Z7984 Long term (current) use of oral hypoglycemic drugs: Secondary | ICD-10-CM | POA: Diagnosis not present

## 2021-03-25 DIAGNOSIS — Z7982 Long term (current) use of aspirin: Secondary | ICD-10-CM | POA: Insufficient documentation

## 2021-03-25 DIAGNOSIS — I10 Essential (primary) hypertension: Secondary | ICD-10-CM

## 2021-03-25 DIAGNOSIS — I2510 Atherosclerotic heart disease of native coronary artery without angina pectoris: Secondary | ICD-10-CM | POA: Insufficient documentation

## 2021-03-25 DIAGNOSIS — E1122 Type 2 diabetes mellitus with diabetic chronic kidney disease: Secondary | ICD-10-CM

## 2021-03-25 DIAGNOSIS — I83891 Varicose veins of right lower extremities with other complications: Secondary | ICD-10-CM

## 2021-03-25 DIAGNOSIS — Z86718 Personal history of other venous thrombosis and embolism: Secondary | ICD-10-CM | POA: Insufficient documentation

## 2021-03-25 DIAGNOSIS — N183 Chronic kidney disease, stage 3 unspecified: Secondary | ICD-10-CM

## 2021-03-25 DIAGNOSIS — I428 Other cardiomyopathies: Secondary | ICD-10-CM | POA: Insufficient documentation

## 2021-03-25 DIAGNOSIS — I868 Varicose veins of other specified sites: Secondary | ICD-10-CM | POA: Diagnosis not present

## 2021-03-25 DIAGNOSIS — Z79899 Other long term (current) drug therapy: Secondary | ICD-10-CM | POA: Diagnosis not present

## 2021-03-25 DIAGNOSIS — E785 Hyperlipidemia, unspecified: Secondary | ICD-10-CM | POA: Diagnosis not present

## 2021-03-25 DIAGNOSIS — I5022 Chronic systolic (congestive) heart failure: Secondary | ICD-10-CM | POA: Diagnosis not present

## 2021-03-25 LAB — BASIC METABOLIC PANEL
Anion gap: 7 (ref 5–15)
BUN: 10 mg/dL (ref 6–20)
CO2: 26 mmol/L (ref 22–32)
Calcium: 9 mg/dL (ref 8.9–10.3)
Chloride: 101 mmol/L (ref 98–111)
Creatinine, Ser: 1.05 mg/dL (ref 0.61–1.24)
GFR, Estimated: >60 mL/min (ref >60)
Glucose, Bld: 212 mg/dL — ABNORMAL HIGH (ref 70–99)
Potassium: 4.3 mmol/L (ref 3.5–5.1)
Sodium: 134 mmol/L — ABNORMAL LOW (ref 135–145)

## 2021-03-25 MED ORDER — HYDRALAZINE HCL 25 MG PO TABS
75.0000 mg | ORAL_TABLET | Freq: Three times a day (TID) | ORAL | 3 refills | Status: DC
Start: 2021-03-25 — End: 2021-09-06

## 2021-03-25 NOTE — Patient Instructions (Signed)
INCREASE Hydralazine to 75 mg (3 tabs) three times per day  Labs today We will only contact you if something comes back abnormal or we need to make some changes. Otherwise no news is good news!  Your physician has requested that you regularly monitor and record your blood pressure readings at home. Please use the same machine at the same time of day to check your readings and record them to bring to your follow-up visit.   Your physician recommends that you schedule a follow-up appointment in: 3 months  in the Advanced Practitioners (PA/NP) Clinic    Do the following things EVERYDAY: Weigh yourself in the morning before breakfast. Write it down and keep it in a log. Take your medicines as prescribed Eat low salt foods--Limit salt (sodium) to 2000 mg per day.  Stay as active as you can everyday Limit all fluids for the day to less than 2 liters  At the [facility], you and your health needs are our priority. As part of our continuing mission to provide you with exceptional heart care, we have created designated Provider Care Teams. These Care Teams include your primary Cardiologist (physician) and Advanced Practice Providers (APPs- Physician Assistants and Nurse Practitioners) who all work together to provide you with the care you need, when you need it.   You may see any of the following providers on your designated Care Team at your next follow up: Dr Arvilla Meres Dr Marca Ancona Dr Brandon Melnick, NP Robbie Lis, [place] Mikki Santee Karle Plumber, PharmD   Please be sure to bring in all your medications bottles to every appointment.   If you have any questions or concerns before your next appointment please send [place] a message through [place] or call our office at [phone].    TO LEAVE A MESSAGE FOR THE NURSE SELECT OPTION 2, PLEASE LEAVE A MESSAGE INCLUDING: YOUR NAME DATE OF BIRTH CALL BACK NUMBER REASON FOR CALL**this is important  as we prioritize the call backs  YOU WILL RECEIVE A CALL BACK THE SAME DAY AS LONG AS YOU CALL BEFORE 4:00 PM

## 2021-03-30 ENCOUNTER — Other Ambulatory Visit (HOSPITAL_COMMUNITY): Payer: Self-pay

## 2021-04-02 ENCOUNTER — Other Ambulatory Visit (HOSPITAL_COMMUNITY): Payer: Self-pay | Admitting: Cardiology

## 2021-04-05 ENCOUNTER — Other Ambulatory Visit (HOSPITAL_COMMUNITY): Payer: Self-pay | Admitting: *Deleted

## 2021-04-05 MED ORDER — ENTRESTO 97-103 MG PO TABS: 1.0000 | ORAL_TABLET | Freq: Two times a day (BID) | ORAL | 3 refills | Status: DC

## 2021-04-06 ENCOUNTER — Other Ambulatory Visit (HOSPITAL_COMMUNITY): Payer: Self-pay | Admitting: *Deleted

## 2021-04-06 ENCOUNTER — Other Ambulatory Visit (HOSPITAL_COMMUNITY): Payer: Self-pay

## 2021-04-06 MED ORDER — ENTRESTO 97-103 MG PO TABS: 1.0000 | ORAL_TABLET | Freq: Two times a day (BID) | ORAL | 3 refills | Status: DC

## 2021-04-14 ENCOUNTER — Other Ambulatory Visit (HOSPITAL_COMMUNITY): Payer: Self-pay | Admitting: Cardiology

## 2021-04-14 ENCOUNTER — Other Ambulatory Visit (HOSPITAL_COMMUNITY): Payer: Self-pay

## 2021-04-14 MED ORDER — FUROSEMIDE 40 MG PO TABS
40.0000 mg | ORAL_TABLET | Freq: Every day | ORAL | 0 refills | Status: DC
  Filled 2021-04-14: qty 30, 30d supply, fill #0
  Filled 2021-05-17: qty 30, 30d supply, fill #1
  Filled 2021-06-24: qty 30, 30d supply, fill #2

## 2021-04-15 ENCOUNTER — Other Ambulatory Visit (HOSPITAL_COMMUNITY): Payer: Self-pay

## 2021-04-26 ENCOUNTER — Other Ambulatory Visit (HOSPITAL_COMMUNITY): Payer: Self-pay

## 2021-05-17 ENCOUNTER — Other Ambulatory Visit (HOSPITAL_COMMUNITY): Payer: Self-pay

## 2021-06-01 ENCOUNTER — Other Ambulatory Visit (HOSPITAL_COMMUNITY): Payer: Self-pay

## 2021-06-24 ENCOUNTER — Other Ambulatory Visit (HOSPITAL_COMMUNITY): Payer: Self-pay

## 2021-06-27 ENCOUNTER — Encounter (HOSPITAL_COMMUNITY): Payer: BC Managed Care – PPO

## 2021-07-05 ENCOUNTER — Other Ambulatory Visit (HOSPITAL_COMMUNITY): Payer: Self-pay

## 2021-07-14 ENCOUNTER — Encounter (HOSPITAL_COMMUNITY): Payer: Self-pay

## 2021-07-14 ENCOUNTER — Other Ambulatory Visit: Payer: Self-pay

## 2021-07-14 ENCOUNTER — Ambulatory Visit (HOSPITAL_COMMUNITY)
Admission: RE | Admit: 2021-07-14 | Discharge: 2021-07-14 | Disposition: A | Discharge status: Home / Self Care | Payer: BC Managed Care – PPO | Source: Ambulatory Visit | Attending: Cardiology | Admitting: Cardiology

## 2021-07-14 VITALS — BP 148/98 | HR 83 | Ht 72.0 in | Wt 350.4 lb

## 2021-07-14 DIAGNOSIS — Z8249 Family history of ischemic heart disease and other diseases of the circulatory system: Secondary | ICD-10-CM | POA: Insufficient documentation

## 2021-07-14 DIAGNOSIS — G4733 Obstructive sleep apnea (adult) (pediatric): Secondary | ICD-10-CM | POA: Insufficient documentation

## 2021-07-14 DIAGNOSIS — E785 Hyperlipidemia, unspecified: Secondary | ICD-10-CM | POA: Insufficient documentation

## 2021-07-14 DIAGNOSIS — E1122 Type 2 diabetes mellitus with diabetic chronic kidney disease: Secondary | ICD-10-CM | POA: Diagnosis not present

## 2021-07-14 DIAGNOSIS — Z7985 Long-term (current) use of injectable non-insulin antidiabetic drugs: Secondary | ICD-10-CM | POA: Insufficient documentation

## 2021-07-14 DIAGNOSIS — I5022 Chronic systolic (congestive) heart failure: Secondary | ICD-10-CM | POA: Insufficient documentation

## 2021-07-14 DIAGNOSIS — I2510 Atherosclerotic heart disease of native coronary artery without angina pectoris: Secondary | ICD-10-CM | POA: Insufficient documentation

## 2021-07-14 DIAGNOSIS — E669 Obesity, unspecified: Secondary | ICD-10-CM | POA: Insufficient documentation

## 2021-07-14 DIAGNOSIS — Z7982 Long term (current) use of aspirin: Secondary | ICD-10-CM | POA: Diagnosis not present

## 2021-07-14 DIAGNOSIS — Z6841 Body Mass Index (BMI) 40.0 and over, adult: Secondary | ICD-10-CM | POA: Diagnosis not present

## 2021-07-14 DIAGNOSIS — I130 Hypertensive heart and chronic kidney disease with heart failure and stage 1 through stage 4 chronic kidney disease, or unspecified chronic kidney disease: Secondary | ICD-10-CM | POA: Insufficient documentation

## 2021-07-14 DIAGNOSIS — I428 Other cardiomyopathies: Secondary | ICD-10-CM | POA: Insufficient documentation

## 2021-07-14 DIAGNOSIS — Z7984 Long term (current) use of oral hypoglycemic drugs: Secondary | ICD-10-CM | POA: Diagnosis not present

## 2021-07-14 DIAGNOSIS — Z79899 Other long term (current) drug therapy: Secondary | ICD-10-CM | POA: Diagnosis not present

## 2021-07-14 DIAGNOSIS — N183 Chronic kidney disease, stage 3 unspecified: Secondary | ICD-10-CM | POA: Diagnosis not present

## 2021-07-14 LAB — COMPREHENSIVE METABOLIC PANEL
ALT: 19 U/L (ref 0–44)
AST: 18 U/L (ref 15–41)
Albumin: 3.7 g/dL (ref 3.5–5.0)
Alkaline Phosphatase: 90 U/L (ref 38–126)
Anion gap: 6 (ref 5–15)
BUN: 10 mg/dL (ref 6–20)
CO2: 27 mmol/L (ref 22–32)
Calcium: 9.1 mg/dL (ref 8.9–10.3)
Chloride: 104 mmol/L (ref 98–111)
Creatinine, Ser: 1.28 mg/dL — ABNORMAL HIGH (ref 0.61–1.24)
GFR, Estimated: >60 mL/min (ref >60)
Glucose, Bld: 180 mg/dL — ABNORMAL HIGH (ref 70–99)
Potassium: 4.2 mmol/L (ref 3.5–5.1)
Sodium: 137 mmol/L (ref 135–145)
Total Bilirubin: 0.6 mg/dL (ref 0.3–1.2)
Total Protein: 7 g/dL (ref 6.5–8.1)

## 2021-07-14 LAB — LIPID PANEL
Cholesterol: 111 mg/dL (ref 0–200)
HDL: 41 mg/dL (ref >40)
LDL Cholesterol: 53 mg/dL (ref 0–99)
Total CHOL/HDL Ratio: 2.7 RATIO
Triglycerides: 85 mg/dL (ref <150)
VLDL: 17 mg/dL (ref 0–40)

## 2021-07-14 LAB — BRAIN NATRIURETIC PEPTIDE: B Natriuretic Peptide: 12.7 pg/mL (ref 0.0–100.0)

## 2021-07-14 NOTE — Patient Instructions (Signed)
It was great to see you today! No medication changes are needed at this time.  Labs today We will only contact you if something comes back abnormal or we need to make some changes. Otherwise no news is good news!   Your physician recommends that you schedule a follow-up appointment in: 3-4 months with Dr Shirlee Latch   Do the following things EVERYDAY: Weigh yourself in the morning before breakfast. Write it down and keep it in a log. Take your medicines as prescribed Eat low salt foods--Limit salt (sodium) to 2000 mg per day.  Stay as active as you can everyday Limit all fluids for the day to less than 2 liters  At the [facility], you and your health needs are our priority. As part of our continuing mission to provide you with exceptional heart care, we have created designated Provider Care Teams. These Care Teams include your primary Cardiologist (physician) and Advanced Practice Providers (APPs- Physician Assistants and Nurse Practitioners) who all work together to provide you with the care you need, when you need it.   You may see any of the following providers on your designated Care Team at your next follow up: Dr Arvilla Meres Dr Carron Curie, NP Robbie Lis, [place] [facility] [place], [place] Karle Plumber, PharmD   Please be sure to bring in all your medications bottles to every appointment.

## 2021-07-14 NOTE — Progress Notes (Signed)
[facility] PCP: Laurey Morale, MD HF Cardiology: Dr. Shirlee Latch  50 y.o. with history of HTN, DM, OSA on CPAP, and nonischemic cardiomyopathy was referred by Tereso Newcomer for evaluation of CHF.  Patient was admitted in 11/20 with dyspnea and leg swelling. He was found to have CHF.  Echo in 11/20 showed EF 20-25%.  LHC/RHC showed elevated filling pressures, CI 2.0, and mild nonobstructive CAD. He was started on a regimen of cardiac meds.  Cardiac MRI in 1/21 showed EF still low at 21% with severely dilated LV.  There was no late gadolinium enhancement.    Echo in 7/21 showed EF 30-35%, normal RV.   10/2020 Echo reviewed by Dr McLean--> EF up to 40-45%.   Last F/u 7/22, BP was elevated and hydral increased to 75 mg tid and he tolerated ok but ended up stopping Jardiance due to GU infections. Lasix was then increased back to 40 mg daily.   Returns for 3 month f/u. Here w/ wife. Doing fairly well symptomatically, chronically NYHA Class II. He has had recent wt gain but attributes this to increase caloric intake and being less active. Admits to dietary indiscretion w/ sodium. Euvolemic on exam. BNP checked in clinic today and normal at 12. Fully compliant w/ medications. BP mildly elevated today but has not yet taken AM meds, just got off work (works 3rd shift).    Labs (11/20): K 5.1, creatinine 1.56 Labs (1/21): creatinine 1.38 Labs (3/21): K 4.6, creatinine 1.22 Labs (4/21): K 4.7, creatinine 1.42 Labs (7/21): K 4.2, creatinine 1.39 Labs (12/21): K 4.3, creatinine 1.31, LDL 34 Labs (10/2020): K 4.4 Creatinine 1.28  Labs (7/22): K 4.3, creatinine 1.05  PMH: 1. Type 2 diabetes 2. HTN 3. Hyperlipidemia 4. OSA: Uses CPAP.  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  6. CKD stage 3 7. Chronic systolic CHF: Nonischemic cardiomyopathy.  - Echo (11/20): EF 20-25%, mild-moderate MR.  - LHC (11/20): Nonobstructive CAD; mean RA 10, PA 60/30 mean 40, mean  PCWP 27, CI 2.0.  - Cardiac MRI (1/21): Severe LV enlargement with EF 21%, global hypokinesis, RV EF 33%, no LGE.  - Echo (7/21): EF 30-35%, normal RV.  - Echo (2/22): EF 40-45%, normal RV.   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  No ETOH, no smoking.   FH: Mother with "heart issues," died when he was born.  No other known heart disease.   ROS: All systems reviewed and negative except as per HPI.   Current Outpatient Medications  Medication Sig Dispense Refill   aspirin 81 MG chewable tablet Chew 81 mg by mouth daily.     atorvastatin (LIPITOR) 40 MG tablet Take 1 tablet (40 mg total) by mouth at bedtime. 30 tablet 11   carvedilol (COREG) 25 MG tablet Take 1 tablet (25 mg total) by mouth 2 (two) times daily. 60 tablet 11   Dulaglutide (TRULICITY) 1.5 MG/0.5ML SOPN Inject 1.5 mg into the skin once a week.     ENTRESTO 97-103 MG TAKE ONE TABLET BY MOUTH TWICE A DAY 60 tablet 6   furosemide (LASIX) 40 MG tablet Take 1 tablet (40 mg total) by mouth daily. 90 tablet 0   glipiZIDE (GLUCOTROL XL) 10 MG 24 hr tablet Take 10 mg by mouth daily with breakfast.     hydrALAZINE (APRESOLINE) 25 MG tablet Take 3 tablets (75 mg total) by mouth 3 (three) times daily. 270  tablet 3   isosorbide mononitrate (IMDUR) 30 MG 24 hr tablet Take 30 mg by mouth daily.     metFORMIN (GLUCOPHAGE) 1000 MG tablet Take 1,000 mg by mouth 2 (two) times daily with a meal.      Multiple Vitamin (MULTIVITAMIN WITH MINERALS) TABS tablet Take 1 tablet by mouth daily.     spironolactone (ALDACTONE) 25 MG tablet Take 1 tablet (25 mg total) by mouth daily. 30 tablet 8   No current facility-administered medications for this encounter.   BP (!) 148/98   Pulse 83   Ht 6' (1.829 m)   Wt (!) 158.9 kg (350 lb 6.4 oz)   SpO2 99%   BMI 47.52 kg/m    Wt Readings from Last 3 Encounters:  07/14/21 (!) 158.9 kg (350 lb 6.4 oz)  03/25/21 (!) 156.5 kg (345 lb)  02/01/21 (!) 152.9 kg (337 lb)   PHYSICAL  EXAM: General:  Well appearing, moderately obese. No respiratory difficulty HEENT: normal Neck: supple. no JVD. Carotids 2+ bilat; no bruits. No lymphadenopathy or thyromegaly appreciated. Cor: PMI nondisplaced. Regular rate & rhythm. No rubs, gallops or murmurs. Lungs: clear Abdomen: soft, nontender, nondistended. No hepatosplenomegaly. No bruits or masses. Good bowel sounds. Extremities: no cyanosis, clubbing, rash, edema Neuro: alert & oriented x 3, cranial nerves grossly intact. moves all 4 extremities w/o difficulty. Affect pleasant.  Assessment/Plan: 1. Chronic systolic CHF:  Nonischemic cardiomyopathy. No definite family history of cardiomyopathy.  No ETOH or drugs. Cath with nonobstructive disease in 11/20.  Cardiac MRI in 1/21 showed EF 21% and severe LV dilation, no LGE. Possible viral myocarditis versus diabetic cardiomyopathy.  BP does not seem to have been markedly high in the past so doubt that HTN explains CMP.  Echo 2/20222 showed EF up to 40-45%.  - NYHA II. Volume status good. Euvolemic on exam. BNP normal at 12.  - Continue Entresto 97/103 bid.  - Continue Lasix 40 mg daily.   - Continue Coreg 25 mg bid.  - Continue spironolactone 25 mg daily.  - Off dapagliflozin due to GU infections.  - Continue hydralazine 75 mg tid  - Continue Imdur 60 mg daily.  - EF is out of ICD range.   - Check BMET. - Discussed low sodium diet  2. CKD: Stage 3, likely due to DM and HTN.  Baseline SCr ~1.0 - Check BMP today   3. HTN: controlled on current regimen  4. OSA: Reports nightly compliance w/ CPAP  5. Type 2 diabetes: followed by PCP. Off Dapagliflozin due to GU infections   6. Hyperlipidemia: On atorvastatin.  - Good lipids in 12/21. LDL 34 mg/dL  - Update LP and check HFTs today  7. Obesity: Body mass index is 47.52 kg/m., discussed portion control and recommended increasing physical activity.    Follow up w/ Dr. Shirlee Latch in 4 months   Gwynne Kemnitz Sharol Harness PA-C  07/14/2021

## 2021-07-28 ENCOUNTER — Other Ambulatory Visit (HOSPITAL_COMMUNITY): Payer: Self-pay

## 2021-07-28 ENCOUNTER — Other Ambulatory Visit (HOSPITAL_COMMUNITY): Payer: Self-pay | Admitting: Cardiology

## 2021-07-28 MED ORDER — FUROSEMIDE 40 MG PO TABS
40.0000 mg | ORAL_TABLET | Freq: Every day | ORAL | 3 refills | Status: DC
  Filled 2021-07-28: qty 30, 30d supply, fill #0
  Filled 2021-08-25: qty 30, 30d supply, fill #1
  Filled 2021-09-28: qty 30, 30d supply, fill #2
  Filled 2021-10-31: qty 30, 30d supply, fill #3
  Filled 2021-11-28: qty 30, 30d supply, fill #4
  Filled 2021-12-30: qty 30, 30d supply, fill #5

## 2021-08-08 ENCOUNTER — Other Ambulatory Visit (HOSPITAL_COMMUNITY): Payer: Self-pay

## 2021-08-16 ENCOUNTER — Other Ambulatory Visit (HOSPITAL_COMMUNITY): Payer: Self-pay

## 2021-08-25 ENCOUNTER — Other Ambulatory Visit (HOSPITAL_COMMUNITY): Payer: Self-pay

## 2021-09-05 ENCOUNTER — Other Ambulatory Visit (HOSPITAL_COMMUNITY): Payer: Self-pay

## 2021-09-05 ENCOUNTER — Other Ambulatory Visit (HOSPITAL_COMMUNITY): Payer: Self-pay | Admitting: Family Medicine

## 2021-09-14 ENCOUNTER — Other Ambulatory Visit (HOSPITAL_COMMUNITY): Payer: Self-pay

## 2021-09-14 ENCOUNTER — Other Ambulatory Visit (HOSPITAL_COMMUNITY): Payer: Self-pay | Admitting: Cardiology

## 2021-09-15 ENCOUNTER — Other Ambulatory Visit (HOSPITAL_COMMUNITY): Payer: Self-pay

## 2021-09-28 ENCOUNTER — Other Ambulatory Visit (HOSPITAL_COMMUNITY): Payer: Self-pay

## 2021-10-12 ENCOUNTER — Other Ambulatory Visit (HOSPITAL_COMMUNITY): Payer: Self-pay

## 2021-10-31 ENCOUNTER — Other Ambulatory Visit (HOSPITAL_COMMUNITY): Payer: Self-pay

## 2021-11-14 ENCOUNTER — Encounter (HOSPITAL_COMMUNITY): Payer: BC Managed Care – PPO | Admitting: Cardiology

## 2021-11-15 ENCOUNTER — Ambulatory Visit (HOSPITAL_COMMUNITY)
Admission: RE | Admit: 2021-11-15 | Discharge: 2021-11-15 | Disposition: A | Discharge status: Home / Self Care | Payer: BC Managed Care – PPO | Source: Ambulatory Visit | Attending: Cardiology | Admitting: Cardiology

## 2021-11-15 ENCOUNTER — Other Ambulatory Visit (HOSPITAL_COMMUNITY): Payer: Self-pay | Admitting: Cardiology

## 2021-11-15 ENCOUNTER — Other Ambulatory Visit: Payer: Self-pay

## 2021-11-15 ENCOUNTER — Other Ambulatory Visit (HOSPITAL_COMMUNITY): Payer: Self-pay

## 2021-11-15 VITALS — BP 119/70 | HR 86 | Wt 337.0 lb

## 2021-11-15 DIAGNOSIS — I130 Hypertensive heart and chronic kidney disease with heart failure and stage 1 through stage 4 chronic kidney disease, or unspecified chronic kidney disease: Secondary | ICD-10-CM | POA: Insufficient documentation

## 2021-11-15 DIAGNOSIS — Z7985 Long-term (current) use of injectable non-insulin antidiabetic drugs: Secondary | ICD-10-CM | POA: Diagnosis not present

## 2021-11-15 DIAGNOSIS — G4733 Obstructive sleep apnea (adult) (pediatric): Secondary | ICD-10-CM | POA: Insufficient documentation

## 2021-11-15 DIAGNOSIS — N183 Chronic kidney disease, stage 3 unspecified: Secondary | ICD-10-CM | POA: Diagnosis not present

## 2021-11-15 DIAGNOSIS — I5022 Chronic systolic (congestive) heart failure: Secondary | ICD-10-CM

## 2021-11-15 DIAGNOSIS — E1122 Type 2 diabetes mellitus with diabetic chronic kidney disease: Secondary | ICD-10-CM | POA: Insufficient documentation

## 2021-11-15 DIAGNOSIS — E785 Hyperlipidemia, unspecified: Secondary | ICD-10-CM | POA: Diagnosis not present

## 2021-11-15 DIAGNOSIS — I428 Other cardiomyopathies: Secondary | ICD-10-CM | POA: Insufficient documentation

## 2021-11-15 DIAGNOSIS — M7989 Other specified soft tissue disorders: Secondary | ICD-10-CM | POA: Insufficient documentation

## 2021-11-15 DIAGNOSIS — Z7984 Long term (current) use of oral hypoglycemic drugs: Secondary | ICD-10-CM | POA: Insufficient documentation

## 2021-11-15 DIAGNOSIS — I2510 Atherosclerotic heart disease of native coronary artery without angina pectoris: Secondary | ICD-10-CM | POA: Insufficient documentation

## 2021-11-15 DIAGNOSIS — Z79899 Other long term (current) drug therapy: Secondary | ICD-10-CM | POA: Diagnosis not present

## 2021-11-15 LAB — BASIC METABOLIC PANEL
Anion gap: 8 (ref 5–15)
BUN: 11 mg/dL (ref 6–20)
CO2: 27 mmol/L (ref 22–32)
Calcium: 9.2 mg/dL (ref 8.9–10.3)
Chloride: 101 mmol/L (ref 98–111)
Creatinine, Ser: 1.18 mg/dL (ref 0.61–1.24)
GFR, Estimated: >60 mL/min (ref >60)
Glucose, Bld: 175 mg/dL — ABNORMAL HIGH (ref 70–99)
Potassium: 3.6 mmol/L (ref 3.5–5.1)
Sodium: 136 mmol/L (ref 135–145)

## 2021-11-15 MED ORDER — ISOSORBIDE MONONITRATE ER 30 MG PO TB24: 60.0000 mg | ORAL_TABLET | Freq: Every day | ORAL | 6 refills | Status: DC

## 2021-11-15 MED ORDER — SPIRONOLACTONE 25 MG PO TABS
25.0000 mg | ORAL_TABLET | Freq: Every day | ORAL | 8 refills | Status: DC
  Filled 2021-11-15: qty 30, 30d supply, fill #0
  Filled 2021-12-22: qty 30, 30d supply, fill #1
  Filled 2022-01-23: qty 30, 30d supply, fill #2
  Filled 2022-02-28: qty 30, 30d supply, fill #3
  Filled 2022-03-27 (×2): qty 30, 30d supply, fill #4
  Filled 2022-05-02: qty 30, 30d supply, fill #5
  Filled 2022-06-13: qty 30, 30d supply, fill #6
  Filled 2022-07-12: qty 30, 30d supply, fill #7
  Filled 2022-08-13: qty 30, 30d supply, fill #8

## 2021-11-15 NOTE — Patient Instructions (Signed)
Increase Imdur to 60 mg daily.  Labs done today, your results will be available in MyChart, we will contact you for abnormal readings.   Your physician has requested that you have an echocardiogram. Echocardiography is a painless test that uses sound waves to create images of your heart. It provides your doctor with information about the size and shape of your heart and how well your hearts chambers and valves are working. This procedure takes approximately one hour. There are no restrictions for this procedure.   Your physician recommends that you schedule a follow-up appointment in: 4 months   If you have any questions or concerns before your next appointment please send [place] a message through [place] or call our office at [phone].    TO LEAVE A MESSAGE FOR THE NURSE SELECT OPTION 2, PLEASE LEAVE A MESSAGE INCLUDING: YOUR NAME DATE OF BIRTH CALL BACK NUMBER REASON FOR CALL**this is important as we prioritize the call backs  YOU WILL RECEIVE A CALL BACK THE SAME DAY AS LONG AS YOU CALL BEFORE 4:00 PM  At the [facility], you and your health needs are our priority. As part of our continuing mission to provide you with exceptional heart care, we have created designated Provider Care Teams. These Care Teams include your primary Cardiologist (physician) and Advanced Practice Providers (APPs- Physician Assistants and Nurse Practitioners) who all work together to provide you with the care you need, when you need it.   You may see any of the following providers on your designated Care Team at your next follow up: Dr Glori Bickers Dr Haynes Kerns, NP Lyda Jester, [place] [facility] [place], [place] Audry Riles, PharmD   Please be sure to bring in all your medications bottles to every appointment.

## 2021-11-16 NOTE — Progress Notes (Signed)
PCP: Larey Dresser, MD HF Cardiology: Dr. Aundra Dubin  51 y.o. with history of HTN, DM, OSA on CPAP, and nonischemic cardiomyopathy was referred by Richardson Dopp for evaluation of CHF.  Patient was admitted in 11/20 with dyspnea and leg swelling. He was found to have CHF.  Echo in 11/20 showed EF 20-25%.  LHC/RHC showed elevated filling pressures, CI 2.0, and mild nonobstructive CAD. He was started on a regimen of cardiac meds.  Cardiac MRI in 1/21 showed EF still low at 21% with severely dilated LV.  There was no late gadolinium enhancement.    Echo in 7/21 showed EF 30-35%, normal RV. Echo in 2/22 showed EF up to 40-45%.    Patient returns for followup of CHF.  Doing well overall, working 90 hrs/week at 2 jobs.  Wife is waiting for renal transplant.  No significant exertional dyspnea or chest pain.  Weight is down about 13 lbs since last appointment.  No lightheadedness. No orthopnea/PND. 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    Labs (11/20): K 5.1, creatinine 1.56 Labs (1/21): creatinine 1.38 Labs (3/21): K 4.6, creatinine 1.22 Labs (4/21): K 4.7, creatinine 1.42 Labs (7/21): K 4.2, creatinine 1.39 Labs (12/21): K 4.3, creatinine 1.31, LDL 34 Labs (10/22): BNP 12.7, LDL 53, K 4.2, creatinine 1.28  PMH: 1. Type 2 diabetes 2. HTN 3. Hyperlipidemia 4. OSA: Uses CPAP.  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  6. CKD stage 3 7. Chronic systolic CHF: Nonischemic cardiomyopathy.  - Echo (11/20): EF 20-25%, mild-moderate MR.  - LHC (11/20): Nonobstructive CAD; mean RA 10, PA 60/30 mean 40, mean PCWP 27, CI 2.0.  - Cardiac MRI (1/21): Severe LV enlargement with EF 21%, global hypokinesis, RV EF 33%, no LGE.  - Echo (7/21): EF 30-35%, normal RV.  - Echo (2/22): EF 40-45%, normal RV.   SH: Married, works as Administrator.  Used to play arena football.  No ETOH, no smoking.   FH: Mother with "heart issues," died when he was born.  No other known heart disease.    ROS: All systems reviewed and negative except as per HPI.   Current Outpatient Medications  Medication Sig Dispense Refill   aspirin 81 MG chewable tablet Chew 81 mg by mouth daily.     atorvastatin (LIPITOR) 40 MG tablet Take 1 tablet (40 mg total) by mouth at bedtime. 30 tablet 11   carvedilol (COREG) 25 MG tablet Take 1 tablet (25 mg total) by mouth 2 (two) times daily. 60 tablet 11   Dulaglutide (TRULICITY) 1.5 0000000 SOPN Inject 1.5 mg into the skin once a week.     ENTRESTO 97-103 MG TAKE ONE TABLET BY MOUTH TWICE A DAY 60 tablet 6   furosemide (LASIX) 40 MG tablet Take 1 tablet (40 mg total) by mouth daily. 90 tablet 3   glipiZIDE (GLUCOTROL XL) 10 MG 24 hr tablet Take 10 mg by mouth daily with breakfast.     hydrALAZINE (APRESOLINE) 25 MG tablet TAKE THREE TABLETS BY MOUTH THREE TIMES A DAY 270 tablet 3   metFORMIN (GLUCOPHAGE) 1000 MG tablet Take 1,000 mg by mouth 2 (two) times daily with a meal.      Multiple Vitamin (MULTIVITAMIN WITH MINERALS) TABS tablet Take 1 tablet by mouth daily.     isosorbide mononitrate (IMDUR) 30 MG 24 hr tablet Take 2 tablets (60 mg total) by mouth daily. 30 tablet 6   spironolactone (ALDACTONE) 25 MG  tablet Take 1 tablet (25 mg total) by mouth daily. 30 tablet 8   No current facility-administered medications for this encounter.   BP 119/70    Pulse 86    Wt (!) 152.9 kg (337 lb)    SpO2 97%    BMI 45.71 kg/m  General: NAD, obese.  Neck: No JVD, no thyromegaly or thyroid nodule.  Lungs: Clear to auscultation bilaterally with normal respiratory effort. CV: Nondisplaced PMI.  Heart regular S1/S2, no S3/S4, no murmur.  No peripheral edema.  No carotid bruit.  Normal pedal pulses. Right lower leg venous varicosities.  Abdomen: Soft, nontender, no hepatosplenomegaly, no distention.  Skin: Intact without lesions or rashes.  Neurologic: Alert and oriented x 3.  Psych: Normal affect. Extremities: No clubbing or cyanosis.  HEENT: Normal.    Assessment/Plan: 1. Chronic systolic CHF:  Nonischemic cardiomyopathy. No definite family history of cardiomyopathy.  No ETOH or drugs. Cath with nonobstructive disease in 11/20.  Cardiac MRI in 1/21 showed EF 21% and severe LV dilation, no LGE. Possible viral myocarditis versus diabetic cardiomyopathy.  BP does not seem to have been markedly high in the past so doubt that HTN explains CMP.  Echo in 2/22 showed EF up to 40-45%.  On exam today, he is euvolemic.  NYHA class I symptoms.  - Continue Entresto 97/103 bid.  - Continue Lasix 40 mg daily.  BMET today.  - Continue Coreg 25 mg bid.  - Continue spironolactone 25 mg daily.  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  - Continue hydralazine 75 mg tid and increase Imdur to 60 mg daily.  - EF is out of ICD range.   - Repeat echo at followup in 4 months.  2. CKD: Stage 3, likely due to DM and HTN.   - BMET today.  3. HTN: BP is controlled.  4. OSA: Continue to use CPAP.  5. Type 2 diabetes: Off Jardiance due to GU infections.   6. Hyperlipidemia: On atorvastatin.  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   Followup 4 months with echo  Loralie Champagne 11/16/2021

## 2021-11-28 ENCOUNTER — Other Ambulatory Visit (HOSPITAL_COMMUNITY): Payer: Self-pay

## 2021-12-22 ENCOUNTER — Other Ambulatory Visit (HOSPITAL_COMMUNITY): Payer: Self-pay

## 2021-12-30 ENCOUNTER — Other Ambulatory Visit (HOSPITAL_COMMUNITY): Payer: Self-pay

## 2022-01-13 ENCOUNTER — Other Ambulatory Visit (HOSPITAL_COMMUNITY): Payer: Self-pay

## 2022-01-13 ENCOUNTER — Other Ambulatory Visit (HOSPITAL_COMMUNITY): Payer: Self-pay | Admitting: Cardiology

## 2022-01-13 DIAGNOSIS — I5022 Chronic systolic (congestive) heart failure: Secondary | ICD-10-CM

## 2022-01-13 MED ORDER — CARVEDILOL 25 MG PO TABS
25.0000 mg | ORAL_TABLET | Freq: Two times a day (BID) | ORAL | 11 refills | Status: DC
  Filled 2022-01-13 – 2022-01-16 (×3): qty 60, 30d supply, fill #0
  Filled 2022-02-28: qty 60, 30d supply, fill #1
  Filled 2022-03-27: qty 60, 30d supply, fill #2
  Filled 2022-05-02: qty 60, 30d supply, fill #3
  Filled 2022-09-28: qty 60, 30d supply, fill #4
  Filled 2022-11-20: qty 60, 30d supply, fill #5
  Filled 2022-12-20: qty 60, 30d supply, fill #6

## 2022-01-16 ENCOUNTER — Other Ambulatory Visit (HOSPITAL_COMMUNITY): Payer: Self-pay

## 2022-01-18 ENCOUNTER — Other Ambulatory Visit (HOSPITAL_COMMUNITY): Payer: Self-pay | Admitting: Family Medicine

## 2022-01-23 ENCOUNTER — Other Ambulatory Visit (HOSPITAL_COMMUNITY): Payer: Self-pay

## 2022-01-27 ENCOUNTER — Encounter (HOSPITAL_BASED_OUTPATIENT_CLINIC_OR_DEPARTMENT_OTHER): Payer: Self-pay

## 2022-01-27 ENCOUNTER — Other Ambulatory Visit: Payer: Self-pay

## 2022-01-27 ENCOUNTER — Emergency Department (HOSPITAL_BASED_OUTPATIENT_CLINIC_OR_DEPARTMENT_OTHER)
Admission: EM | Admit: 2022-01-27 | Discharge: 2022-01-27 | Disposition: A | Discharge status: Home / Self Care | Payer: BC Managed Care – PPO | Attending: Emergency Medicine | Admitting: Emergency Medicine

## 2022-01-27 DIAGNOSIS — Y92410 Unspecified street and highway as the place of occurrence of the external cause: Secondary | ICD-10-CM | POA: Insufficient documentation

## 2022-01-27 DIAGNOSIS — M545 Low back pain, unspecified: Secondary | ICD-10-CM | POA: Diagnosis present

## 2022-01-27 DIAGNOSIS — S39012A Strain of muscle, fascia and tendon of lower back, initial encounter: Secondary | ICD-10-CM | POA: Diagnosis not present

## 2022-01-27 DIAGNOSIS — Z7982 Long term (current) use of aspirin: Secondary | ICD-10-CM | POA: Insufficient documentation

## 2022-01-27 MED ORDER — METHOCARBAMOL 500 MG PO TABS: 500.0000 mg | ORAL_TABLET | Freq: Two times a day (BID) | ORAL | 0 refills | Status: DC
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
## 2022-02-01 ENCOUNTER — Other Ambulatory Visit (HOSPITAL_COMMUNITY): Payer: Self-pay

## 2022-02-28 ENCOUNTER — Other Ambulatory Visit (HOSPITAL_COMMUNITY): Payer: Self-pay

## 2022-03-11 ENCOUNTER — Other Ambulatory Visit (HOSPITAL_COMMUNITY): Payer: Self-pay | Admitting: Cardiology

## 2022-03-15 ENCOUNTER — Encounter (HOSPITAL_COMMUNITY): Payer: Self-pay

## 2022-03-15 ENCOUNTER — Ambulatory Visit (HOSPITAL_BASED_OUTPATIENT_CLINIC_OR_DEPARTMENT_OTHER)
Admission: RE | Admit: 2022-03-15 | Discharge: 2022-03-15 | Disposition: A | Discharge status: Home / Self Care | Payer: BC Managed Care – PPO | Source: Ambulatory Visit

## 2022-03-15 ENCOUNTER — Ambulatory Visit (HOSPITAL_COMMUNITY)
Admission: RE | Admit: 2022-03-15 | Discharge: 2022-03-15 | Disposition: A | Discharge status: Home / Self Care | Payer: BC Managed Care – PPO | Source: Ambulatory Visit | Attending: Family Medicine | Admitting: Family Medicine

## 2022-03-15 VITALS — BP 138/94 | HR 84 | Wt 345.0 lb

## 2022-03-15 DIAGNOSIS — M7989 Other specified soft tissue disorders: Secondary | ICD-10-CM | POA: Insufficient documentation

## 2022-03-15 DIAGNOSIS — E785 Hyperlipidemia, unspecified: Secondary | ICD-10-CM

## 2022-03-15 DIAGNOSIS — E1122 Type 2 diabetes mellitus with diabetic chronic kidney disease: Secondary | ICD-10-CM

## 2022-03-15 DIAGNOSIS — N183 Chronic kidney disease, stage 3 unspecified: Secondary | ICD-10-CM | POA: Diagnosis not present

## 2022-03-15 DIAGNOSIS — I130 Hypertensive heart and chronic kidney disease with heart failure and stage 1 through stage 4 chronic kidney disease, or unspecified chronic kidney disease: Secondary | ICD-10-CM | POA: Diagnosis not present

## 2022-03-15 DIAGNOSIS — I428 Other cardiomyopathies: Secondary | ICD-10-CM | POA: Insufficient documentation

## 2022-03-15 DIAGNOSIS — Z79899 Other long term (current) drug therapy: Secondary | ICD-10-CM | POA: Diagnosis not present

## 2022-03-15 DIAGNOSIS — I5022 Chronic systolic (congestive) heart failure: Secondary | ICD-10-CM

## 2022-03-15 DIAGNOSIS — I83891 Varicose veins of right lower extremities with other complications: Secondary | ICD-10-CM

## 2022-03-15 DIAGNOSIS — G4733 Obstructive sleep apnea (adult) (pediatric): Secondary | ICD-10-CM | POA: Diagnosis not present

## 2022-03-15 DIAGNOSIS — I10 Essential (primary) hypertension: Secondary | ICD-10-CM

## 2022-03-15 DIAGNOSIS — I2510 Atherosclerotic heart disease of native coronary artery without angina pectoris: Secondary | ICD-10-CM | POA: Insufficient documentation

## 2022-03-15 LAB — BASIC METABOLIC PANEL
Anion gap: 6 (ref 5–15)
BUN: 12 mg/dL (ref 6–20)
CO2: 26 mmol/L (ref 22–32)
Calcium: 9.4 mg/dL (ref 8.9–10.3)
Chloride: 103 mmol/L (ref 98–111)
Creatinine, Ser: 1.17 mg/dL (ref 0.61–1.24)
GFR, Estimated: >60 mL/min (ref >60)
Glucose, Bld: 280 mg/dL — ABNORMAL HIGH (ref 70–99)
Potassium: 4.2 mmol/L (ref 3.5–5.1)
Sodium: 135 mmol/L (ref 135–145)

## 2022-03-15 LAB — ECHOCARDIOGRAM COMPLETE
Area-P 1/2: 4.6 cm2
Calc EF: 46.9 %
S' Lateral: 4 cm
Single Plane A2C EF: 46.1 %
Single Plane A4C EF: 48.3 %

## 2022-03-15 LAB — BRAIN NATRIURETIC PEPTIDE: B Natriuretic Peptide: 11.5 pg/mL (ref 0.0–100.0)

## 2022-03-15 NOTE — Progress Notes (Signed)
PCP: Laurey Morale, MD HF Cardiology: Dr. Shirlee Latch  51 y.o. with history of HTN, DM, OSA on CPAP, and nonischemic cardiomyopathy was referred by Tereso Newcomer for evaluation of CHF.  Patient was admitted in 11/20 with dyspnea and leg swelling. He was found to have CHF.  Echo in 11/20 showed EF 20-25%.  LHC/RHC showed elevated filling pressures, CI 2.0, and mild nonobstructive CAD. He was started on a regimen of cardiac meds.  Cardiac MRI in 1/21 showed EF still low at 21% with severely dilated LV.  There was no late gadolinium enhancement.    Echo in 7/21 showed EF 30-35%, normal RV. Echo in 2/22 showed EF up to 40-45%.    Follow up 2/23, stable NYHA I, repeat echo arranged.  Today he returns for HF follow up. Overall feeling fine. Has been off his meds x 1 week as he and his wife have spent the past week at [facility] for his wife's kidney transplant surgery. He does not have dyspnea with activity, just fatigued from the events of the past week. Denies palpitations, CP, dizziness, edema, or PND/Orthopnea. Appetite ok. No fever or chills. Weight at home 334 pounds. Echo today, results pending. He is working 90 hrs/week at 2 jobs. Wears CPAP nightly.  ECG (personally reviewed): NSR 83 bpm  Labs (11/20): K 5.1, creatinine 1.56 Labs (1/21): creatinine 1.38 Labs (3/21): K 4.6, creatinine 1.22 Labs (4/21): K 4.7, creatinine 1.42 Labs (7/21): K 4.2, creatinine 1.39 Labs (12/21): K 4.3, creatinine 1.31, LDL 34 Labs (10/22): BNP 12.7, LDL 53, K 4.2, creatinine 0.86 Labs (2/23): K 3.6, creatinine 1.18  PMH: 1. Type 2 diabetes 2. HTN 3. Hyperlipidemia 4. OSA: Uses CPAP.  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  6. CKD stage 3 7. Chronic systolic CHF: Nonischemic cardiomyopathy.  - Echo (11/20): EF 20-25%, mild-moderate MR.  - LHC (11/20): Nonobstructive CAD; mean RA 10, PA 60/30 mean 40, mean PCWP 27, CI 2.0.  - Cardiac MRI (1/21): Severe LV enlargement with EF 21%, global  hypokinesis, RV EF 33%, no LGE.  - Echo (7/21): EF 30-35%, normal RV.  - Echo (2/22): EF 40-45%, normal RV.   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  No ETOH, no smoking.   FH: Mother with "heart issues," died when he was born.  No other known heart disease.   ROS: All systems reviewed and negative except as per HPI.   Current Outpatient Medications  Medication Sig Dispense Refill   aspirin 81 MG chewable tablet Chew 81 mg by mouth daily.     atorvastatin (LIPITOR) 40 MG tablet Take 1 tablet (40 mg total) by mouth at bedtime. 30 tablet 11   carvedilol (COREG) 25 MG tablet Take 1 tablet (25 mg total) by mouth 2 (two) times daily. 60 tablet 11   Dulaglutide (TRULICITY) 1.5 MG/0.5ML SOPN Inject 1.5 mg into the skin once a week.     ENTRESTO 97-103 MG TAKE ONE TABLET BY MOUTH TWICE A DAY 60 tablet 6   furosemide (LASIX) 40 MG tablet Take 1 tablet (40 mg total) by mouth daily. 90 tablet 3   glipiZIDE (GLUCOTROL XL) 10 MG 24 hr tablet Take 10 mg by mouth daily with breakfast.     hydrALAZINE (APRESOLINE) 25 MG tablet TAKE THREE TABLETS BY MOUTH THREE TIMES DAILY 270 tablet 3   isosorbide mononitrate (IMDUR) 30 MG 24 hr tablet TAKE TWO TABLETS BY MOUTH DAILY 90  tablet 3   metFORMIN (GLUCOPHAGE) 1000 MG tablet Take 1,000 mg by mouth 2 (two) times daily with a meal.      methocarbamol (ROBAXIN) 500 MG tablet Take 1 tablet (500 mg total) by mouth 2 (two) times daily. 20 tablet 0   Multiple Vitamin (MULTIVITAMIN WITH MINERALS) TABS tablet Take 1 tablet by mouth daily.     spironolactone (ALDACTONE) 25 MG tablet Take 1 tablet (25 mg total) by mouth daily. 30 tablet 8   No current facility-administered medications for this encounter.   Wt Readings from Last 3 Encounters:  03/15/22 (!) 156.5 kg (345 lb)  01/27/22 (!) 147.9 kg (326 lb)  11/15/21 (!) 152.9 kg (337 lb)   BP (!) 138/94   Pulse 84   Wt (!) 156.5 kg (345 lb)   SpO2 98%   BMI 46.79 kg/m  Physical  Exam: General:  NAD. No resp difficulty HEENT: Normal Neck: Supple. Thick neck. Carotids 2+ bilat; no bruits. No lymphadenopathy or thryomegaly appreciated. Cor: PMI nondisplaced. Regular rate & rhythm. No rubs, gallops or murmurs. Lungs: Clear Abdomen: Obese, soft, nontender, nondistended. No hepatosplenomegaly. No bruits or masses. Good bowel sounds. Extremities: No cyanosis, clubbing, rash, edema; RLE varicosities Neuro: Alert & oriented x 3, cranial nerves grossly intact. Moves all 4 extremities w/o difficulty. Affect pleasant.  Assessment/Plan: 1. Chronic systolic CHF:  Nonischemic cardiomyopathy. No definite family history of cardiomyopathy.  No ETOH or drugs. Cath with nonobstructive disease in 11/20.  Cardiac MRI in 1/21 showed EF 21% and severe LV dilation, no LGE. Possible viral myocarditis versus diabetic cardiomyopathy.  BP does not seem to have been markedly high in the past so doubt that HTN explains CMP.  Echo in 2/22 showed EF up to 40-45%.  Echo today (03/15/22), results pending. On exam today, he is euvolemic, weight up 8 lbs however he has been off meds x 1 week and recently restarted.  NYHA class I-II symptoms.  - Continue Entresto 97/103 bid.  - Continue Lasix 40 mg daily.  BMET/BNP today.  - Continue Coreg 25 mg bid.  - Continue spironolactone 25 mg daily.  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  - Continue hydralazine 75 mg tid and Imdur 60 mg daily.  - EF is out of ICD range.   2. CKD: Stage 3, likely due to DM and HTN.   - BMET today.  3. HTN: Mildly elevated today but he has not had morning medications yet. 4. OSA: Continue to use CPAP.  5. Type 2 diabetes: Off Jardiance due to GU infections.   6. Hyperlipidemia: On atorvastatin.  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   Follow up in 4-5 months with Dr. Shirlee Latch.  [facility] FNP-BC 03/15/2022

## 2022-03-15 NOTE — Patient Instructions (Signed)
EKG done today.  Labs done today. We will contact you only if your labs are abnormal.  No medication changes were made. Please continue all current medications as prescribed.  Your physician recommends that you schedule a follow-up appointment in: 4-5 months with Dr.McLean. Please contact our office in September to schedule a October/November appointment.   If you have any questions or concerns before your next appointment please send [place] a message through [place] or call our office at [phone].    TO LEAVE A MESSAGE FOR THE NURSE SELECT OPTION 2, PLEASE LEAVE A MESSAGE INCLUDING: YOUR NAME DATE OF BIRTH CALL BACK NUMBER REASON FOR CALL**this is important as we prioritize the call backs  YOU WILL RECEIVE A CALL BACK THE SAME DAY AS LONG AS YOU CALL BEFORE 4:00 PM   Do the following things EVERYDAY: Weigh yourself in the morning before breakfast. Write it down and keep it in a log. Take your medicines as prescribed Eat low salt foods--Limit salt (sodium) to 2000 mg per day.  Stay as active as you can everyday Limit all fluids for the day to less than 2 liters   At the [facility], you and your health needs are our priority. As part of our continuing mission to provide you with exceptional heart care, we have created designated Provider Care Teams. These Care Teams include your primary Cardiologist (physician) and Advanced Practice Providers (APPs- Physician Assistants and Nurse Practitioners) who all work together to provide you with the care you need, when you need it.   You may see any of the following providers on your designated Care Team at your next follow up: Dr Arvilla Meres Dr Carron Curie, NP Robbie Lis, [place] Karle Plumber, PharmD   Please be sure to bring in all your medications bottles to every appointment.

## 2022-03-15 NOTE — Progress Notes (Signed)
  Echocardiogram 2D Echocardiogram has been performed.  Augustine Radar 03/15/2022, 10:38 AM

## 2022-03-27 ENCOUNTER — Other Ambulatory Visit (HOSPITAL_COMMUNITY): Payer: Self-pay

## 2022-03-30 ENCOUNTER — Other Ambulatory Visit (HOSPITAL_COMMUNITY): Payer: Self-pay

## 2022-04-06 ENCOUNTER — Other Ambulatory Visit (HOSPITAL_COMMUNITY): Payer: Self-pay

## 2022-04-20 ENCOUNTER — Other Ambulatory Visit (HOSPITAL_COMMUNITY): Payer: Self-pay

## 2022-04-25 ENCOUNTER — Other Ambulatory Visit (HOSPITAL_COMMUNITY): Payer: Self-pay | Admitting: Cardiology

## 2022-05-02 ENCOUNTER — Other Ambulatory Visit (HOSPITAL_COMMUNITY): Payer: Self-pay

## 2022-06-11 ENCOUNTER — Other Ambulatory Visit (HOSPITAL_COMMUNITY): Payer: Self-pay | Admitting: Cardiology

## 2022-06-13 ENCOUNTER — Other Ambulatory Visit (HOSPITAL_COMMUNITY): Payer: Self-pay

## 2022-07-13 ENCOUNTER — Other Ambulatory Visit (HOSPITAL_COMMUNITY): Payer: Self-pay

## 2022-08-14 ENCOUNTER — Other Ambulatory Visit (HOSPITAL_COMMUNITY): Payer: Self-pay

## 2022-09-21 ENCOUNTER — Other Ambulatory Visit (HOSPITAL_COMMUNITY): Payer: Self-pay | Admitting: Cardiology

## 2022-09-22 ENCOUNTER — Other Ambulatory Visit (HOSPITAL_COMMUNITY): Payer: Self-pay

## 2022-09-22 MED ORDER — SPIRONOLACTONE 25 MG PO TABS
25.0000 mg | ORAL_TABLET | Freq: Every day | ORAL | 8 refills | Status: DC
  Filled 2022-09-22: qty 30, 30d supply, fill #0
  Filled 2022-10-20: qty 30, 30d supply, fill #1
  Filled 2022-11-20: qty 30, 30d supply, fill #2
  Filled 2022-12-20: qty 30, 30d supply, fill #3
  Filled 2023-01-25: qty 30, 30d supply, fill #4
  Filled 2023-02-28: qty 30, 30d supply, fill #5
  Filled 2023-04-03: qty 30, 30d supply, fill #6
  Filled 2023-04-30: qty 30, 30d supply, fill #7

## 2022-10-04 ENCOUNTER — Encounter (HOSPITAL_COMMUNITY): Payer: Self-pay | Admitting: Cardiology

## 2022-10-04 ENCOUNTER — Ambulatory Visit (HOSPITAL_COMMUNITY)
Admission: RE | Admit: 2022-10-04 | Discharge: 2022-10-04 | Disposition: A | Discharge status: Home / Self Care | Payer: 59 | Source: Ambulatory Visit | Attending: Cardiology | Admitting: Cardiology

## 2022-10-04 VITALS — BP 120/70 | HR 100 | Wt 352.4 lb

## 2022-10-04 DIAGNOSIS — I428 Other cardiomyopathies: Secondary | ICD-10-CM | POA: Insufficient documentation

## 2022-10-04 DIAGNOSIS — G4733 Obstructive sleep apnea (adult) (pediatric): Secondary | ICD-10-CM | POA: Insufficient documentation

## 2022-10-04 DIAGNOSIS — Z79899 Other long term (current) drug therapy: Secondary | ICD-10-CM | POA: Insufficient documentation

## 2022-10-04 DIAGNOSIS — E1122 Type 2 diabetes mellitus with diabetic chronic kidney disease: Secondary | ICD-10-CM | POA: Diagnosis not present

## 2022-10-04 DIAGNOSIS — I130 Hypertensive heart and chronic kidney disease with heart failure and stage 1 through stage 4 chronic kidney disease, or unspecified chronic kidney disease: Secondary | ICD-10-CM | POA: Insufficient documentation

## 2022-10-04 DIAGNOSIS — I5022 Chronic systolic (congestive) heart failure: Secondary | ICD-10-CM | POA: Insufficient documentation

## 2022-10-04 DIAGNOSIS — M7989 Other specified soft tissue disorders: Secondary | ICD-10-CM | POA: Diagnosis not present

## 2022-10-04 DIAGNOSIS — E785 Hyperlipidemia, unspecified: Secondary | ICD-10-CM | POA: Insufficient documentation

## 2022-10-04 DIAGNOSIS — N183 Chronic kidney disease, stage 3 unspecified: Secondary | ICD-10-CM | POA: Diagnosis not present

## 2022-10-04 LAB — BASIC METABOLIC PANEL
Anion gap: 8 (ref 5–15)
BUN: 10 mg/dL (ref 6–20)
CO2: 25 mmol/L (ref 22–32)
Calcium: 9.1 mg/dL (ref 8.9–10.3)
Chloride: 100 mmol/L (ref 98–111)
Creatinine, Ser: 1.19 mg/dL (ref 0.61–1.24)
GFR, Estimated: >60 mL/min (ref >60)
Glucose, Bld: 409 mg/dL — ABNORMAL HIGH (ref 70–99)
Potassium: 4.2 mmol/L (ref 3.5–5.1)
Sodium: 133 mmol/L — ABNORMAL LOW (ref 135–145)

## 2022-10-04 LAB — BRAIN NATRIURETIC PEPTIDE: B Natriuretic Peptide: 10.3 pg/mL (ref 0.0–100.0)

## 2022-10-04 MED ORDER — HYDRALAZINE HCL 50 MG PO TABS: 50.0000 mg | ORAL_TABLET | Freq: Three times a day (TID) | ORAL | 4 refills | Status: DC

## 2022-10-04 NOTE — Patient Instructions (Signed)
INCREASE Hydralazine to 50 mg Three times a day  Labs done today, your results will be available in MyChart, we will contact you for abnormal readings.  Your physician recommends that you schedule a follow-up appointment in: 4 months  If you have any questions or concerns before your next appointment please send [place] a message through [place] or call our office at [phone].    TO LEAVE A MESSAGE FOR THE NURSE SELECT OPTION 2, PLEASE LEAVE A MESSAGE INCLUDING: YOUR NAME DATE OF BIRTH CALL BACK NUMBER REASON FOR CALL**this is important as we prioritize the call backs  YOU WILL RECEIVE A CALL BACK THE SAME DAY AS LONG AS YOU CALL BEFORE 4:00 PM  At the [facility], you and your health needs are our priority. As part of our continuing mission to provide you with exceptional heart care, we have created designated Provider Care Teams. These Care Teams include your primary Cardiologist (physician) and Advanced Practice Providers (APPs- Physician Assistants and Nurse Practitioners) who all work together to provide you with the care you need, when you need it.   You may see any of the following providers on your designated Care Team at your next follow up: Dr Glori Bickers Dr Loralie Champagne Dr. Roxana Hires, NP Lyda Jester, [place] [facility] [facility], [place] Forestine Na, NP Audry Riles, PharmD   Please be sure to bring in all your medications bottles to every appointment.

## 2022-10-05 NOTE — Progress Notes (Signed)
PCP: Larey Dresser, MD HF Cardiology: Dr. Aundra Dubin  52 y.o. with history of HTN, DM, OSA on CPAP, and nonischemic cardiomyopathy was referred by Richardson Dopp for evaluation of CHF.  Patient was admitted in 11/20 with dyspnea and leg swelling. He was found to have CHF.  Echo in 11/20 showed EF 20-25%.  LHC/RHC showed elevated filling pressures, CI 2.0, and mild nonobstructive CAD. He was started on a regimen of cardiac meds.  Cardiac MRI in 1/21 showed EF still low at 21% with severely dilated LV.  There was no late gadolinium enhancement.    Echo in 7/21 showed EF 30-35%, normal RV. Echo in 2/22 showed EF up to 40-45%.    Echo in 6/23 showed EF 45%, moderate LVH, normal RV.   Patient returns for followup of CHF.  He has a new job working for [employer].  Using CPAP nightly. No dyspnea walking on flat ground or up a flight of stairs.  No chest pain.  No lightheadedness.  Weight up 7 lbs but wearing heavy clothes today.   Labs (11/20): K 5.1, creatinine 1.56 Labs (1/21): creatinine 1.38 Labs (3/21): K 4.6, creatinine 1.22 Labs (4/21): K 4.7, creatinine 1.42 Labs (7/21): K 4.2, creatinine 1.39 Labs (12/21): K 4.3, creatinine 1.31, LDL 34 Labs (10/22): BNP 12.7, LDL 53, K 4.2, creatinine 1.28 Labs (2/23): K 3.6, creatinine 1.18 Labs (6/23): BNP 11.5, creatinine 1.17, K 4.2  PMH: 1. Type 2 diabetes 2. HTN 3. Hyperlipidemia 4. OSA: Uses CPAP.  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  6. CKD stage 3 7. Chronic systolic CHF: Nonischemic cardiomyopathy.  - Echo (11/20): EF 20-25%, mild-moderate MR.  - LHC (11/20): Nonobstructive CAD; mean RA 10, PA 60/30 mean 40, mean PCWP 27, CI 2.0.  - Cardiac MRI (1/21): Severe LV enlargement with EF 21%, global hypokinesis, RV EF 33%, no LGE.  - Echo (7/21): EF 30-35%, normal RV.  - Echo (2/22): EF 40-45%, normal RV.  - Echo (6/23): EF 45%, moderate LVH, normal RV  SH: Married, works as Administrator.  Used to play arena  football.  No ETOH, no smoking.   FH: Mother with "heart issues," died when he was born.  No other known heart disease.   ROS: All systems reviewed and negative except as per HPI.   Current Outpatient Medications  Medication Sig Dispense Refill   aspirin 81 MG chewable tablet Chew 81 mg by mouth daily.     atorvastatin (LIPITOR) 40 MG tablet Take 1 tablet (40 mg total) by mouth at bedtime. 30 tablet 11   carvedilol (COREG) 25 MG tablet Take 1 tablet (25 mg total) by mouth 2 (two) times daily. 60 tablet 11   Dulaglutide (TRULICITY) 1.5 DJ/5.7SV SOPN Inject 1.5 mg into the skin once a week.     ENTRESTO 97-103 MG TAKE ONE TABLET BY MOUTH TWICE A DAY 60 tablet 6   furosemide (LASIX) 40 MG tablet TAKE 1 TABLET BY MOUTH DAILY 90 tablet 3   isosorbide mononitrate (IMDUR) 30 MG 24 hr tablet TAKE TWO TABLETS BY MOUTH DAILY 90 tablet 3   metFORMIN (GLUCOPHAGE) 1000 MG tablet Take 1,000 mg by mouth 2 (two) times daily with a meal.      methocarbamol (ROBAXIN) 500 MG tablet Take 1 tablet (500 mg total) by mouth 2 (two) times daily. 20 tablet 0   Multiple Vitamin (MULTIVITAMIN WITH MINERALS) TABS tablet Take 1 tablet by mouth daily.  spironolactone (ALDACTONE) 25 MG tablet Take 1 tablet (25 mg total) by mouth daily. 30 tablet 8   hydrALAZINE (APRESOLINE) 50 MG tablet Take 1 tablet (50 mg total) by mouth 3 (three) times daily. 220 tablet 4   No current facility-administered medications for this encounter.   Wt Readings from Last 3 Encounters:  10/04/22 (!) 159.8 kg (352 lb 6.4 oz)  03/15/22 (!) 156.5 kg (345 lb)  01/27/22 (!) 147.9 kg (326 lb)   BP 120/70   Pulse 100   Wt (!) 159.8 kg (352 lb 6.4 oz)   SpO2 98%   BMI 47.79 kg/m  General: NAD, obese.  Neck: No JVD, no thyromegaly or thyroid nodule.  Lungs: Clear to auscultation bilaterally with normal respiratory effort. CV: Nondisplaced PMI.  Heart regular S1/S2, no S3/S4, no murmur.  No peripheral edema.  No carotid bruit.  Normal pedal  pulses.  Abdomen: Soft, nontender, no hepatosplenomegaly, no distention.  Skin: Intact without lesions or rashes.  Neurologic: Alert and oriented x 3.  Psych: Normal affect. Extremities: No clubbing or cyanosis.  HEENT: Normal.   Assessment/Plan: 1. Chronic systolic CHF:  Nonischemic cardiomyopathy. No definite family history of cardiomyopathy.  No ETOH or drugs. Cath with nonobstructive disease in 11/20.  Cardiac MRI in 1/21 showed EF 21% and severe LV dilation, no LGE. Possible viral myocarditis versus diabetic cardiomyopathy.  BP does not seem to have been markedly high in the past so doubt that HTN explains CMP.  Echo in 2/22 showed EF up to 40-45%.  Echo in 6/23 with EF up to 45%, moderate LVH, normal RV. He does not look volume overloaded on exam, NYHA class I by report.  - Continue Entresto 97/103 bid.  - Continue Lasix 40 mg daily.  BMET/BNP today.  - Continue Coreg 25 mg bid.  - Continue spironolactone 25 mg daily.  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  - Increase hydralazine to 50 mg tid and continue Imdur 60 mg daily.  - EF is out of ICD range.   2. CKD: Stage 3, likely due to DM and HTN.   - BMET today.  3. HTN: BP controlled.  4. OSA: Continue to use CPAP.  5. Type 2 diabetes: Off Jardiance due to GU infections.   6. Hyperlipidemia: On atorvastatin.  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   Follow up in 4 months with APP.   Gary Jacobs  10/05/2022

## 2022-10-10 ENCOUNTER — Other Ambulatory Visit (HOSPITAL_COMMUNITY): Payer: Self-pay | Admitting: Cardiology

## 2022-10-20 ENCOUNTER — Other Ambulatory Visit (HOSPITAL_COMMUNITY): Payer: Self-pay

## 2022-11-11 ENCOUNTER — Other Ambulatory Visit (HOSPITAL_COMMUNITY): Payer: Self-pay | Admitting: Cardiology

## 2022-12-04 ENCOUNTER — Telehealth (HOSPITAL_COMMUNITY): Payer: Self-pay | Admitting: Cardiology

## 2022-12-04 NOTE — Telephone Encounter (Addendum)
Pt called to request letter for bring with him to [employer] physical Letter written describing condition/ HF status    -will ok with provider

## 2022-12-11 ENCOUNTER — Other Ambulatory Visit (HOSPITAL_COMMUNITY): Payer: Self-pay | Admitting: Cardiology

## 2022-12-14 NOTE — Telephone Encounter (Signed)
Letter completed and left in the front office

## 2022-12-20 ENCOUNTER — Other Ambulatory Visit (HOSPITAL_COMMUNITY): Payer: Self-pay

## 2023-01-06 ENCOUNTER — Other Ambulatory Visit (HOSPITAL_COMMUNITY): Payer: Self-pay | Admitting: Cardiology

## 2023-01-25 ENCOUNTER — Encounter (HOSPITAL_COMMUNITY): Payer: BC Managed Care – PPO

## 2023-01-26 ENCOUNTER — Other Ambulatory Visit (HOSPITAL_COMMUNITY): Payer: Self-pay

## 2023-03-01 ENCOUNTER — Other Ambulatory Visit (HOSPITAL_COMMUNITY): Payer: Self-pay

## 2023-04-16 ENCOUNTER — Other Ambulatory Visit (HOSPITAL_COMMUNITY): Payer: Self-pay | Admitting: Cardiology

## 2023-04-16 ENCOUNTER — Other Ambulatory Visit (HOSPITAL_COMMUNITY): Payer: Self-pay

## 2023-04-16 DIAGNOSIS — I5022 Chronic systolic (congestive) heart failure: Secondary | ICD-10-CM

## 2023-04-16 MED ORDER — CARVEDILOL 25 MG PO TABS
25.0000 mg | ORAL_TABLET | Freq: Two times a day (BID) | ORAL | 11 refills | Status: DC
  Filled 2023-04-16: qty 60, 30d supply, fill #0

## 2023-04-30 ENCOUNTER — Other Ambulatory Visit (HOSPITAL_COMMUNITY): Payer: Self-pay

## 2023-05-04 ENCOUNTER — Other Ambulatory Visit (HOSPITAL_COMMUNITY): Payer: Self-pay

## 2023-05-04 ENCOUNTER — Ambulatory Visit (HOSPITAL_COMMUNITY)
Admission: RE | Admit: 2023-05-04 | Discharge: 2023-05-04 | Disposition: A | Discharge status: Home / Self Care | Payer: Self-pay | Source: Ambulatory Visit | Attending: Cardiology | Admitting: Cardiology

## 2023-05-04 ENCOUNTER — Other Ambulatory Visit: Payer: Self-pay

## 2023-05-04 ENCOUNTER — Encounter (HOSPITAL_COMMUNITY): Payer: Self-pay | Admitting: Cardiology

## 2023-05-04 VITALS — BP 124/80 | HR 87 | Wt 324.8 lb

## 2023-05-04 DIAGNOSIS — Z7984 Long term (current) use of oral hypoglycemic drugs: Secondary | ICD-10-CM | POA: Insufficient documentation

## 2023-05-04 DIAGNOSIS — G4733 Obstructive sleep apnea (adult) (pediatric): Secondary | ICD-10-CM | POA: Insufficient documentation

## 2023-05-04 DIAGNOSIS — E1122 Type 2 diabetes mellitus with diabetic chronic kidney disease: Secondary | ICD-10-CM | POA: Diagnosis not present

## 2023-05-04 DIAGNOSIS — I2510 Atherosclerotic heart disease of native coronary artery without angina pectoris: Secondary | ICD-10-CM | POA: Diagnosis not present

## 2023-05-04 DIAGNOSIS — I83891 Varicose veins of right lower extremities with other complications: Secondary | ICD-10-CM | POA: Insufficient documentation

## 2023-05-04 DIAGNOSIS — Z79899 Other long term (current) drug therapy: Secondary | ICD-10-CM | POA: Insufficient documentation

## 2023-05-04 DIAGNOSIS — I428 Other cardiomyopathies: Secondary | ICD-10-CM | POA: Diagnosis not present

## 2023-05-04 DIAGNOSIS — R9431 Abnormal electrocardiogram [ECG] [EKG]: Secondary | ICD-10-CM | POA: Diagnosis not present

## 2023-05-04 DIAGNOSIS — I5022 Chronic systolic (congestive) heart failure: Secondary | ICD-10-CM | POA: Diagnosis not present

## 2023-05-04 DIAGNOSIS — E785 Hyperlipidemia, unspecified: Secondary | ICD-10-CM | POA: Insufficient documentation

## 2023-05-04 DIAGNOSIS — I130 Hypertensive heart and chronic kidney disease with heart failure and stage 1 through stage 4 chronic kidney disease, or unspecified chronic kidney disease: Secondary | ICD-10-CM | POA: Diagnosis not present

## 2023-05-04 DIAGNOSIS — N183 Chronic kidney disease, stage 3 unspecified: Secondary | ICD-10-CM | POA: Insufficient documentation

## 2023-05-04 LAB — BASIC METABOLIC PANEL
Anion gap: 10 (ref 5–15)
BUN: 12 mg/dL (ref 6–20)
CO2: 26 mmol/L (ref 22–32)
Calcium: 9.2 mg/dL (ref 8.9–10.3)
Chloride: 100 mmol/L (ref 98–111)
Creatinine, Ser: 1.21 mg/dL (ref 0.61–1.24)
GFR, Estimated: >60 mL/min (ref >60)
Glucose, Bld: 253 mg/dL — ABNORMAL HIGH (ref 70–99)
Potassium: 4.7 mmol/L (ref 3.5–5.1)
Sodium: 136 mmol/L (ref 135–145)

## 2023-05-04 LAB — LIPID PANEL
Cholesterol: 107 mg/dL (ref 0–200)
HDL: 42 mg/dL (ref >40)
LDL Cholesterol: 48 mg/dL (ref 0–99)
Total CHOL/HDL Ratio: 2.5 RATIO
Triglycerides: 86 mg/dL (ref <150)
VLDL: 17 mg/dL (ref 0–40)

## 2023-05-04 LAB — BRAIN NATRIURETIC PEPTIDE: B Natriuretic Peptide: 30.7 pg/mL (ref 0.0–100.0)

## 2023-05-04 MED ORDER — ISOSORBIDE MONONITRATE ER 30 MG PO TB24
60.0000 mg | ORAL_TABLET | Freq: Every day | ORAL | 3 refills | Status: DC
  Filled 2023-05-04: qty 90, 45d supply, fill #0
  Filled 2023-06-28: qty 90, 45d supply, fill #1
  Filled 2023-08-15: qty 90, 45d supply, fill #2
  Filled 2023-11-23: qty 90, 45d supply, fill #3

## 2023-05-04 MED ORDER — HYDRALAZINE HCL 50 MG PO TABS
50.0000 mg | ORAL_TABLET | Freq: Three times a day (TID) | ORAL | 4 refills | Status: DC
  Filled 2023-05-04: qty 220, 74d supply, fill #0
  Filled 2023-06-29: qty 220, 74d supply, fill #1
  Filled 2023-11-23: qty 220, 74d supply, fill #2
  Filled 2024-02-25: qty 220, 74d supply, fill #3

## 2023-05-04 MED ORDER — ATORVASTATIN CALCIUM 40 MG PO TABS
40.0000 mg | ORAL_TABLET | Freq: Every day | ORAL | 3 refills | Status: AC
  Filled 2023-05-04 (×2): qty 90, 90d supply, fill #0
  Filled 2023-08-07 – 2023-11-23 (×3): qty 90, 90d supply, fill #1

## 2023-05-04 MED ORDER — FUROSEMIDE 40 MG PO TABS
40.0000 mg | ORAL_TABLET | Freq: Every day | ORAL | 3 refills | Status: DC
  Filled 2023-05-04 (×2): qty 90, 90d supply, fill #0
  Filled 2023-08-07: qty 90, 90d supply, fill #1
  Filled 2023-11-23: qty 90, 90d supply, fill #2
  Filled 2024-02-25: qty 90, 90d supply, fill #3

## 2023-05-04 MED ORDER — CARVEDILOL 25 MG PO TABS
25.0000 mg | ORAL_TABLET | Freq: Two times a day (BID) | ORAL | 3 refills | Status: AC
  Filled 2023-05-04: qty 180, 90d supply, fill #0
  Filled 2023-05-04: qty 60, 30d supply, fill #0
  Filled 2023-05-04 – 2023-05-09 (×2): qty 180, 90d supply, fill #0
  Filled 2023-08-15: qty 180, 90d supply, fill #1
  Filled 2023-11-23: qty 180, 90d supply, fill #2
  Filled 2024-02-25: qty 180, 90d supply, fill #3

## 2023-05-04 MED ORDER — SPIRONOLACTONE 25 MG PO TABS
25.0000 mg | ORAL_TABLET | Freq: Every day | ORAL | 3 refills | Status: DC
  Filled 2023-05-17: qty 90, 90d supply, fill #0
  Filled 2023-08-15: qty 90, 90d supply, fill #1
  Filled 2023-11-23: qty 90, 90d supply, fill #2
  Filled 2024-02-25: qty 90, 90d supply, fill #3

## 2023-05-04 NOTE — Progress Notes (Signed)
H&V Care Navigation CSW Progress Note  Clinical Social Worker consulted to speak with pt regarding imminent loss of insurance.  Pt reports that he lost his job at the beginning of this month and that he has lost benefits despite still having money taken out of his check for it.  Clinic staff helped to check and insurance is still active but sounds like will terminate soon- pt to call insurance to get more exact dates.  CSW discussed plan for when pt ends.  He reports he applied for Medicaid beginning of this week.  Pt wife is only source of income with about $1,700/month so would likely qualify.  Discussed [facility] Assistance if he accrues any medical bills before then.    Informed pt of heart failure fund to assist with getting medications and initiated application for entresto assistance- will turn in once proof of income received.   SDOH Screenings   Food Insecurity: Food Insecurity Present (11/28/2019)  Housing: Low Risk  (11/28/2019)  Transportation Needs: No Transportation Needs (11/28/2019)  Financial Resource Strain: High Risk (11/28/2019)  Social Connections: Unknown (01/29/2022)   Received from [facility], [facility]  Tobacco Use: Low Risk  (05/04/2023)    Burna Sis, LCSW Clinical Social Worker [facility] Desk#: [phone] Cell#: [phone]

## 2023-05-04 NOTE — Progress Notes (Signed)
Medication Samples have been provided to the patient.  Drug name: Sherryll Burger       Strength: 49/51 mg        Qty: 4  LOT: [id number]  Exp.Date: 02/2024  Dosing instructions: Take 2 tablets Twice daily   The patient has been instructed regarding the correct time, dose, and frequency of taking this medication, including desired effects and most common side effects.   Gary Jacobs 1:28 PM 05/04/2023

## 2023-05-04 NOTE — Patient Instructions (Signed)
There has been no changes to your medications   Labs done today, your results will be available in MyChart, we will contact you for abnormal readings.  Your physician has requested that you have an echocardiogram. Echocardiography is a painless test that uses sound waves to create images of your heart. It provides your doctor with information about the size and shape of your heart and how well your heart's chambers and valves are working. This procedure takes approximately one hour. There are no restrictions for this procedure. Please do NOT wear cologne, perfume, aftershave, or lotions (deodorant is allowed). Please arrive 15 minutes prior to your appointment time.  Your physician recommends that you schedule a follow-up appointment in: 4 months with an echocardiogram   If you have any questions or concerns before your next appointment please send us a message through mychart or call our office at [phone].    TO LEAVE A MESSAGE FOR THE NURSE SELECT OPTION 2, PLEASE LEAVE A MESSAGE INCLUDING: YOUR NAME DATE OF BIRTH CALL BACK NUMBER REASON FOR CALL**this is important as we prioritize the call backs  YOU WILL RECEIVE A CALL BACK THE SAME DAY AS LONG AS YOU CALL BEFORE 4:00 PM  At the [facility], you and your health needs are our priority. As part of our continuing mission to provide you with exceptional heart care, we have created designated Provider Care Teams. These Care Teams include your primary Cardiologist (physician) and Advanced Practice Providers (APPs- Physician Assistants and Nurse Practitioners) who all work together to provide you with the care you need, when you need it.   You may see any of the following providers on your designated Care Team at your next follow up: Dr Daniel Bensimhon Dr Dalton McLean Dr. Aditya Sabharwal Amy Clegg, NP Brittainy Simmons, PA Jessica Milford,NP Lindsay Finch, PA Alma Diaz, NP Lauren Kemp, PharmD   Please be sure  to bring in all your medications bottles to every appointment.    Thank you for choosing [facility]    

## 2023-05-06 NOTE — Progress Notes (Signed)
PCP: Laurey Morale, MD HF Cardiology: Dr. Shirlee Latch  52 y.o. with history of HTN, DM, OSA on CPAP, and nonischemic cardiomyopathy was referred by Tereso Newcomer for evaluation of CHF.  Patient was admitted in 11/20 with dyspnea and leg swelling. He was found to have CHF.  Echo in 11/20 showed EF 20-25%.  LHC/RHC showed elevated filling pressures, CI 2.0, and mild nonobstructive CAD. He was started on a regimen of cardiac meds.  Cardiac MRI in 1/21 showed EF still low at 21% with severely dilated LV.  There was no late gadolinium enhancement.    Echo in 7/21 showed EF 30-35%, normal RV. Echo in 2/22 showed EF up to 40-45%.    Echo in 6/23 showed EF 45%, moderate LVH, normal RV.   Patient returns for followup of CHF.  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  His wife got a kidney transplant.  He is doing well symptomatically, no dyspnea walking on flat ground or up stairs. No orthopnea/PND.  Able to lift and carry boxes, etc, with no problems.  No lightheadedness or palpitations.  No chest pain.  Weight down 28 lbs since last appointment, now on tirzepatide.   ECG (personally reviewed): NSR, LAFB  Labs (11/20): K 5.1, creatinine 1.56 Labs (1/21): creatinine 1.38 Labs (3/21): K 4.6, creatinine 1.22 Labs (4/21): K 4.7, creatinine 1.42 Labs (7/21): K 4.2, creatinine 1.39 Labs (12/21): K 4.3, creatinine 1.31, LDL 34 Labs (10/22): BNP 12.7, LDL 53, K 4.2, creatinine 5.36 Labs (2/23): K 3.6, creatinine 1.18 Labs (6/23): BNP 11.5, creatinine 1.17, K 4.2 Labs (1/24): BNP 10, K 4.2, creatinine 1.19  PMH: 1. Type 2 diabetes 2. HTN 3. Hyperlipidemia 4. OSA: Uses CPAP.  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  6. CKD stage 3 7. Chronic systolic CHF: Nonischemic cardiomyopathy.  - Echo (11/20): EF 20-25%, mild-moderate MR.  - LHC (11/20): Nonobstructive CAD; mean RA 10, PA 60/30 mean 40, mean PCWP 27, CI 2.0.  - Cardiac MRI (1/21):  Severe LV enlargement with EF 21%, global hypokinesis, RV EF 33%, no LGE.  - Echo (7/21): EF 30-35%, normal RV.  - Echo (2/22): EF 40-45%, normal RV.  - Echo (6/23): EF 45%, moderate LVH, normal RV  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  No ETOH, no smoking.   FH: Mother with "heart issues," died when he was born.  No other known heart disease.   ROS: All systems reviewed and negative except as per HPI.   Current Outpatient Medications  Medication Sig Dispense Refill   aspirin 81 MG chewable tablet Chew 81 mg by mouth daily.     ENTRESTO 97-103 MG TAKE ONE TABLET BY MOUTH TWICE A DAY 60 tablet 6   metFORMIN (GLUCOPHAGE) 1000 MG tablet Take 1,000 mg by mouth 2 (two) times daily with a meal.      Multiple Vitamin (MULTIVITAMIN WITH MINERALS) TABS tablet Take 1 tablet by mouth daily.     Tirzepatide (MOUNJARO [place]) Inject 5 mg into the skin once a week.     atorvastatin (LIPITOR) 40 MG tablet Take 1 tablet (40 mg total) by mouth at bedtime. 90 tablet 3   carvedilol (COREG) 25 MG tablet Take 1 tablet (25 mg total) by mouth 2 (two) times daily. 180 tablet 3   furosemide (LASIX) 40 MG tablet Take 1 tablet (40 mg total) by mouth daily.  90 tablet 3   hydrALAZINE (APRESOLINE) 50 MG tablet Take 1 tablet (50 mg total) by mouth 3 (three) times daily. 220 tablet 4   isosorbide mononitrate (IMDUR) 30 MG 24 hr tablet Take 2 tablets (60 mg total) by mouth daily. 90 tablet 3   spironolactone (ALDACTONE) 25 MG tablet Take 1 tablet (25 mg total) by mouth daily. 90 tablet 3   No current facility-administered medications for this encounter.   Wt Readings from Last 3 Encounters:  05/04/23 (!) 147.3 kg (324 lb 12.8 oz)  10/04/22 (!) 159.8 kg (352 lb 6.4 oz)  03/15/22 (!) 156.5 kg (345 lb)   BP 124/80   Pulse 87   Wt (!) 147.3 kg (324 lb 12.8 oz)   SpO2 98%   BMI 44.05 kg/m  General: NAD Neck: No JVD, no thyromegaly or thyroid nodule.  Lungs: Clear to auscultation  bilaterally with normal respiratory effort. CV: Nondisplaced PMI.  Heart regular S1/S2, no S3/S4, no murmur.  No peripheral edema.  No carotid bruit.  Normal pedal pulses.  Abdomen: Soft, nontender, no hepatosplenomegaly, no distention.  Skin: Intact without lesions or rashes.  Neurologic: Alert and oriented x 3.  Psych: Normal affect. Extremities: No clubbing or cyanosis. Right lower leg varicosities.  HEENT: Normal.   Assessment/Plan: 1. Chronic systolic CHF:  Nonischemic cardiomyopathy. No definite family history of cardiomyopathy.  No ETOH or drugs. Cath with nonobstructive disease in 11/20.  Cardiac MRI in 1/21 showed EF 21% and severe LV dilation, no LGE. Possible viral myocarditis versus diabetic cardiomyopathy.  BP does not seem to have been markedly high in the past so doubt that HTN explains CMP.  Echo in 2/22 showed EF up to 40-45%.  Echo in 6/23 with EF up to 45%, moderate LVH, normal RV. He does not look volume overloaded on exam, NYHA class I symptoms.  - Continue Entresto 97/103 bid.  - Continue Lasix 40 mg daily.  BMET/BNP today.  - Continue Coreg 25 mg bid.  - Continue spironolactone 25 mg daily.  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  - Continue hydralazine 50 mg tid and Imdur 60 mg daily.  - EF was out of ICD range on last echo, due for repeat echo (will arrange).    2. CKD: Stage 3, likely due to DM and HTN.   - BMET today.  3. HTN: BP controlled.  4. OSA: Continue to use CPAP.  5. Type 2 diabetes: Off Jardiance due to GU infections.   6. Hyperlipidemia: On atorvastatin, check lipids.  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   Follow up in 4 months  Marca Ancona  05/06/2023

## 2023-05-08 ENCOUNTER — Encounter (HOSPITAL_COMMUNITY): Payer: 59 | Admitting: Cardiology

## 2023-05-08 ENCOUNTER — Other Ambulatory Visit (HOSPITAL_COMMUNITY): Payer: Self-pay

## 2023-05-09 ENCOUNTER — Other Ambulatory Visit (HOSPITAL_BASED_OUTPATIENT_CLINIC_OR_DEPARTMENT_OTHER): Payer: Self-pay

## 2023-05-09 ENCOUNTER — Other Ambulatory Visit (HOSPITAL_COMMUNITY): Payer: Self-pay

## 2023-05-14 ENCOUNTER — Telehealth (HOSPITAL_COMMUNITY): Payer: Self-pay | Admitting: Licensed Clinical Social Worker

## 2023-05-14 NOTE — Telephone Encounter (Signed)
H&V Care Navigation CSW Progress Note  Clinical Social Worker called pt to remind of need for proof of income to [employer] app for [employer] assistance- pt will plan to email me tax return.   SDOH Screenings   Food Insecurity: Food Insecurity Present (11/28/2019)  Housing: Low Risk  (11/28/2019)  Transportation Needs: No Transportation Needs (11/28/2019)  Financial Resource Strain: High Risk (11/28/2019)  Social Connections: Unknown (01/29/2022)   Received from [facility], [facility]  Tobacco Use: Low Risk  (05/04/2023)    Burna Sis, LCSW Clinical Social Worker [facility] Desk#: [phone] Cell#: [phone]

## 2023-05-17 ENCOUNTER — Other Ambulatory Visit (HOSPITAL_COMMUNITY): Payer: Self-pay

## 2023-05-30 ENCOUNTER — Other Ambulatory Visit (HOSPITAL_COMMUNITY): Payer: Self-pay

## 2023-06-01 ENCOUNTER — Other Ambulatory Visit: Payer: Self-pay

## 2023-06-01 ENCOUNTER — Emergency Department (HOSPITAL_COMMUNITY): Payer: Medicaid Other

## 2023-06-01 ENCOUNTER — Encounter (HOSPITAL_COMMUNITY): Payer: Self-pay | Admitting: *Deleted

## 2023-06-01 ENCOUNTER — Emergency Department (HOSPITAL_COMMUNITY)
Admission: EM | Admit: 2023-06-01 | Discharge: 2023-06-01 | Disposition: A | Discharge status: Home / Self Care | Payer: Medicaid Other | Attending: Emergency Medicine | Admitting: Emergency Medicine

## 2023-06-01 DIAGNOSIS — Z7984 Long term (current) use of oral hypoglycemic drugs: Secondary | ICD-10-CM | POA: Insufficient documentation

## 2023-06-01 DIAGNOSIS — Z7982 Long term (current) use of aspirin: Secondary | ICD-10-CM | POA: Diagnosis not present

## 2023-06-01 DIAGNOSIS — Z79899 Other long term (current) drug therapy: Secondary | ICD-10-CM | POA: Insufficient documentation

## 2023-06-01 DIAGNOSIS — I10 Essential (primary) hypertension: Secondary | ICD-10-CM | POA: Insufficient documentation

## 2023-06-01 DIAGNOSIS — E119 Type 2 diabetes mellitus without complications: Secondary | ICD-10-CM | POA: Insufficient documentation

## 2023-06-01 DIAGNOSIS — R079 Chest pain, unspecified: Secondary | ICD-10-CM

## 2023-06-01 DIAGNOSIS — R0789 Other chest pain: Secondary | ICD-10-CM | POA: Insufficient documentation

## 2023-06-01 LAB — BASIC METABOLIC PANEL
Anion gap: 12 (ref 5–15)
BUN: 14 mg/dL (ref 6–20)
CO2: 22 mmol/L (ref 22–32)
Calcium: 9.4 mg/dL (ref 8.9–10.3)
Chloride: 99 mmol/L (ref 98–111)
Creatinine, Ser: 1.25 mg/dL — ABNORMAL HIGH (ref 0.61–1.24)
GFR, Estimated: >60 mL/min (ref >60)
Glucose, Bld: 262 mg/dL — ABNORMAL HIGH (ref 70–99)
Potassium: 3.7 mmol/L (ref 3.5–5.1)
Sodium: 133 mmol/L — ABNORMAL LOW (ref 135–145)

## 2023-06-01 LAB — BRAIN NATRIURETIC PEPTIDE: B Natriuretic Peptide: 8.5 pg/mL (ref 0.0–100.0)

## 2023-06-01 LAB — CBC
HCT: 40.9 % (ref 39.0–52.0)
Hemoglobin: 12.9 g/dL — ABNORMAL LOW (ref 13.0–17.0)
MCH: 26.8 pg (ref 26.0–34.0)
MCHC: 31.5 g/dL (ref 30.0–36.0)
MCV: 85 fL (ref 80.0–100.0)
Platelets: 231 10*3/uL (ref 150–400)
RBC: 4.81 MIL/uL (ref 4.22–5.81)
RDW: 14.2 % (ref 11.5–15.5)
WBC: 4.6 10*3/uL (ref 4.0–10.5)
nRBC: 0 % (ref 0.0–0.2)

## 2023-06-01 LAB — TROPONIN I (HIGH SENSITIVITY)
Troponin I (High Sensitivity): 22 ng/L — ABNORMAL HIGH (ref <18)
Troponin I (High Sensitivity): 22 ng/L — ABNORMAL HIGH (ref <18)

## 2023-06-01 NOTE — Discharge Instructions (Signed)
All your blood work was reassuring today.  Suspect the pain is related to the [facility].  When you see your doctor tomorrow you may want to discuss going back down to the 5 mg to see if the symptoms resolve.  If you continue to have chest pain even with that change follow-up with Dr. Shirlee Latch

## 2023-06-01 NOTE — ED Triage Notes (Signed)
The pt had some sob and chest pain 2 days ago that returned today   no pain at  present  hx chf

## 2023-06-01 NOTE — ED Provider Notes (Signed)
[place] EMERGENCY DEPARTMENT AT [facility] Provider Note   CSN: [id number] Arrival date & time: 06/01/23  1500     History  Chief Complaint  Patient presents with   Chest Pain    Gary Jacobs is a 52 y.o. male.  Pt is a 52y/o male with hx of HTN, DM, OSA on CPAP, and nonischemic cardiomyopathy last EF 45% who is presenting today due to complaints of intermittent chest tightness and shortness of breath.  He reports symptoms have been present for the last 2 weeks.  They have been coming and going.  They are not exertional.  He reports yesterday he rode the exercise bike and was lifting heavy things at work and never had any symptoms.  Patient reports sometimes he can just be sitting and the tightness will start.  He reports for the last 2 weeks just overall he is just not felt as well.  He has not noticed any new swelling.  He has lost approximately 70 pounds in the last year after starting Mounjaro and exercise and dietary changes.  He has not had new cough, congestion, fever, abdominal pain, nausea or vomiting.  He only has the shortness of breath when he is having the pain.  He is not having any shortness of breath at this time and denies any pain.  He does not think it is related to eating.  The only medication change he has had is the [facility] increased 2 weeks ago which is also coinciding with when he started having the symptoms.  However because this has been ongoing and his cardiac history he was concerned this might be his heart and he wanted to get checked out.  He denies any near-syncope.  1 day he did notice that his blood pressure was low when he got home but otherwise it have been okay.  He saw Dr. Sherlie Ban with cardiology approximately 3 weeks ago and reports everything was going well.  The history is provided by the patient.  Chest Pain      Home Medications Prior to Admission medications   Medication Sig Start Date End Date Taking? Authorizing Provider   aspirin 81 MG chewable tablet Chew 81 mg by mouth daily.    [provider]  atorvastatin (LIPITOR) 40 MG tablet Take 1 tablet (40 mg total) by mouth at bedtime. 05/04/23   Laurey Morale, MD  carvedilol (COREG) 25 MG tablet Take 1 tablet (25 mg total) by mouth 2 (two) times daily. 05/04/23   Laurey Morale, MD  ENTRESTO 97-103 MG TAKE ONE TABLET BY MOUTH TWICE A DAY 04/25/22   Laurey Morale, MD  furosemide (LASIX) 40 MG tablet Take 1 tablet (40 mg total) by mouth daily. 05/04/23   Laurey Morale, MD  hydrALAZINE (APRESOLINE) 50 MG tablet Take 1 tablet (50 mg total) by mouth 3 (three) times daily. 05/04/23   Laurey Morale, MD  isosorbide mononitrate (IMDUR) 30 MG 24 hr tablet Take 2 tablets (60 mg total) by mouth daily. 05/04/23   Laurey Morale, MD  metFORMIN (GLUCOPHAGE) 1000 MG tablet Take 1,000 mg by mouth 2 (two) times daily with a meal.     [provider]  Multiple Vitamin (MULTIVITAMIN WITH MINERALS) TABS tablet Take 1 tablet by mouth daily.    [provider]  spironolactone (ALDACTONE) 25 MG tablet Take 1 tablet (25 mg total) by mouth daily. 05/04/23   Laurey Morale, MD  Tirzepatide [facility]) Inject 5 mg  into the skin once a week.    [provider]      Allergies    Dapagliflozin and Jardiance [empagliflozin]    Review of Systems   Review of Systems  Cardiovascular:  Positive for chest pain.    Physical Exam Updated Vital Signs BP 110/66   Pulse 78   Temp 98.4 F (36.9 C)   Resp (!) 22   Ht 6' (1.829 m)   Wt (!) 147.3 kg   SpO2 99%   BMI 44.04 kg/m  Physical Exam Vitals and nursing note reviewed.  Constitutional:      General: He is not in acute distress.    Appearance: He is well-developed.  HENT:     Head: Normocephalic and atraumatic.  Eyes:     Conjunctiva/sclera: Conjunctivae normal.     Pupils: Pupils are equal, round, and reactive to light.  Cardiovascular:     Rate and Rhythm: Normal rate and regular  rhythm.     Pulses: Normal pulses.     Heart sounds: No murmur heard. Pulmonary:     Effort: Pulmonary effort is normal. No respiratory distress.     Breath sounds: Normal breath sounds. No wheezing or rales.  Abdominal:     General: There is no distension.     Palpations: Abdomen is soft.     Tenderness: There is no abdominal tenderness. There is no guarding or rebound.  Musculoskeletal:        General: No tenderness. Normal range of motion.     Cervical back: Normal range of motion and neck supple.     Right lower leg: No edema.     Left lower leg: No edema.  Skin:    General: Skin is warm and dry.     Findings: No erythema or rash.  Neurological:     Mental Status: He is alert and oriented to person, place, and time.  Psychiatric:        Behavior: Behavior normal.     ED Results / Procedures / Treatments   Labs (all labs ordered are listed, but only abnormal results are displayed) Labs Reviewed  BASIC METABOLIC PANEL - Abnormal; Notable for the following components:      Result Value   Sodium 133 (*)    Glucose, Bld 262 (*)    Creatinine, Ser 1.25 (*)    All other components within normal limits  CBC - Abnormal; Notable for the following components:   Hemoglobin 12.9 (*)    All other components within normal limits  TROPONIN I (HIGH SENSITIVITY) - Abnormal; Notable for the following components:   Troponin I (High Sensitivity) 22 (*)    All other components within normal limits  TROPONIN I (HIGH SENSITIVITY) - Abnormal; Notable for the following components:   Troponin I (High Sensitivity) 22 (*)    All other components within normal limits  BRAIN NATRIURETIC PEPTIDE    EKG EKG Interpretation Date/Time:  Friday June 01 2023 15:12:55 EDT Ventricular Rate:  96 PR Interval:  174 QRS Duration:  92 QT Interval:  350 QTC Calculation: 442 R Axis:   -40  Text Interpretation: Normal sinus rhythm Left axis deviation Minimal voltage criteria for LVH, may be normal  variant ( R in aVL ) Possible Anterolateral infarct , age undetermined No significant change since last tracing When compared with ECG of 04-May-2023 11:56, PREVIOUS ECG IS PRESENT Confirmed by Gwyneth Sprout ([id number]) on 06/01/2023 5:54:38 PM  Radiology DG Chest 2 View  Result Date:  06/01/2023 CLINICAL DATA:  Chest tightness for 2 days, short of breath EXAM: CHEST - 2 VIEW COMPARISON:  07/31/2019 FINDINGS: The heart size and mediastinal contours are within normal limits. Both lungs are clear. The visualized skeletal structures are unremarkable. IMPRESSION: No active cardiopulmonary disease. Electronically Signed   By: Sharlet Salina M.D.   On: 06/01/2023 16:41    Procedures Procedures    Medications Ordered in ED Medications - No data to display  ED Course/ Medical Decision Making/ A&P                                 Medical Decision Making Amount and/or Complexity of Data Reviewed External Data Reviewed: notes. Labs: ordered. Decision-making details documented in ED Course. Radiology: ordered and independent interpretation performed. Decision-making details documented in ED Course. ECG/medicine tests: ordered and independent interpretation performed. Decision-making details documented in ED Course.   Pt with multiple medical problems and comorbidities and presenting today with a complaint that caries a high risk for morbidity and mortality.  Here today with nonspecific chest pain which has been present off and on for the last 2 weeks.  It is not exertional in nature but also low suspicion for pneumonia, pneumothorax.  Will rule out ACS, CHF exacerbation however patient does not appear fluid overloaded today.  Could be related to increasing his Greggory Keen which also coincided with when the pain started 2 weeks ago.  I independently interpreted patient's labs and EKG.  Has no significant change in EKG when compared with priors, BMP today with normal potassium and creatinine 1.25 which is  unchanged from his most recent tests, CBC with normal white count and unchanged hemoglobin, initial troponin is 22 without recent to compare but last ones that were checked were in the 100s when he had an EF of 20% with exacerbated heart failure.  Delta troponin, BNP are pending. I have independently visualized and interpreted pt's images today.  Chest x-ray today without evidence of cardiomegaly or fluid.  Patient satting at 100% on room air with normal vital signs.  10:14 PM BNP today is normal at 8 and repeat troponin is flat at 22.  Findings discussed with the patient and his wife.  At this time no indication for further testing.  Feel that patient is stable for discharge home and does not require admission.  Also when talking with him he thinks the symptoms are the worst right after he gets the shot and kind of started to improve before he takes the neck shot.  Suspect the nonspecific chest pain is coming from the [facility].  He has an appointment with his doctor tomorrow and will discuss decreasing the dose however if he continues to have symptoms he will follow-up with his cardiologist.  He was given return precautions.  He and his wife are comfortable with this plan.          Final Clinical Impression(s) / ED Diagnoses Final diagnoses:  Nonspecific chest pain    Rx / DC Orders ED Discharge Orders     None         Gwyneth Sprout, MD 06/01/23 2214

## 2023-06-03 ENCOUNTER — Other Ambulatory Visit (HOSPITAL_COMMUNITY): Payer: Self-pay | Admitting: Cardiology

## 2023-06-15 ENCOUNTER — Other Ambulatory Visit (HOSPITAL_COMMUNITY): Payer: Self-pay

## 2023-06-28 ENCOUNTER — Other Ambulatory Visit (HOSPITAL_COMMUNITY): Payer: Self-pay

## 2023-07-02 ENCOUNTER — Other Ambulatory Visit (HOSPITAL_COMMUNITY): Payer: Self-pay

## 2023-07-03 ENCOUNTER — Other Ambulatory Visit (HOSPITAL_COMMUNITY): Payer: Self-pay

## 2023-07-03 ENCOUNTER — Other Ambulatory Visit (HOSPITAL_COMMUNITY): Payer: Self-pay | Admitting: Cardiology

## 2023-07-03 MED ORDER — ENTRESTO 97-103 MG PO TABS
1.0000 | ORAL_TABLET | Freq: Two times a day (BID) | ORAL | 6 refills | Status: DC
Start: 2023-07-03 — End: 2024-07-02
  Filled 2023-07-03: qty 60, 30d supply, fill #0
  Filled 2023-08-07: qty 60, 30d supply, fill #1
  Filled 2023-09-04 – 2023-11-23 (×2): qty 60, 30d supply, fill #2
  Filled 2024-01-28: qty 60, 30d supply, fill #3
  Filled 2024-03-10: qty 60, 30d supply, fill #4
  Filled 2024-04-14: qty 60, 30d supply, fill #5
  Filled 2024-05-27: qty 60, 30d supply, fill #6

## 2023-07-04 ENCOUNTER — Other Ambulatory Visit (HOSPITAL_COMMUNITY): Payer: Self-pay

## 2023-08-07 ENCOUNTER — Other Ambulatory Visit (HOSPITAL_COMMUNITY): Payer: Self-pay

## 2023-08-15 ENCOUNTER — Other Ambulatory Visit (HOSPITAL_COMMUNITY): Payer: Self-pay

## 2023-08-27 ENCOUNTER — Ambulatory Visit (HOSPITAL_BASED_OUTPATIENT_CLINIC_OR_DEPARTMENT_OTHER)
Admission: RE | Admit: 2023-08-27 | Discharge: 2023-08-27 | Disposition: A | Discharge status: Home / Self Care | Payer: Medicaid Other | Source: Ambulatory Visit | Attending: Cardiology | Admitting: Cardiology

## 2023-08-27 ENCOUNTER — Encounter (HOSPITAL_COMMUNITY): Payer: Self-pay | Admitting: Cardiology

## 2023-08-27 ENCOUNTER — Ambulatory Visit (HOSPITAL_COMMUNITY)
Admission: RE | Admit: 2023-08-27 | Discharge: 2023-08-27 | Disposition: A | Discharge status: Home / Self Care | Payer: Medicaid Other | Source: Ambulatory Visit | Attending: Cardiology | Admitting: Cardiology

## 2023-08-27 VITALS — BP 136/80 | HR 77 | Wt 324.6 lb

## 2023-08-27 DIAGNOSIS — I130 Hypertensive heart and chronic kidney disease with heart failure and stage 1 through stage 4 chronic kidney disease, or unspecified chronic kidney disease: Secondary | ICD-10-CM | POA: Diagnosis not present

## 2023-08-27 DIAGNOSIS — I5022 Chronic systolic (congestive) heart failure: Secondary | ICD-10-CM

## 2023-08-27 DIAGNOSIS — N183 Chronic kidney disease, stage 3 unspecified: Secondary | ICD-10-CM | POA: Insufficient documentation

## 2023-08-27 DIAGNOSIS — E785 Hyperlipidemia, unspecified: Secondary | ICD-10-CM | POA: Diagnosis not present

## 2023-08-27 DIAGNOSIS — Z7984 Long term (current) use of oral hypoglycemic drugs: Secondary | ICD-10-CM | POA: Insufficient documentation

## 2023-08-27 DIAGNOSIS — Z7985 Long-term (current) use of injectable non-insulin antidiabetic drugs: Secondary | ICD-10-CM | POA: Diagnosis not present

## 2023-08-27 DIAGNOSIS — I8391 Asymptomatic varicose veins of right lower extremity: Secondary | ICD-10-CM | POA: Insufficient documentation

## 2023-08-27 DIAGNOSIS — M7989 Other specified soft tissue disorders: Secondary | ICD-10-CM | POA: Diagnosis not present

## 2023-08-27 DIAGNOSIS — G4733 Obstructive sleep apnea (adult) (pediatric): Secondary | ICD-10-CM | POA: Insufficient documentation

## 2023-08-27 DIAGNOSIS — Z79899 Other long term (current) drug therapy: Secondary | ICD-10-CM | POA: Insufficient documentation

## 2023-08-27 DIAGNOSIS — I428 Other cardiomyopathies: Secondary | ICD-10-CM | POA: Diagnosis not present

## 2023-08-27 DIAGNOSIS — E1122 Type 2 diabetes mellitus with diabetic chronic kidney disease: Secondary | ICD-10-CM | POA: Insufficient documentation

## 2023-08-27 NOTE — Progress Notes (Signed)
  Echocardiogram 2D Echocardiogram has been performed.  Gary Jacobs 08/27/2023, 1:33 PM

## 2023-08-27 NOTE — Patient Instructions (Signed)
Medication Changes:  None, continue current medications  Lab Work:  Your physician recommends that you return for lab work in: 3 months  Special Instructions // Education:  Do the following things EVERYDAY: Weigh yourself in the morning before breakfast. Write it down and keep it in a log. Take your medicines as prescribed Eat low salt foods--Limit salt (sodium) to 2000 mg per day.  Stay as active as you can everyday Limit all fluids for the day to less than 2 liters   Follow-Up in: 6 months   At the [facility], you and your health needs are our priority. We have a designated team specialized in the treatment of Heart Failure. This Care Team includes your primary Heart Failure Specialized Cardiologist (physician), Advanced Practice Providers (APPs- Physician Assistants and Nurse Practitioners), and Pharmacist who all work together to provide you with the care you need, when you need it.   You may see any of the following providers on your designated Care Team at your next follow up:  Dr. Arvilla Meres Dr. Marca Ancona Dr. Dorthula Nettles Dr. Theresia Bough Tonye Becket, NP Robbie Lis, [place] [facility] [place], [place] Brynda Peon, NP [place] Lee, NP Karle Plumber, PharmD   Please be sure to bring in all your medications bottles to every appointment.   Need to Contact us:  If you have any questions or concerns before your next appointment please send [place] a message through [place] or call our office at [phone].    TO LEAVE A MESSAGE FOR THE NURSE SELECT OPTION 2, PLEASE LEAVE A MESSAGE INCLUDING: YOUR NAME DATE OF BIRTH CALL BACK NUMBER REASON FOR CALL**this is important as we prioritize the call backs  YOU WILL RECEIVE A CALL BACK THE SAME DAY AS LONG AS YOU CALL BEFORE 4:00 PM

## 2023-08-28 LAB — ECHOCARDIOGRAM COMPLETE
Area-P 1/2: 4.49 cm2
S' Lateral: 3.8 cm

## 2023-08-28 NOTE — Progress Notes (Signed)
PCP: Laurey Morale, MD HF Cardiology: Dr. Shirlee Latch  52 y.o. with history of HTN, DM, OSA on CPAP, and nonischemic cardiomyopathy was referred by Tereso Newcomer for evaluation of CHF.  Patient was admitted in 11/20 with dyspnea and leg swelling. He was found to have CHF.  Echo in 11/20 showed EF 20-25%.  LHC/RHC showed elevated filling pressures, CI 2.0, and mild nonobstructive CAD. He was started on a regimen of cardiac meds.  Cardiac MRI in 1/21 showed EF still low at 21% with severely dilated LV.  There was no late gadolinium enhancement.    Echo in 7/21 showed EF 30-35%, normal RV. Echo in 2/22 showed EF up to 40-45%.    Echo in 6/23 showed EF 45%, moderate LVH, normal RV.   Echo today was reviewed and showed EF 50-55%, normal RV, normal IVC.   Patient returns for followup of CHF.  He is now working as a Merchandiser, retail at the [employer].  He works long hours.  No exertional dyspnea loading boxes on trucks, etc.  No chest pain.  No orthopnea/PND.  No lightheadedness. He is on tirzepatide but weight has remained stable.   ECG (personally reviewed): NSR, LAFB, poor RWP.   Labs (11/20): K 5.1, creatinine 1.56 Labs (1/21): creatinine 1.38 Labs (3/21): K 4.6, creatinine 1.22 Labs (4/21): K 4.7, creatinine 1.42 Labs (7/21): K 4.2, creatinine 1.39 Labs (12/21): K 4.3, creatinine 1.31, LDL 34 Labs (10/22): BNP 12.7, LDL 53, K 4.2, creatinine 1.61 Labs (2/23): K 3.6, creatinine 1.18 Labs (6/23): BNP 11.5, creatinine 1.17, K 4.2 Labs (1/24): BNP 10, K 4.2, creatinine 1.19 Labs (9/24): BNP 8.5, creatinine 1.25, K 3.7  PMH: 1. Type 2 diabetes 2. HTN 3. Hyperlipidemia 4. OSA: Uses CPAP.  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  6. CKD stage 3 7. Chronic systolic CHF: Nonischemic cardiomyopathy.  - Echo (11/20): EF 20-25%, mild-moderate MR.  - LHC (11/20): Nonobstructive CAD; mean RA 10, PA 60/30 mean 40, mean PCWP 27, CI 2.0.  - Cardiac MRI (1/21): Severe LV enlargement  with EF 21%, global hypokinesis, RV EF 33%, no LGE.  - Echo (7/21): EF 30-35%, normal RV.  - Echo (2/22): EF 40-45%, normal RV.  - Echo (6/23): EF 45%, moderate LVH, normal RV - Echo (12/24): EF 50-55%, normal RV, normal IVC.  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  No ETOH, no smoking.   FH: Mother with "heart issues," died when he was born.  No other known heart disease.   ROS: All systems reviewed and negative except as per HPI.   Current Outpatient Medications  Medication Sig Dispense Refill   aspirin 81 MG chewable tablet Chew 81 mg by mouth daily.     atorvastatin (LIPITOR) 40 MG tablet Take 1 tablet (40 mg total) by mouth at bedtime. 90 tablet 3   carvedilol (COREG) 25 MG tablet Take 1 tablet (25 mg total) by mouth 2 (two) times daily. 180 tablet 3   furosemide (LASIX) 40 MG tablet Take 1 tablet (40 mg total) by mouth daily. 90 tablet 3   hydrALAZINE (APRESOLINE) 50 MG tablet Take 1 tablet (50 mg total) by mouth 3 (three) times daily. 220 tablet 4   isosorbide mononitrate (IMDUR) 30 MG 24 hr tablet Take 2 tablets (60 mg total) by mouth daily. 90 tablet 3   metFORMIN (GLUCOPHAGE) 1000 MG tablet Take 1,000 mg by mouth 2 (two) times daily with a  meal.      Multiple Vitamin (MULTIVITAMIN WITH MINERALS) TABS tablet Take 1 tablet by mouth daily.     sacubitril-valsartan (ENTRESTO) 97-103 MG Take 1 tablet by mouth 2 (two) times daily. 60 tablet 6   spironolactone (ALDACTONE) 25 MG tablet Take 1 tablet (25 mg total) by mouth daily. 90 tablet 3   Tirzepatide (MOUNJARO [place]) Inject 5 mg into the skin once a week.     No current facility-administered medications for this encounter.   Wt Readings from Last 3 Encounters:  08/27/23 (!) 147.2 kg (324 lb 9.6 oz)  06/01/23 (!) 147.3 kg (324 lb 11.8 oz)  05/04/23 (!) 147.3 kg (324 lb 12.8 oz)   BP 136/80   Pulse 77   Wt (!) 147.2 kg (324 lb 9.6 oz)   SpO2 98%   BMI 44.02 kg/m  General: NAD, obese Neck: No JVD, no  thyromegaly or thyroid nodule.  Lungs: Clear to auscultation bilaterally with normal respiratory effort. CV: Nondisplaced PMI.  Heart regular S1/S2, no S3/S4, no murmur.  No peripheral edema.  No carotid bruit.  Normal pedal pulses.  Abdomen: Soft, nontender, no hepatosplenomegaly, no distention.  Skin: Intact without lesions or rashes.  Neurologic: Alert and oriented x 3.  Psych: Normal affect. Extremities: No clubbing or cyanosis.  HEENT: Normal.   Assessment/Plan: 1. Chronic systolic CHF:  Nonischemic cardiomyopathy. No definite family history of cardiomyopathy.  No ETOH or drugs. Cath with nonobstructive disease in 11/20.  Cardiac MRI in 1/21 showed EF 21% and severe LV dilation, no LGE. Possible viral myocarditis versus diabetic cardiomyopathy.  BP does not seem to have been markedly high in the past so doubt that HTN explains CMP.  Echo in 2/22 showed EF up to 40-45%.  Echo in 6/23 with EF up to 45%, moderate LVH, normal RV.  Echo today was reviewed and showed EF up again to 50-55% with normal RV. He does not look volume overloaded on exam, NYHA class I symptoms and working full time.   - Continue Entresto 97/103 bid.  - Continue Lasix 40 mg daily.  BMET/BNP today.   - Continue Coreg 25 mg bid.  - Continue spironolactone 25 mg daily.  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  - Continue hydralazine 50 mg tid and Imdur 60 mg daily.  2. CKD: Stage 3, likely due to DM and HTN.   - BMET today.  3. HTN: BP controlled.  4. OSA: Continue to use CPAP.  5. Type 2 diabetes: Off Jardiance due to GU infections.   6. Hyperlipidemia: On atorvastatin.  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   Follow up in 6 months with APP but needs BMET in 3 months.   Marca Ancona  08/28/2023

## 2023-09-04 ENCOUNTER — Other Ambulatory Visit (HOSPITAL_COMMUNITY): Payer: Self-pay

## 2023-11-23 ENCOUNTER — Other Ambulatory Visit: Payer: Self-pay

## 2023-11-26 ENCOUNTER — Telehealth (HOSPITAL_COMMUNITY): Payer: Self-pay | Admitting: Cardiology

## 2023-11-26 ENCOUNTER — Ambulatory Visit (HOSPITAL_COMMUNITY)
Admission: RE | Admit: 2023-11-26 | Discharge: 2023-11-26 | Disposition: A | Discharge status: Home / Self Care | Payer: Medicaid Other | Source: Ambulatory Visit | Attending: Cardiology | Admitting: Cardiology

## 2023-11-26 DIAGNOSIS — I5022 Chronic systolic (congestive) heart failure: Secondary | ICD-10-CM

## 2023-11-26 LAB — BASIC METABOLIC PANEL
Anion gap: 7 (ref 5–15)
BUN: 9 mg/dL (ref 6–20)
CO2: 25 mmol/L (ref 22–32)
Calcium: 9.2 mg/dL (ref 8.9–10.3)
Chloride: 106 mmol/L (ref 98–111)
Creatinine, Ser: 1.2 mg/dL (ref 0.61–1.24)
GFR, Estimated: >60 mL/min (ref >60)
Glucose, Bld: 266 mg/dL — ABNORMAL HIGH (ref 70–99)
Potassium: 4.6 mmol/L (ref 3.5–5.1)
Sodium: 138 mmol/L (ref 135–145)

## 2023-11-26 NOTE — Telephone Encounter (Signed)
 Triage walk in  Patient is in the lobby stating he needs a letter from doctor conforming his injection rate (percentage rate) for him to continue working. Patient said that's in the notes/chart. Please advise. Thank you!   LETTER WRITTEN

## 2024-01-28 ENCOUNTER — Other Ambulatory Visit (HOSPITAL_COMMUNITY): Payer: Self-pay

## 2024-01-28 ENCOUNTER — Other Ambulatory Visit (HOSPITAL_COMMUNITY): Payer: Self-pay | Admitting: Cardiology

## 2024-01-30 ENCOUNTER — Other Ambulatory Visit: Payer: Self-pay

## 2024-01-30 ENCOUNTER — Other Ambulatory Visit (HOSPITAL_COMMUNITY): Payer: Self-pay

## 2024-01-30 MED ORDER — ISOSORBIDE MONONITRATE ER 30 MG PO TB24
60.0000 mg | ORAL_TABLET | Freq: Every day | ORAL | 3 refills | Status: DC
  Filled 2024-01-30: qty 60, 30d supply, fill #0
  Filled 2024-03-10: qty 60, 30d supply, fill #1
  Filled 2024-04-14: qty 60, 30d supply, fill #2
  Filled 2024-05-27: qty 60, 30d supply, fill #3
  Filled 2024-07-02: qty 60, 30d supply, fill #4
  Filled 2024-08-05: qty 60, 30d supply, fill #5

## 2024-01-31 ENCOUNTER — Telehealth (HOSPITAL_COMMUNITY): Payer: Self-pay | Admitting: Pharmacy Technician

## 2024-01-31 NOTE — Telephone Encounter (Signed)
 Advanced Heart Failure Patient Advocate Encounter  Patient left vm requesting refill of Isosorbide  to [facility]. Sent 30 day RX request to Chantel (CMA) to send to [facility].   Correne Dillon, CPhT

## 2024-02-04 ENCOUNTER — Other Ambulatory Visit (HOSPITAL_COMMUNITY): Payer: Self-pay

## 2024-02-25 ENCOUNTER — Ambulatory Visit (HOSPITAL_COMMUNITY): Payer: Self-pay | Admitting: Family Medicine

## 2024-02-25 ENCOUNTER — Encounter (HOSPITAL_COMMUNITY): Payer: Self-pay

## 2024-02-25 ENCOUNTER — Ambulatory Visit (HOSPITAL_COMMUNITY)
Admission: RE | Admit: 2024-02-25 | Discharge: 2024-02-25 | Disposition: A | Discharge status: Home / Self Care | Payer: Medicaid Other | Source: Ambulatory Visit | Attending: Family Medicine

## 2024-02-25 ENCOUNTER — Other Ambulatory Visit (HOSPITAL_COMMUNITY): Payer: Self-pay

## 2024-02-25 VITALS — BP 110/78 | HR 88 | Ht 72.0 in | Wt 321.0 lb

## 2024-02-25 DIAGNOSIS — I5022 Chronic systolic (congestive) heart failure: Secondary | ICD-10-CM | POA: Diagnosis not present

## 2024-02-25 DIAGNOSIS — Z86718 Personal history of other venous thrombosis and embolism: Secondary | ICD-10-CM | POA: Diagnosis not present

## 2024-02-25 DIAGNOSIS — N183 Chronic kidney disease, stage 3 unspecified: Secondary | ICD-10-CM | POA: Diagnosis not present

## 2024-02-25 DIAGNOSIS — Z7985 Long-term (current) use of injectable non-insulin antidiabetic drugs: Secondary | ICD-10-CM | POA: Diagnosis not present

## 2024-02-25 DIAGNOSIS — I2510 Atherosclerotic heart disease of native coronary artery without angina pectoris: Secondary | ICD-10-CM | POA: Insufficient documentation

## 2024-02-25 DIAGNOSIS — I130 Hypertensive heart and chronic kidney disease with heart failure and stage 1 through stage 4 chronic kidney disease, or unspecified chronic kidney disease: Secondary | ICD-10-CM | POA: Insufficient documentation

## 2024-02-25 DIAGNOSIS — I428 Other cardiomyopathies: Secondary | ICD-10-CM | POA: Diagnosis not present

## 2024-02-25 DIAGNOSIS — Z79899 Other long term (current) drug therapy: Secondary | ICD-10-CM | POA: Insufficient documentation

## 2024-02-25 DIAGNOSIS — E1122 Type 2 diabetes mellitus with diabetic chronic kidney disease: Secondary | ICD-10-CM | POA: Diagnosis not present

## 2024-02-25 DIAGNOSIS — G4733 Obstructive sleep apnea (adult) (pediatric): Secondary | ICD-10-CM | POA: Insufficient documentation

## 2024-02-25 DIAGNOSIS — I10 Essential (primary) hypertension: Secondary | ICD-10-CM | POA: Diagnosis not present

## 2024-02-25 DIAGNOSIS — E785 Hyperlipidemia, unspecified: Secondary | ICD-10-CM | POA: Insufficient documentation

## 2024-02-25 LAB — BASIC METABOLIC PANEL WITH GFR
Anion gap: 5 (ref 5–15)
BUN: 10 mg/dL (ref 6–20)
CO2: 26 mmol/L (ref 22–32)
Calcium: 9 mg/dL (ref 8.9–10.3)
Chloride: 102 mmol/L (ref 98–111)
Creatinine, Ser: 1.1 mg/dL (ref 0.61–1.24)
GFR, Estimated: >60 mL/min (ref >60)
Glucose, Bld: 325 mg/dL — ABNORMAL HIGH (ref 70–99)
Potassium: 4.6 mmol/L (ref 3.5–5.1)
Sodium: 133 mmol/L — ABNORMAL LOW (ref 135–145)

## 2024-02-25 LAB — BRAIN NATRIURETIC PEPTIDE: B Natriuretic Peptide: 13 pg/mL (ref 0.0–100.0)

## 2024-02-25 NOTE — Patient Instructions (Signed)
 Thank you for coming in today  If you had labs drawn today, any labs that are abnormal the clinic will call you No news is good news  Medications: No changes  Follow up appointments:  Your physician recommends that you schedule a follow-up appointment in:  6 months With Dr. Mitzie Anda Please call our office to schedule the follow-up appointment in October for December 2025   Do the following things EVERYDAY: Weigh yourself in the morning before breakfast. Write it down and keep it in a log. Take your medicines as prescribed Eat low salt foods--Limit salt (sodium) to 2000 mg per day.  Stay as active as you can everyday Limit all fluids for the day to less than 2 liters   At the [facility], you and your health needs are our priority. As part of our continuing mission to provide you with exceptional heart care, we have created designated Provider Care Teams. These Care Teams include your primary Cardiologist (physician) and Advanced Practice Providers (APPs- Physician Assistants and Nurse Practitioners) who all work together to provide you with the care you need, when you need it.   You may see any of the following providers on your designated Care Team at your next follow up: Dr Jules Oar Dr Peder Bourdon Dr. Mimi Alt, NP Ruddy Corral, [place] [facility] [place], [place] Dennise Fitz, NP Luster Salters, PharmD   Please be sure to bring in all your medications bottles to every appointment.    Thank you for choosing [facility]  If you have any questions or concerns before your next appointment please send us  a message through [place] or call our office at [phone].    TO LEAVE A MESSAGE FOR THE NURSE SELECT OPTION 2, PLEASE LEAVE A MESSAGE INCLUDING: YOUR NAME DATE OF BIRTH CALL BACK NUMBER REASON FOR CALL**this is important as we prioritize the call backs  YOU WILL RECEIVE A CALL BACK  THE SAME DAY AS LONG AS YOU CALL BEFORE 4:00 PM

## 2024-02-25 NOTE — Progress Notes (Signed)
 PCP: Darlis Eisenmenger, MD HF Cardiology: Dr. Mitzie Anda  53 y.o. with history of HTN, DM, OSA on CPAP, and nonischemic cardiomyopathy was referred by Marlyse Single for evaluation of CHF.  Patient was admitted in 11/20 with dyspnea and leg swelling. He was found to have CHF.  Echo in 11/20 showed EF 20-25%.  LHC/RHC showed elevated filling pressures, CI 2.0, and mild nonobstructive CAD. He was started on a regimen of cardiac meds.  Cardiac MRI in 1/21 showed EF still low at 21% with severely dilated LV.  There was no late gadolinium enhancement.    Echo in 7/21 showed EF 30-35%, normal RV. Echo in 2/22 showed EF up to 40-45%.    Echo in 6/23 showed EF 45%, moderate LVH, normal RV.   Echo 12/24 EF 50-55%, normal RV, normal IVC.   Today he returns for HF follow up. Overall feeling fine. No SOB with activity or work duties, works as a Merchandiser, retail in [employer]. Denies palpitations, abnormal bleeding, CP, dizziness, edema, or PND/Orthopnea. Appetite ok.  Weight at home 312 pounds. Taking all medications.  Wears CPAP. No tobacco, ETOH or drug use.  ECG (personally reviewed):  none ordered today.  Labs (1/24): BNP 10, K 4.2, creatinine 1.19 Labs (8/24): LDL 48 Labs (9/24): BNP 8.5, creatinine 1.25, K 3.7 Labs (3/25): K 4.6, creatinine 1.20  PMH: 1. Type 2 diabetes 2. HTN 3. Hyperlipidemia 4. OSA: Uses CPAP.  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  6. CKD stage 3 7. Chronic systolic CHF: Nonischemic cardiomyopathy.  - Echo (11/20): EF 20-25%, mild-moderate MR.  - LHC (11/20): Nonobstructive CAD; mean RA 10, PA 60/30 mean 40, mean PCWP 27, CI 2.0.  - Cardiac MRI (1/21): Severe LV enlargement with EF 21%, global hypokinesis, RV EF 33%, no LGE.  - Echo (7/21): EF 30-35%, normal RV.  - Echo (2/22): EF 40-45%, normal RV.  - Echo (6/23): EF 45%, moderate LVH, normal RV - Echo (12/24): EF 50-55%, normal RV, normal IVC.  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  No ETOH, no  smoking.   FH: Mother with "heart issues," died when he was born.  No other known heart disease.   ROS: All systems reviewed and negative except as per HPI.   Current Outpatient Medications  Medication Sig Dispense Refill   aspirin  81 MG chewable tablet Chew 81 mg by mouth daily.     atorvastatin  (LIPITOR) 40 MG tablet Take 1 tablet (40 mg total) by mouth at bedtime. 90 tablet 3   carvedilol  (COREG ) 25 MG tablet Take 1 tablet (25 mg total) by mouth 2 (two) times daily. 180 tablet 3   furosemide  (LASIX ) 40 MG tablet Take 1 tablet (40 mg total) by mouth daily. 90 tablet 3   hydrALAZINE  (APRESOLINE ) 50 MG tablet Take 1 tablet (50 mg total) by mouth 3 (three) times daily. 220 tablet 4   isosorbide  mononitrate (IMDUR ) 30 MG 24 hr tablet Take 2 tablets (60 mg total) by mouth daily. 90 tablet 3   metFORMIN (GLUCOPHAGE) 1000 MG tablet Take 1,000 mg by mouth 2 (two) times daily with a meal.      Multiple Vitamin (MULTIVITAMIN WITH MINERALS) TABS tablet Take 1 tablet by mouth daily.     sacubitril -valsartan  (ENTRESTO ) 97-103 MG Take 1 tablet by mouth 2 (two) times daily. 60 tablet 6   spironolactone  (ALDACTONE ) 25 MG tablet Take 1 tablet (25 mg total) by mouth daily. 90 tablet 3  Tirzepatide (MOUNJARO [place]) Inject 5 mg into the skin once a week.     No current facility-administered medications for this encounter.   Wt Readings from Last 3 Encounters:  02/25/24 (!) 145.6 kg (321 lb)  08/27/23 (!) 147.2 kg (324 lb 9.6 oz)  06/01/23 (!) 147.3 kg (324 lb 11.8 oz)   BP 110/78 (BP Location: Left Arm, Patient Position: Sitting)   Pulse 88   Ht 6' (1.829 m)   Wt (!) 145.6 kg (321 lb)   SpO2 96%   BMI 43.54 kg/m  Physical Exam General:  NAD. No resp difficulty, walked into clinic HEENT: Normal Neck: Supple. No JVD. Thick neck Cor: Regular rate & rhythm. No rubs, gallops or murmurs. Lungs: Clear Abdomen: Soft, nontender, nondistended.  Extremities: No cyanosis, clubbing, rash, edema Neuro: Alert  & oriented x 3, moves all 4 extremities w/o difficulty. Affect pleasant.  Assessment/Plan: 1. Chronic systolic CHF:  Nonischemic cardiomyopathy. No definite family history of cardiomyopathy.  No ETOH or drugs. Cath with nonobstructive disease in 11/20.  Cardiac MRI in 1/21 showed EF 21% and severe LV dilation, no LGE. Possible viral myocarditis versus diabetic cardiomyopathy.  BP does not seem to have been markedly high in the past so doubt that HTN explains CMP.  Echo in 2/22 showed EF up to 40-45%.  Echo in 6/23 with EF up to 45%, moderate LVH, normal RV.  Echo 12/24: EF up again to 50-55% with normal RV. He does not look volume overloaded on exam, NYHA class I symptoms and working full time.   - Continue Entresto  97/103 bid.  - Continue Lasix  40 mg daily.  BMET/BNP today.   - Continue Coreg  25 mg bid.  - Continue spironolactone  25 mg daily.  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  - Continue hydralazine  50 mg tid and Imdur  60 mg daily.  2. CKD: Stage 3, likely due to DM and HTN.   - BMET today.  3. HTN: BP controlled.  - Continue current meds. 4. OSA: Continue to use CPAP.  - no change. 5. Type 2 diabetes: Off Jardiance due to GU infections.   - PCP managing. He is on GLP1. 6. Hyperlipidemia: On atorvastatin . Good lipids 9/24.  Follow up in 6 months with Dr. Mitzie Anda.  Arlice Bene [facility] FNP-BC 02/25/2024

## 2024-02-26 NOTE — Telephone Encounter (Addendum)
 Pt aware, agreeable, and verbalized understanding   ----- Message from Elmarie Hacking sent at 02/25/2024  1:20 PM EDT ----- Labs stable but glucose very elevated. Needs to follow up with PCP regarding glucose control

## 2024-03-10 ENCOUNTER — Other Ambulatory Visit (HOSPITAL_COMMUNITY): Payer: Self-pay

## 2024-03-10 ENCOUNTER — Other Ambulatory Visit: Payer: Self-pay

## 2024-04-15 ENCOUNTER — Other Ambulatory Visit: Payer: Self-pay

## 2024-05-27 ENCOUNTER — Other Ambulatory Visit (HOSPITAL_COMMUNITY): Payer: Self-pay | Admitting: Cardiology

## 2024-05-27 ENCOUNTER — Other Ambulatory Visit (HOSPITAL_COMMUNITY): Payer: Self-pay

## 2024-05-28 ENCOUNTER — Other Ambulatory Visit (HOSPITAL_COMMUNITY): Payer: Self-pay

## 2024-05-28 ENCOUNTER — Other Ambulatory Visit: Payer: Self-pay

## 2024-05-28 MED ORDER — SPIRONOLACTONE 25 MG PO TABS
25.0000 mg | ORAL_TABLET | Freq: Every day | ORAL | 3 refills | Status: AC
  Filled 2024-05-28: qty 30, 30d supply, fill #0
  Filled 2024-07-02: qty 30, 30d supply, fill #1
  Filled 2024-08-17 – 2024-09-25 (×2): qty 30, 30d supply, fill #2
  Filled 2024-10-20: qty 30, 30d supply, fill #3

## 2024-05-29 ENCOUNTER — Other Ambulatory Visit (HOSPITAL_COMMUNITY): Payer: Self-pay

## 2024-07-02 ENCOUNTER — Other Ambulatory Visit (HOSPITAL_COMMUNITY): Payer: Self-pay | Admitting: Cardiology

## 2024-07-03 ENCOUNTER — Other Ambulatory Visit: Payer: Self-pay

## 2024-07-03 ENCOUNTER — Other Ambulatory Visit (HOSPITAL_COMMUNITY): Payer: Self-pay

## 2024-07-03 MED ORDER — SACUBITRIL-VALSARTAN 97-103 MG PO TABS
1.0000 | ORAL_TABLET | Freq: Two times a day (BID) | ORAL | 6 refills | Status: AC
  Filled 2024-07-03: qty 60, 30d supply, fill #0
  Filled 2024-08-05: qty 60, 30d supply, fill #1
  Filled 2024-09-08: qty 60, 30d supply, fill #2
  Filled 2024-10-20: qty 60, 30d supply, fill #3

## 2024-07-04 ENCOUNTER — Other Ambulatory Visit (HOSPITAL_COMMUNITY): Payer: Self-pay

## 2024-08-05 ENCOUNTER — Other Ambulatory Visit (HOSPITAL_COMMUNITY): Payer: Self-pay | Admitting: Cardiology

## 2024-08-05 ENCOUNTER — Other Ambulatory Visit (HOSPITAL_COMMUNITY): Payer: Self-pay

## 2024-08-05 MED ORDER — HYDRALAZINE HCL 50 MG PO TABS
50.0000 mg | ORAL_TABLET | Freq: Three times a day (TID) | ORAL | 4 refills | Status: AC
Start: 2024-08-05 — End: ?
  Filled 2024-08-05: qty 90, 30d supply, fill #0
  Filled 2024-09-25: qty 90, 30d supply, fill #1

## 2024-08-28 ENCOUNTER — Other Ambulatory Visit (HOSPITAL_COMMUNITY): Payer: Self-pay

## 2024-09-08 ENCOUNTER — Other Ambulatory Visit (HOSPITAL_COMMUNITY): Payer: Self-pay

## 2024-09-16 ENCOUNTER — Other Ambulatory Visit (HOSPITAL_COMMUNITY): Payer: Self-pay

## 2024-09-16 ENCOUNTER — Other Ambulatory Visit (HOSPITAL_COMMUNITY): Payer: Self-pay | Admitting: Cardiology

## 2024-09-16 ENCOUNTER — Other Ambulatory Visit: Payer: Self-pay

## 2024-09-16 MED ORDER — ISOSORBIDE MONONITRATE ER 30 MG PO TB24
60.0000 mg | ORAL_TABLET | Freq: Every day | ORAL | 3 refills | Status: AC
  Filled 2024-09-16: qty 90, 45d supply, fill #0

## 2024-10-08 ENCOUNTER — Telehealth (HOSPITAL_COMMUNITY): Payer: Self-pay | Admitting: Cardiology

## 2024-10-08 NOTE — Telephone Encounter (Signed)
 Called to confirm/remind patient of their appointment at the [facility] on 10/08/24.   Appointment:   [x] Confirmed  [] Left mess   [] No answer/No voice mail  [] VM Full/unable to leave message  [] Phone not in service  Patient reminded to bring all medications and/or complete list.  Confirmed patient has transportation. Gave directions, instructed to utilize valet parking.

## 2024-10-09 ENCOUNTER — Ambulatory Visit (HOSPITAL_COMMUNITY)
Admission: RE | Admit: 2024-10-09 | Discharge: 2024-10-09 | Disposition: A | Discharge status: Home / Self Care | Source: Ambulatory Visit | Attending: Cardiology | Admitting: Cardiology

## 2024-10-09 ENCOUNTER — Ambulatory Visit (HOSPITAL_COMMUNITY): Payer: Self-pay | Admitting: Cardiology

## 2024-10-09 VITALS — BP 120/78 | HR 81 | Wt 321.0 lb

## 2024-10-09 DIAGNOSIS — Z79899 Other long term (current) drug therapy: Secondary | ICD-10-CM | POA: Insufficient documentation

## 2024-10-09 DIAGNOSIS — I5022 Chronic systolic (congestive) heart failure: Secondary | ICD-10-CM | POA: Diagnosis not present

## 2024-10-09 DIAGNOSIS — G4733 Obstructive sleep apnea (adult) (pediatric): Secondary | ICD-10-CM | POA: Insufficient documentation

## 2024-10-09 DIAGNOSIS — E1122 Type 2 diabetes mellitus with diabetic chronic kidney disease: Secondary | ICD-10-CM | POA: Diagnosis not present

## 2024-10-09 DIAGNOSIS — Z7982 Long term (current) use of aspirin: Secondary | ICD-10-CM | POA: Insufficient documentation

## 2024-10-09 DIAGNOSIS — I10 Essential (primary) hypertension: Secondary | ICD-10-CM | POA: Diagnosis present

## 2024-10-09 DIAGNOSIS — Z7985 Long-term (current) use of injectable non-insulin antidiabetic drugs: Secondary | ICD-10-CM | POA: Diagnosis not present

## 2024-10-09 DIAGNOSIS — E785 Hyperlipidemia, unspecified: Secondary | ICD-10-CM | POA: Diagnosis not present

## 2024-10-09 DIAGNOSIS — I130 Hypertensive heart and chronic kidney disease with heart failure and stage 1 through stage 4 chronic kidney disease, or unspecified chronic kidney disease: Secondary | ICD-10-CM | POA: Insufficient documentation

## 2024-10-09 DIAGNOSIS — N183 Chronic kidney disease, stage 3 unspecified: Secondary | ICD-10-CM | POA: Insufficient documentation

## 2024-10-09 DIAGNOSIS — I428 Other cardiomyopathies: Secondary | ICD-10-CM | POA: Diagnosis not present

## 2024-10-09 DIAGNOSIS — E119 Type 2 diabetes mellitus without complications: Secondary | ICD-10-CM | POA: Diagnosis present

## 2024-10-09 LAB — BASIC METABOLIC PANEL WITH GFR
Anion gap: 10 (ref 5–15)
BUN: 13 mg/dL (ref 6–20)
CO2: 28 mmol/L (ref 22–32)
Calcium: 9.3 mg/dL (ref 8.9–10.3)
Chloride: 100 mmol/L (ref 98–111)
Creatinine, Ser: 1.14 mg/dL (ref 0.61–1.24)
GFR, Estimated: >60 mL/min
Glucose, Bld: 266 mg/dL — ABNORMAL HIGH (ref 70–99)
Potassium: 4.3 mmol/L (ref 3.5–5.1)
Sodium: 137 mmol/L (ref 135–145)

## 2024-10-09 LAB — LIPID PANEL
Cholesterol: 110 mg/dL (ref 0–200)
HDL: 44 mg/dL
LDL Cholesterol: 45 mg/dL (ref 0–99)
Total CHOL/HDL Ratio: 2.5 ratio
Triglycerides: 105 mg/dL
VLDL: 21 mg/dL (ref 0–40)

## 2024-10-09 LAB — PRO BRAIN NATRIURETIC PEPTIDE: Pro Brain Natriuretic Peptide: <50 pg/mL

## 2024-10-09 MED ORDER — FUROSEMIDE 40 MG PO TABS: 20.0000 mg | ORAL_TABLET | ORAL | 3 refills | Status: AC | PRN

## 2024-10-09 NOTE — Patient Instructions (Signed)
 CHANGE Lasix  to 20 mg daily. Can stop after a few weeks IF THERE IS NO SWELLING OR WEIGHT GAIN.  Labs done today, your results will be available in MyChart, we will contact you for abnormal readings.  REPEAT blood work in 3 months.  Your physician has requested that you have an echocardiogram. Echocardiography is a painless test that uses sound waves to create images of your heart. It provides your doctor with information about the size and shape of your heart and how well your hearts chambers and valves are working. This procedure takes approximately one hour. There are no restrictions for this procedure. Please do NOT wear cologne, perfume, aftershave, or lotions (deodorant is allowed). Please arrive 15 minutes prior to your appointment time.  Please note: We ask at that you not bring children with you during ultrasound (echo/ vascular) testing. Due to room size and safety concerns, children are not allowed in the ultrasound rooms during exams. Our front office staff cannot provide observation of children in our lobby area while testing is being conducted. An adult accompanying a patient to their appointment will only be allowed in the ultrasound room at the discretion of the ultrasound technician under special circumstances. We apologize for any inconvenience.  Your physician recommends that you schedule a follow-up appointment in: 6 months.  If you have any questions or concerns before your next appointment please send us  a message through [place] or call our office at [phone].    TO LEAVE A MESSAGE FOR THE NURSE SELECT OPTION 2, PLEASE LEAVE A MESSAGE INCLUDING: YOUR NAME DATE OF BIRTH CALL BACK NUMBER REASON FOR CALL**this is important as we prioritize the call backs  YOU WILL RECEIVE A CALL BACK THE SAME DAY AS LONG AS YOU CALL BEFORE 4:00 PM  At the [facility], you and your health needs are our priority. As part of our continuing mission to provide you with  exceptional heart care, we have created designated Provider Care Teams. These Care Teams include your primary Cardiologist (physician) and Advanced Practice Providers (APPs- Physician Assistants and Nurse Practitioners) who all work together to provide you with the care you need, when you need it.   You may see any of the following providers on your designated Care Team at your next follow up: Dr Toribio Fuel Dr Ezra Shuck Dr. Morene Brownie Greig Mosses, NP Caffie Shed, [place] [facility] [place], [place] Beckey Coe, NP Jordan Lee, NP Ellouise Class, NP Tinnie Redman, PharmD Jaun Bash, PharmD   Please be sure to bring in all your medications bottles to every appointment.    Thank you for choosing [facility]

## 2024-10-09 NOTE — Progress Notes (Signed)
 PCP: Rolan Ezra RAMAN, MD HF Cardiology: Dr. Rolan  Chief complaint: CHF  54 y.o. with history of HTN, DM, OSA on CPAP, and nonischemic cardiomyopathy was referred by Glendia Ferrier for evaluation of CHF.  Patient was admitted in 11/20 with dyspnea and leg swelling. He was found to have CHF.  Echo in 11/20 showed EF 20-25%.  LHC/RHC showed elevated filling pressures, CI 2.0, and mild nonobstructive CAD. He was started on a regimen of cardiac meds.  Cardiac MRI in 1/21 showed EF still low at 21% with severely dilated LV.  There was no late gadolinium enhancement.    Echo in 7/21 showed EF 30-35%, normal RV. Echo in 2/22 showed EF up to 40-45%.    Echo in 6/23 showed EF 45%, moderate LVH, normal RV.   Echo 12/24 showed EF 50-55%, normal RV, normal IVC.   Today he returns for HF follow up. Still taking Lasix  40 mg daily.  Weight is stable.  He is on tirzepatide but has not been able to increase it to the goal dose.  He is using CPAP regularly.  Working full time.  No exertional dyspnea or chest pain.  No orthopnea/PND.  No lightheadedness.   ECG (personally reviewed):  NSR, PACs, LAFB  Labs (1/24): BNP 10, K 4.2, creatinine 1.19 Labs (8/24): LDL 48 Labs (9/24): BNP 8.5, creatinine 1.25, K 3.7 Labs (3/25): K 4.6, creatinine 1.20 Labs (6/25): K 4.6, creatinine 1.10, BNP 13  PMH: 1. Type 2 diabetes 2. HTN 3. Hyperlipidemia 4. OSA: Uses CPAP.  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  6. CKD stage 3 7. Chronic systolic CHF: Nonischemic cardiomyopathy.  - Echo (11/20): EF 20-25%, mild-moderate MR.  - LHC (11/20): Nonobstructive CAD; mean RA 10, PA 60/30 mean 40, mean PCWP 27, CI 2.0.  - Cardiac MRI (1/21): Severe LV enlargement with EF 21%, global hypokinesis, RV EF 33%, no LGE.  - Echo (7/21): EF 30-35%, normal RV.  - Echo (2/22): EF 40-45%, normal RV.  - Echo (6/23): EF 45%, moderate LVH, normal RV - Echo (12/24): EF 50-55%, normal RV, normal IVC.  SH: Married.  Used to play arena football.  No ETOH, no smoking. Works full time as a merchandiser, retail.   FH: Mother with heart issues, died when he was born.  No other known heart disease.   ROS: All systems reviewed and negative except as per HPI.   Current Outpatient Medications  Medication Sig Dispense Refill   aspirin  81 MG chewable tablet Chew 81 mg by mouth daily.     atorvastatin  (LIPITOR) 40 MG tablet Take 1 tablet (40 mg total) by mouth at bedtime. 90 tablet 3   carvedilol  (COREG ) 25 MG tablet Take 1 tablet (25 mg total) by mouth 2 (two) times daily. 180 tablet 3   hydrALAZINE  (APRESOLINE ) 50 MG tablet Take 1 tablet (50 mg total) by mouth 3 (three) times daily. 220 tablet 4   isosorbide  mononitrate (IMDUR ) 30 MG 24 hr tablet Take 2 tablets (60 mg total) by mouth daily. PLEASE SCHEDULE APPOINTMENT FOR MORE REFILLS [phone] OPTION 2 90 tablet 3   Multiple Vitamin (MULTIVITAMIN WITH MINERALS) TABS tablet Take 1 tablet by mouth daily.     sacubitril -valsartan  (ENTRESTO ) 97-103 MG Take 1 tablet by mouth 2 (two) times daily. 60 tablet 6   spironolactone  (ALDACTONE ) 25 MG tablet Take 1 tablet (25 mg total) by mouth daily. 90 tablet 3   Tirzepatide (MOUNJARO [place]) Inject 5 mg into  the skin once a week.     furosemide  (LASIX ) 40 MG tablet Take 0.5 tablets (20 mg total) by mouth as needed. For weight gain of 3 lb in 24 hours or 5 lb in a week, and swelling 60 tablet 3   No current facility-administered medications for this encounter.   Wt Readings from Last 3 Encounters:  10/09/24 (!) 145.6 kg (321 lb)  02/25/24 (!) 145.6 kg (321 lb)  08/27/23 (!) 147.2 kg (324 lb 9.6 oz)   BP 120/78   Pulse 81   Wt (!) 145.6 kg (321 lb)   SpO2 97%   BMI 43.54 kg/m  General: NAD Neck: No JVD, no thyromegaly or thyroid nodule.  Lungs: Clear to auscultation bilaterally with normal respiratory effort. CV: Nondisplaced PMI.  Heart regular S1/S2, no S3/S4, no murmur.  No peripheral edema.  No carotid bruit.  Normal  pedal pulses.  Abdomen: Soft, nontender, no hepatosplenomegaly, no distention.  Skin: Intact without lesions or rashes.  Neurologic: Alert and oriented x 3.  Psych: Normal affect. Extremities: No clubbing or cyanosis.  HEENT: Normal.   Assessment/Plan: 1. Chronic systolic CHF:  Nonischemic cardiomyopathy. No definite family history of cardiomyopathy.  No ETOH or drugs. Cath with nonobstructive disease in 11/20.  Cardiac MRI in 1/21 showed EF 21% and severe LV dilation, no LGE. Possible viral myocarditis versus diabetic cardiomyopathy.  BP does not seem to have been markedly high in the past so doubt that HTN explains CMP.  Echo in 2/22 showed EF up to 40-45%.  Echo in 6/23 with EF up to 45%, moderate LVH, normal RV.  Echo 12/24 showed EF up again to 50-55% with normal RV. NYHA class I, not volume overloaded on exam. - I will get echo this year to make sure EF remains recovered.   - Continue Entresto  97/103 bid.  - Decrease Lasix  to 20 mg daily then stop after a couple of weeks if no fluid retention/weight gain.  I suspect that he does not need Lasix .   - Continue Coreg  25 mg bid.  - Continue spironolactone  25 mg daily. BMET today and repeat q3-4 months.  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  - Continue hydralazine  50 mg tid and Imdur  60 mg daily.  2. CKD: Stage 3, likely due to DM and HTN.   - BMET today.  3. HTN: BP controlled on current medication regimen.  - Continue current meds. 4. OSA: Continue to use CPAP.  5. Type 2 diabetes: Off Jardiance due to GU infections.   - He is on GLP1. 6. Hyperlipidemia: On atorvastatin . Check lipids today.   Follow up in 6 months with APP  I spent 31 minutes reviewing records, interviewing/examining patient, and managing orders.   Ezra Shuck  10/09/2024

## 2024-11-05 ENCOUNTER — Ambulatory Visit (HOSPITAL_COMMUNITY)

## 2025-01-07 ENCOUNTER — Ambulatory Visit (HOSPITAL_COMMUNITY)

## 2025-04-08 ENCOUNTER — Ambulatory Visit (HOSPITAL_COMMUNITY)
